# Patient Record
Sex: Female | Born: 1985
Health system: Southern US, Community
[De-identification: ages and names within clinical notes are randomized; demographics above are authoritative.]

## PROBLEM LIST (undated history)

## (undated) ENCOUNTER — Inpatient Hospital Stay (HOSPITAL_COMMUNITY): Payer: Self-pay

## (undated) DIAGNOSIS — Z9889 Other specified postprocedural states: Secondary | ICD-10-CM

## (undated) DIAGNOSIS — R112 Nausea with vomiting, unspecified: Secondary | ICD-10-CM

## (undated) DIAGNOSIS — K219 Gastro-esophageal reflux disease without esophagitis: Secondary | ICD-10-CM

## (undated) DIAGNOSIS — Z9289 Personal history of other medical treatment: Secondary | ICD-10-CM

## (undated) DIAGNOSIS — O1420 HELLP syndrome (HELLP), unspecified trimester: Secondary | ICD-10-CM

## (undated) DIAGNOSIS — R519 Headache, unspecified: Secondary | ICD-10-CM

## (undated) DIAGNOSIS — R51 Headache: Secondary | ICD-10-CM

## (undated) HISTORY — DX: Gastro-esophageal reflux disease without esophagitis: K21.9

---

## 2012-08-23 ENCOUNTER — Emergency Department (HOSPITAL_COMMUNITY)
Admission: EM | Admit: 2012-08-23 | Discharge: 2012-08-23 | Disposition: A | Discharge status: Home / Self Care | Payer: Self-pay | Attending: Emergency Medicine | Admitting: Emergency Medicine

## 2012-08-23 ENCOUNTER — Emergency Department (HOSPITAL_COMMUNITY): Payer: Self-pay

## 2012-08-23 ENCOUNTER — Encounter (HOSPITAL_COMMUNITY): Payer: Self-pay | Admitting: Family Medicine

## 2012-08-23 DIAGNOSIS — R1013 Epigastric pain: Secondary | ICD-10-CM

## 2012-08-23 DIAGNOSIS — K760 Fatty (change of) liver, not elsewhere classified: Secondary | ICD-10-CM

## 2012-08-23 DIAGNOSIS — I10 Essential (primary) hypertension: Secondary | ICD-10-CM | POA: Insufficient documentation

## 2012-08-23 DIAGNOSIS — K7689 Other specified diseases of liver: Secondary | ICD-10-CM | POA: Insufficient documentation

## 2012-08-23 LAB — URINALYSIS, ROUTINE W REFLEX MICROSCOPIC
Bilirubin Urine: NEGATIVE
Glucose, UA: NEGATIVE mg/dL
Ketones, ur: NEGATIVE mg/dL
Nitrite: NEGATIVE
Protein, ur: NEGATIVE mg/dL
Specific Gravity, Urine: 1.02 (ref 1.005–1.030)
Urobilinogen, UA: 1 mg/dL (ref 0.0–1.0)
pH: 6 (ref 5.0–8.0)

## 2012-08-23 LAB — COMPREHENSIVE METABOLIC PANEL
ALT: 120 U/L — ABNORMAL HIGH (ref 0–35)
AST: 72 U/L — ABNORMAL HIGH (ref 0–37)
Albumin: 4.2 g/dL (ref 3.5–5.2)
Alkaline Phosphatase: 77 U/L (ref 39–117)
BUN: 9 mg/dL (ref 6–23)
CO2: 26 mEq/L (ref 19–32)
Calcium: 9.5 mg/dL (ref 8.4–10.5)
Chloride: 105 mEq/L (ref 96–112)
Creatinine, Ser: 0.56 mg/dL (ref 0.50–1.10)
GFR calc Af Amer: >90 mL/min (ref >90)
GFR calc non Af Amer: >90 mL/min (ref >90)
Glucose, Bld: 77 mg/dL (ref 70–99)
Potassium: 3.5 mEq/L (ref 3.5–5.1)
Sodium: 141 mEq/L (ref 135–145)
Total Bilirubin: 0.7 mg/dL (ref 0.3–1.2)
Total Protein: 7.9 g/dL (ref 6.0–8.3)

## 2012-08-23 LAB — CBC WITH DIFFERENTIAL/PLATELET
Basophils Absolute: 0.1 10*3/uL (ref 0.0–0.1)
Basophils Relative: 1 % (ref 0–1)
Eosinophils Absolute: 0.4 10*3/uL (ref 0.0–0.7)
Eosinophils Relative: 5 % (ref 0–5)
HCT: 42.1 % (ref 36.0–46.0)
Hemoglobin: 15.2 g/dL — ABNORMAL HIGH (ref 12.0–15.0)
Lymphocytes Relative: 37 % (ref 12–46)
Lymphs Abs: 2.7 10*3/uL (ref 0.7–4.0)
MCH: 31.9 pg (ref 26.0–34.0)
MCHC: 36.1 g/dL — ABNORMAL HIGH (ref 30.0–36.0)
MCV: 88.4 fL (ref 78.0–100.0)
Monocytes Absolute: 0.4 10*3/uL (ref 0.1–1.0)
Monocytes Relative: 5 % (ref 3–12)
Neutro Abs: 3.8 10*3/uL (ref 1.7–7.7)
Neutrophils Relative %: 52 % (ref 43–77)
Platelets: 304 10*3/uL (ref 150–400)
RBC: 4.76 MIL/uL (ref 3.87–5.11)
RDW: 12.8 % (ref 11.5–15.5)
WBC: 7.2 10*3/uL (ref 4.0–10.5)

## 2012-08-23 LAB — URINE MICROSCOPIC-ADD ON

## 2012-08-23 LAB — PREGNANCY, URINE: Preg Test, Ur: NEGATIVE

## 2012-08-23 LAB — LIPASE, BLOOD: Lipase: 33 U/L (ref 11–59)

## 2012-08-23 MED ORDER — TRIAMCINOLONE ACETONIDE 0.1 % EX CREA: TOPICAL_CREAM | Freq: Two times a day (BID) | CUTANEOUS | Status: AC

## 2012-08-23 MED ORDER — ONDANSETRON HCL 4 MG/2ML IJ SOLN
4.0000 mg | Freq: Once | INTRAMUSCULAR | Status: AC
  Administered 2012-08-23: 4 mg via INTRAVENOUS
  Filled 2012-08-23: qty 2

## 2012-08-23 MED ORDER — SUCRALFATE 1 G PO TABS: 1.0000 g | ORAL_TABLET | Freq: Four times a day (QID) | ORAL | Status: DC

## 2012-08-23 MED ORDER — FAMOTIDINE IN NACL 20-0.9 MG/50ML-% IV SOLN
20.0000 mg | Freq: Once | INTRAVENOUS | Status: AC
  Administered 2012-08-23: 20 mg via INTRAVENOUS
  Filled 2012-08-23: qty 50

## 2012-08-23 MED ORDER — OMEPRAZOLE 20 MG PO CPDR: 20.0000 mg | DELAYED_RELEASE_CAPSULE | Freq: Every day | ORAL | Status: DC

## 2012-08-23 MED ORDER — GI COCKTAIL ~~LOC~~
30.0000 mL | Freq: Once | ORAL | Status: AC
  Administered 2012-08-23: 30 mL via ORAL
  Filled 2012-08-23: qty 30

## 2012-08-23 NOTE — ED Notes (Signed)
Pt husband at bedside

## 2012-08-23 NOTE — ED Notes (Signed)
Pt complaining of upper abdominal and epigastric pain with N,V. sts every time she eats it comes back up.

## 2012-08-23 NOTE — ED Provider Notes (Signed)
Medical screening examination/treatment/procedure(s) were performed by non-physician practitioner and as supervising physician I was immediately available for consultation/collaboration.   Carleene Cooper III, MD 08/23/12 [phone]

## 2012-08-23 NOTE — ED Notes (Signed)
Pt amb to BR without problem for specimen

## 2012-08-23 NOTE — ED Provider Notes (Signed)
History     CSN: [id number]  Arrival date & time 08/23/12  1123   First MD Initiated Contact with Patient 08/23/12 1139      Chief Complaint  Patient presents with  . Abdominal Pain    (Consider location/radiation/quality/duration/timing/severity/associated sxs/prior treatment) Patient is a 26 y.o. female presenting with abdominal pain. The history is provided by the patient.  Abdominal Pain The primary symptoms of the illness include abdominal pain, nausea and vomiting. The primary symptoms of the illness do not include fever, shortness of breath, diarrhea, hematemesis, dysuria or vaginal discharge. The current episode started more than 2 days ago. The problem has been gradually worsening.  Symptoms associated with the illness do not include chills, hematuria or back pain. Significant associated medical issues include GERD.  PT states she has hx of GERD, has been taking H2 blockers, states that pain worsened in the last week. States medications not helping. Pain worsened with eating, pain in epigastric region. Associated nausea, and vomiting. No problems with bowels. No fever. No other complaints   Past Medical History  Diagnosis Date  . Hypertension     History reviewed. No pertinent past surgical history.  History reviewed. No pertinent family history.  History  Substance Use Topics  . Smoking status: Never Smoker   . Smokeless tobacco: Not on file  . Alcohol Use: No    OB History    Grav Para Term Preterm Abortions TAB SAB Ect Mult Living                  Review of Systems  Constitutional: Negative for fever and chills.  HENT: Negative for neck pain and neck stiffness.   Respiratory: Negative for chest tightness and shortness of breath.   Cardiovascular: Negative for chest pain and leg swelling.  Gastrointestinal: Positive for nausea, vomiting and abdominal pain. Negative for diarrhea and hematemesis.  Genitourinary: Negative for dysuria, hematuria, flank pain and  vaginal discharge.  Musculoskeletal: Negative for myalgias and back pain.  Skin: Negative.   Neurological: Negative for dizziness, weakness and numbness.    Allergies  Review of patient's allergies indicates no known allergies.  Home Medications  No current outpatient prescriptions on file.  BP 146/105  Pulse 108  Temp 98.3 F (36.8 C) (Oral)  Resp 18  SpO2 98%  LMP 08/23/2012  Physical Exam  Nursing note and vitals reviewed. Constitutional: She is oriented to person, place, and time. She appears well-developed and well-nourished. No distress.  HENT:  Head: Normocephalic.  Eyes: Conjunctivae normal are normal.  Neck: Neck supple.  Cardiovascular: Normal rate, regular rhythm and normal heart sounds.   Pulmonary/Chest: Effort normal and breath sounds normal. No respiratory distress. She has no wheezes.  Abdominal: Soft. Bowel sounds are normal. She exhibits no distension. There is tenderness. There is no rebound and no guarding.       RUQ and epigastric tenderness  Musculoskeletal: She exhibits no edema.  Neurological: She is alert and oriented to person, place, and time.  Skin: Skin is warm and dry.       About 4cm area erythema, scaling to the right forearm  Psychiatric: She has a normal mood and affect.    ED Course  Procedures (including critical care time)  Pt with epigastric pain for "months" worsened in last week. Will try GI cocktail, pepcid, labs pending.   Results for orders placed during the hospital encounter of 08/23/12  CBC WITH DIFFERENTIAL      Component Value Range  WBC 7.2  4.0 - 10.5 K/uL   RBC 4.76  3.87 - 5.11 MIL/uL   Hemoglobin 15.2 (*) 12.0 - 15.0 g/dL   HCT 16.1  09.6 - 04.5 %   MCV 88.4  78.0 - 100.0 fL   MCH 31.9  26.0 - 34.0 pg   MCHC 36.1 (*) 30.0 - 36.0 g/dL   RDW 40.9  81.1 - 91.4 %   Platelets 304  150 - 400 K/uL   Neutrophils Relative 52  43 - 77 %   Neutro Abs 3.8  1.7 - 7.7 K/uL   Lymphocytes Relative 37  12 - 46 %   Lymphs  Abs 2.7  0.7 - 4.0 K/uL   Monocytes Relative 5  3 - 12 %   Monocytes Absolute 0.4  0.1 - 1.0 K/uL   Eosinophils Relative 5  0 - 5 %   Eosinophils Absolute 0.4  0.0 - 0.7 K/uL   Basophils Relative 1  0 - 1 %   Basophils Absolute 0.1  0.0 - 0.1 K/uL  COMPREHENSIVE METABOLIC PANEL      Component Value Range   Sodium 141  135 - 145 mEq/L   Potassium 3.5  3.5 - 5.1 mEq/L   Chloride 105  96 - 112 mEq/L   CO2 26  19 - 32 mEq/L   Glucose, Bld 77  70 - 99 mg/dL   BUN 9  6 - 23 mg/dL   Creatinine, Ser 7.82  0.50 - 1.10 mg/dL   Calcium 9.5  8.4 - 95.6 mg/dL   Total Protein 7.9  6.0 - 8.3 g/dL   Albumin 4.2  3.5 - 5.2 g/dL   AST 72 (*) 0 - 37 U/L   ALT 120 (*) 0 - 35 U/L   Alkaline Phosphatase 77  39 - 117 U/L   Total Bilirubin 0.7  0.3 - 1.2 mg/dL   GFR calc non Af Amer >90  >90 mL/min   GFR calc Af Amer >90  >90 mL/min  LIPASE, BLOOD      Component Value Range   Lipase 33  11 - 59 U/L  URINALYSIS, ROUTINE W REFLEX MICROSCOPIC      Component Value Range   Color, Urine AMBER (*) YELLOW   APPearance CLOUDY (*) CLEAR   Specific Gravity, Urine 1.020  1.005 - 1.030   pH 6.0  5.0 - 8.0   Glucose, UA NEGATIVE  NEGATIVE mg/dL   Hgb urine dipstick LARGE (*) NEGATIVE   Bilirubin Urine NEGATIVE  NEGATIVE   Ketones, ur NEGATIVE  NEGATIVE mg/dL   Protein, ur NEGATIVE  NEGATIVE mg/dL   Urobilinogen, UA 1.0  0.0 - 1.0 mg/dL   Nitrite NEGATIVE  NEGATIVE   Leukocytes, UA SMALL (*) NEGATIVE  PREGNANCY, URINE      Component Value Range   Preg Test, Ur NEGATIVE  NEGATIVE  URINE MICROSCOPIC-ADD ON      Component Value Range   Squamous Epithelial / LPF RARE  RARE   WBC, UA 3-6  <3 WBC/hpf   RBC / HPF TOO NUMEROUS TO COUNT  <3 RBC/hpf   US Abdomen Complete  08/23/2012  *RADIOLOGY REPORT*  Clinical Data:  Abdominal pain.  The patient ate approximately 3 hours prior to imaging.  COMPLETE ABDOMINAL ULTRASOUND  Comparison:  None.  Findings:  Gallbladder:  Slightly contracted due to the recent meal.  No  shadowing gallstones or echogenic sludge.  No gallbladder wall thickening or pericholecystic fluid.  Negative sonographic Murphy's sign according to  the ultrasound technologist.  Common bile duct:  Normal in caliber with maximum diameter approximating 3 mm.  Liver:  Diffusely increased and coarsened echotexture with focal sparing surrounding the gallbladder.  No focal parenchymal masses. Patent portal vein with hepatopetal flow.  IVC:  Patent.  Pancreas:  Although the pancreas is difficult to visualize in its entirety, no focal pancreatic abnormality is identified.  The head was partially obscured by duodenal bowel gas.  Spleen:  Normal size and echotexture without focal parenchymal abnormality.  Right Kidney:  No hydronephrosis.  Well-preserved cortex.  No shadowing calculi.  Normal size and parenchymal echotexture without focal abnormalities.  Approximately 13.2 cm in length.  Left Kidney:  No hydronephrosis.  Well-preserved cortex.  No shadowing calculi.  Normal size and parenchymal echotexture without focal abnormalities.  Approximately 12.2 cm in length.  Abdominal aorta:  Normal in caliber throughout its visualized course in the abdomen without significant atherosclerosis.  The bifurcation was not visualized due to overlying bowel gas.  Other findings:  Minimal perihepatic ascites.  IMPRESSION:  1.  Diffuse hepatic steatosis with focal sparing surrounding the gallbladder.  No focal hepatic parenchymal masses. 2.  Minimal perihepatic ascites. 3.  Otherwise normal examination with a caveat that the pancreatic head and the aortic bifurcation were obscured by overlying bowel gas and was therefore not evaluated.   Original Report Authenticated By: Arnell Sieving, M.D.     2:40 PM Pt's pain improved with GI cocktail and pepcid. US showed diffusse hepatic steatosis. Liver function slightly up. Pt non toxic, nad, pt is to follow up with PCP for further evaluation of possible liver problems which could be the  cause of her symptoms.   Filed Vitals:   08/23/12 1315  BP: 133/60  Pulse: 79  Temp:   Resp:      1. Hepatic steatosis   2. Epigastric pain       MDM          Lottie Mussel, PA 08/23/12 1557

## 2014-07-08 ENCOUNTER — Encounter: Payer: Self-pay | Admitting: Obstetrics & Gynecology

## 2014-07-08 ENCOUNTER — Ambulatory Visit (INDEPENDENT_AMBULATORY_CARE_PROVIDER_SITE_OTHER): Payer: Self-pay | Admitting: *Deleted

## 2014-07-08 DIAGNOSIS — Z3201 Encounter for pregnancy test, result positive: Secondary | ICD-10-CM

## 2014-07-09 ENCOUNTER — Encounter: Payer: Self-pay | Admitting: *Deleted

## 2014-07-09 NOTE — Progress Notes (Signed)
Pt given letter of confirmation of positive urine pregnancy test and [facility]. Pt voiced understanding.

## 2014-07-15 ENCOUNTER — Other Ambulatory Visit (HOSPITAL_COMMUNITY): Payer: Self-pay | Admitting: Nurse Practitioner

## 2014-07-15 DIAGNOSIS — Z3682 Encounter for antenatal screening for nuchal translucency: Secondary | ICD-10-CM

## 2014-07-15 LAB — OB RESULTS CONSOLE ABO/RH: RH Type: POSITIVE

## 2014-07-15 LAB — OB RESULTS CONSOLE RUBELLA ANTIBODY, IGM: Rubella: IMMUNE

## 2014-07-15 LAB — OB RESULTS CONSOLE HGB/HCT, BLOOD
HCT: 40 %
Hemoglobin: 13.4 g/dL

## 2014-07-15 LAB — OB RESULTS CONSOLE HEPATITIS B SURFACE ANTIGEN: Hepatitis B Surface Ag: NEGATIVE

## 2014-07-15 LAB — HEMOGLOBINOPATHY EVALUATION: Hemoglobinopathy Profile: NEGATIVE

## 2014-07-15 LAB — OB RESULTS CONSOLE HIV ANTIBODY (ROUTINE TESTING): HIV: NONREACTIVE

## 2014-07-15 LAB — GLUCOSE TOLERANCE, 1 HOUR: Glucose, GTT - 1 Hour: 129 mg/dL (ref ?–200)

## 2014-07-15 LAB — OB RESULTS CONSOLE PLATELET COUNT: Platelets: 330 10*3/uL

## 2014-07-15 LAB — OB RESULTS CONSOLE GC/CHLAMYDIA
Chlamydia: NEGATIVE
Gonorrhea: NEGATIVE

## 2014-07-15 LAB — OB RESULTS CONSOLE RPR: RPR: NONREACTIVE

## 2014-07-15 LAB — CYTOLOGY - PAP: Pap: NEGATIVE

## 2014-07-15 LAB — CULTURE, OB URINE: Urine Culture, OB: NEGATIVE

## 2014-07-15 LAB — OB RESULTS CONSOLE VARICELLA ZOSTER ANTIBODY, IGG: Varicella: NON-IMMUNE/NOT IMMUNE

## 2014-07-15 LAB — SICKLE CELL SCREEN: Sickle Cell Screen: NEGATIVE

## 2014-07-15 LAB — OB RESULTS CONSOLE ANTIBODY SCREEN: Antibody Screen: NEGATIVE

## 2014-07-15 LAB — CYSTIC FIBROSIS DIAGNOSTIC STUDY

## 2014-07-29 ENCOUNTER — Encounter: Payer: Self-pay | Admitting: General Practice

## 2014-08-04 ENCOUNTER — Encounter: Payer: Self-pay | Admitting: *Deleted

## 2014-08-05 ENCOUNTER — Encounter: Payer: Self-pay | Admitting: Obstetrics & Gynecology

## 2014-08-05 ENCOUNTER — Ambulatory Visit (INDEPENDENT_AMBULATORY_CARE_PROVIDER_SITE_OTHER): Payer: Self-pay | Admitting: Obstetrics & Gynecology

## 2014-08-05 VITALS — BP 121/81 | HR 76 | Temp 98.0°F | Wt 171.6 lb

## 2014-08-05 DIAGNOSIS — O0990 Supervision of high risk pregnancy, unspecified, unspecified trimester: Secondary | ICD-10-CM | POA: Insufficient documentation

## 2014-08-05 DIAGNOSIS — O34219 Maternal care for unspecified type scar from previous cesarean delivery: Secondary | ICD-10-CM | POA: Insufficient documentation

## 2014-08-05 DIAGNOSIS — O10911 Unspecified pre-existing hypertension complicating pregnancy, first trimester: Secondary | ICD-10-CM

## 2014-08-05 DIAGNOSIS — O10019 Pre-existing essential hypertension complicating pregnancy, unspecified trimester: Secondary | ICD-10-CM

## 2014-08-05 LAB — US OB COMP LESS 14 WKS

## 2014-08-05 LAB — POCT URINALYSIS DIP (DEVICE)
Bilirubin Urine: NEGATIVE
Glucose, UA: NEGATIVE mg/dL
Hgb urine dipstick: NEGATIVE
Ketones, ur: NEGATIVE mg/dL
Leukocytes, UA: NEGATIVE
Nitrite: NEGATIVE
Protein, ur: NEGATIVE mg/dL
Specific Gravity, Urine: 1.01 (ref 1.005–1.030)
Urobilinogen, UA: 0.2 mg/dL (ref 0.0–1.0)
pH: 7 (ref 5.0–8.0)

## 2014-08-05 MED ORDER — ASPIRIN EC 81 MG PO TBEC: 81.0000 mg | DELAYED_RELEASE_TABLET | Freq: Every day | ORAL | Status: DC

## 2014-08-05 NOTE — Patient Instructions (Addendum)
Hipertensin durante el embarazo (Hypertension During Pregnancy) Cuando se sufre hipertensin arterial o presin arterial alta, existe una presin extra en el interior de los vasos sanguneos que llevan la sangre desde el corazn al resto del cuerpo (arterias). Esto puede suceder en cualquier etapa de la vida, incluido el embarazo. La hipertensin durante el embarazo puede causar problemas para usted y el beb. Es posible que el beb no tenga el peso adecuado al nacer o puede que nazca antes de tiempo (prematuro). En los casos muy graves de hipertensin durante el embarazo puede estar en peligro la vida.  Durante el embarazo se pueden presentar diferentes tipos de hipertensin arterial. Estos incluyen:  Hipertensin crnica. Esto sucede cuando una mujer sufre de hipertensin arterial antes del embarazo y contina durante el este.  Hipertensin gestacional. Es cuando se desarrolla la hipertensin durante el embarazo.  Preeclampsia o toxemia del embarazo. Es un tipo muy grave de hipertensin que se desarrolla solo durante el embarazo. Afecta a todo el cuerpo y puede ser muy peligrosa para la madre y el beb. La hipertensin gestacional y preeclampsia por lo general, desaparecen despus del nacimiento del beb. La presin arterial probablemente se estabilizar en un perodo de 6 semanas. Las mujeres que sufren de hipertensin durante el embarazo tienen una mayor probabilidad de desarrollar hipertensin en etapas posteriores de la vida o en embarazos futuros. FACTORES DE RIESGO Existen ciertos factores que aumentan las probabilidades de que desarrolle hipertensin durante el embarazo. Estos incluyen:  Tener hipertensin arterial antes del embarazo.  Haber sufrido hipertensin arterial durante un embarazo anterior.  Tener sobrepeso.  Ser mayor de 40 aos.  Estar embarazada de ms de un beb.  Tener diabetes o problemas renales. SIGNOS Y SNTOMAS La hipertensin arterial gestacional y crnica en  raras ocasiones provoca sntomas. La preeclampsia causa sntomas, que pueden ser:  Aumento de las protenas en la orina. El mdico la controlar en cada visita prenatal.  Hinchazn de las manos y la cara.  Aumento rpido de peso.  Dolores de cabeza.  Cambios en la visin.  Molestias al ver la luz.  Dolor abdominal, especialmente en el rea superior derecha.  Dolor en el pecho.  Falta de aire.  Aumento de los reflejos.  Convulsiones. Las convulsiones ocurren en una forma ms grave de preeclampsia, llamada eclampsia. DIAGNSTICO  Es posible que se le diagnostique hipertensin arterial durante un control prenatal regular. En cada visita prenatal, es posible que le realicen los siguientes exmenes:  Control de la presin arterial.  Anlisis de orina para detectar protenas en la orina. El tipo de hipertensin que se diagnostica depende del momento en que se desarroll. Tambin depende de la lectura de su presin arterial especfica.  El desarrollo de hipertensin arterial antes de lasemana 20 de embarazo es consecuente con la hipertensin arterial crnica.  El desarrollo de hipertensin arterial despus de la semana 20 de embarazo es consecuente con la hipertensin gestacional.  Hipertensin con aumento de la protena urinaria se diagnostica como preeclampsia.  Las mediciones de la presin arterial de ms de 160 sistlica o 110 diastlica son un signo de preeclampsia grave. TRATAMIENTO El tratamiento para la hipertensin durante el embarazo vara. Depende del tipo de hipertensin y de su gravedad.  Si toma medicamentos para la hipertensin crnica, puede que tenga que cambiarlos.  Los medicamentos llamados inhibidores de la ECA no deben tomarse durante el embarazo.  Para las mujeres que tienen factores de riesgo de preeclampsia pueden recomendarse bajas dosis de aspirina.  Si usted tiene   hipertensin gestacional, tendr que tomar un medicamento para la presin arterial que  sea seguro durante el embarazo. El Market researcher el medicamento apropiado.  Si tiene preeclampsia grave, es posible que tenga que Engineer, maintenance hospital. Los mdicos la controlarn a usted y al beb muy de cerca. Probablemente deba tomar un medicamento denominado sulfato de magnesio para prevenir las convulsiones y reducir la presin arterial.  A veces es necesario inducir un parto prematuro. Por ejemplo, si la afeccin empeora. Se hace para protegerlos a usted y a su beb. La nica cura para la preeclampsia es el parto.  Su mdico puede recomendarle que tome una aspirina de dosis baja ( ) cada da, a fin de ayudar a prevenir la hipertensin durante el embarazo, si est en riesgo de padecer preeclampsia. Puede estar en riesgo de padecer preeclampsia si:  Padeci preeclampsia o eclampsia durante un embarazo anterior.  Su beb no creci segn lo previsto durante un embarazo anterior.  Tuvo un parto prematuro en un embarazo anterior.  Experiment una separacin de la placenta desde el tero (desprendimiento abrupto de la placenta) durante un embarazo anterior.  Perdi un beb en un embarazo anterior.  Est embarazada de ms de un beb.  Padece otras afecciones mdicas, como diabetes o una enfermedad autoinmunitaria. INSTRUCCIONES PARA EL CUIDADO EN EL HOGAR  Programe y concurra a todas las citas de control prenatal regulares. Esto es importante.  Tome los medicamentos solamente como se lo haya indicado el mdico. Dgale a su mdico todos los medicamentos que toma.  Consuma la menor cantidad posible de sal.  Realice actividad fsica con regularidad.  No beba alcohol.  No fume ni use productos que contengan tabaco.  No beba productos con cafena.  Acustese sobre el lado izquierdo cuando haga reposo. SOLICITE ATENCIN MDICA DE INMEDIATO SI:  Siente un dolor abdominal intenso.  Presenta hinchazn repentina en las manos, los tobillos o el rostro.  Aumenta ms de 4  libras (1,8 kg) en una semana.  Vomita repetidas veces.  Tiene una hemorragia vaginal abundante.  No siente que el beb se mueva mucho.  Tiene dolores de [place].  Tiene visin doble o borrosa.  Tiene calambres o espasmos musculares.  Le falta el aire.  Tiene los labios y las uas de los dedos de las manos de [employer].  Observa sangre en la orina. ASEGRESE DE QUE:  Comprende estas instrucciones.  Controlar su afeccin.  Recibir ayuda de inmediato si no mejora o si empeora. Document Released: 11/15/2011 Document Revised: 04/12/2014 [facility] Patient Information 2015 [place], [place]. This information is not intended to replace advice given to you by your health care provider. Make sure you discuss any questions you have with your health care provider.   Parto vaginal despus de Eustace Quail (Vaginal Birth After Cesarean Delivery) Un parto vaginal despus de un parto por cesrea es dar a luz por la vagina despus de haber dado a luz por medio de una intervencin [place]. En el pasado, si una mujer tena un beb por cesrea, todos los partos posteriores deban hacerse por cesrea. Esto ya no es as. Puede ser seguro para la mam intentar un parto vaginal luego de una cesrea.  Es importante que converse con su mdico desde comienzos del Psychiatrist de modo que pueda [employer], beneficios y opciones. Le dar tiempo para decidir qu es lo mejor en su caso particular. La decisin final de tener un parto vaginal o por cesrea debe tomarse en conjunto, entre usted y [place]  mdico. Cualquier cambio en su salud o la de su beb durante el embarazo puede ser motivo de un cambio de decisin respecto del parto vaginal.  LAS MUJERES QUE OPTAN POR EL PARTO VAGINAL, DEBEN CONSULTAR AL MDICO PARA ASEGURARSE DE QUE:  La cesrea anterior se haya realizado con un corte (incisin) uterino transversal (no con una incisin vertical clsica).  El canal de parto es lo suficientemente grande  como para que pase el [place].  No ha sido sometida a otras operaciones del tero.  Durante el trabajo de parto, le realizarn un monitoreo fetal Forensic scientist, en todo momento.  Habr un quirfano disponible y listo en caso de necesitar una cesrea de emergencia.  Un mdico y personal de quirfano estarn disponibles en todo momento durante el [place] de parto, para realizar una cesrea en caso de ser necesario.  Habr un anestesista disponible en caso de necesitar una cesrea de emergencia.  La nursery est lista y cuenta con personal especializado y el equipo disponible para cuidar al beb en caso de emergencia. BENEFICIOS DEL PARTO VAGINAL:  Permanencia ms breve en el hospital.  Prevencin de los riesgos asociados con el parto por cesrea, por ejemplo:  Complicaciones quirrgicas, como apertura o hernia de la incisin.  Lesiones en otros rganos.  Grant Ruts. Esto puede ocurrir si aparece una infeccin despus de la ciruga. Tambin puede ocurrir como reaccin a los medicamentos administrados para adormecerla durante la [place].  Menos prdida de sangre y menos probabilidad de necesitar una transfusin sangunea.  Menor riesgo de cogulos sanguneos e infeccin.  Tiempo ms corto de recuperacin.  Menor riesgo de remocin del tero (histerectoma).  Menor riesgo de que la placenta cubra parcial o completamente la abertura del tero (placenta previa) en embarazos futuros.  Menos riesgos en el [place] de parto y [place] futuros. RIESGOS  Ruptura del tero. Esto ocurre en menos del 1% de los partos vaginales. El riesgo de que eso suceda es mayor si:  Se toman medidas para iniciar el proceso del [place] de parto (inducir Engineer, manufacturing systems) o Risk manager o intensificar las contracciones (aumentar el trabajo de [place]).  Se usan medicamentos para ablandar (madurar) el cuello del tero.  Es necesario extraer el tero (histerectoma) si se rompe. No debe llevarse a cabo si:  La cesrea  previa se realiz con una incisin vertical (clsica) o con forma de T, o usted no sabe cul de [employer] han practicado.  Ha sufrido ruptura del tero.  Ha tenido ciertos tipos de Leisure centre manager en el tero, como la extirpacin de fibromas uterinos. Pregntele a su mdico sobre otros tipos de cirugas que le impiden tener un parto vaginal.  Tiene ciertos problemas mdicos o relacionados con el parto (obsttricos).  El beb est en problemas.  Tuvo dos cesreas previas y ningn parto vaginal. OTRAS COSAS QUE DEBE SABER:  La anestesia peridural es segura.  Es seguro dar vuelta al beb si se encuentra de nalgas (intentar una versin ceflica externa).  Es seguro intentarlo en caso de mellizos.  El parto vaginal puede no ser apropiado si el beb pesa 8,8lb (4kg) o ms. Sin embargo, las predicciones de [place] no son siempre exactas y no deben ser lo nico a tenerse en cuenta para decidir si el parto vaginal es lo indicado para usted.  Hay aumento en el porcentaje de fracasos si el intervalo entre la cesrea y el parto vaginal es de menos de 19 meses.  Su mdico puede aconsejarle no tener un parto vaginal si tiene preeclampsia (  hipertensin, protena en la orina e hinchazn en la cara y las extremidades).  El parto vaginal suele ser exitoso si ya tuvo un parto vaginal previamente.  Tambin suele ser exitoso cuando el trabajo de parto comienza espontneamente antes de la fecha.  El parto vaginal despus de Eustace Quail es similar a un parto espontneo vaginal normal. Document Released: 05/14/2008 Document Revised: 09/16/2013 [employer] Patient Information 2015 [place], [place]. This information is not intended to replace advice given to you by your health care provider. Make sure you discuss any questions you have with your health care provider.   Informacin sobre la prueba de [place] de parto despus de Neomia Dear cesrea (Trial of Labor After Cesarean Delivery Information) La prueba de [place] de parto  despus de una cesrea (TOLAC por sus siglas en ingls) es cuando una mujer trata de dar a luz por va vaginal despus de una cesrea anterior. La TOLAC puede ser Neomia Dear opcin segura y [place] segn la historia clnica y otros factores de [place]. Cuando TOLAC tiene xito y es capaz de tener un parto vaginal, esto se llama un parto vaginal despus de Neomia Dear cesrea (PVDC).  CANDIDATAS PARA TOLAC Este procedimiento es posible para algunas mujeres que:  Hayan sido sometidas a uno o dos partos por cesrea en los que la incisin del tero fue horizontal (transversal baja).  Estn esperando gemelos y tuvieron una incisin transversal baja durante una cesrea.  No tienen una cicatriz uterina vertical (clsica).  No han tenido una ruptura en la pared del tero (ruptura uterina). El TOLAC tambin puede intentarse en mujeres que cumplen con los criterios apropiados:  Son menores de [address].  Son altas y tienen un ndice de masa corporal [facility]) inferior a 30.  . Fredderick Phenix cicatriz uterina desconocida.  Dan a luz en un centro equipado para realizar un parto por cesrea de emergencia. Este equipo debe ser capaz de Company secretary las posibles complicaciones, como una ruptura uterina.  Tienen asesoramiento completo [employer] beneficios y riesgos del TOLAC.  Han comentado acerca de futuros planes de embarazo con su mdico.  Han planificado tener varios embarazos ms. CANDIDATAS A TENER MS XITO EN TOLAC:  Han tenido un parto vaginal exitoso antes o despus de su parto por cesrea.  Experimentan un trabajo de parto que comienza, naturalmente, en o antes de la fecha estimada (40 semanas de gestacin).  El beb no es muy grande ( macrosmico).   Han tenido un parto por cesrea anterior, pero actualmente no hay factores que promoveran un parto por cesrea (como una posicin de [place]).  Han tenido un solo parto por cesrea anteriormente.  Tuvo un parto por cesrea antes de realizar el [place] de parto y  no despus de Teacher, English as a foreign language. TOLAC puede ser ms apropiado para mujeres que cumplen con las normas anteriores y que planean tener ms embarazos. No se recomienda en partos domiciliarios. CANDIDATAS A TENER MENOS XITO EN TOLAC:  Tienen un parto inducido con un cuello uterino desfavorable. Un cuello uterino desfavorable es cuando no se dilata lo suficiente (entre otros factores).  Nunca han tenido un parto vaginal.  Han tenido ms de dos partos por cesrea.  Tienen un embarazo de ms de 40 semanas de gestacin.  Est embarazada de un un beb con sospecha de un peso mayor de 4.000 gramos (8  libras) y no tiene antecedentes de un parto vaginal.  Han tenido embarazos muy cercanos. BENEFICIOS SUGERIDOS DE LA TOLAC  Tiempo de recuperacin ms rpido.  Permanencia ms  breve en el hospital.  Menos dolor y problemas que en un parto por cesrea. Las [employer] tienen un parto por cesrea tienen una mayor probabilidad de necesitar sangre o tener fiebre, una infeccin o un cogulo sanguneo en las piernas. RIESGOS SUGERIDOS DE LA TOLAC El riesgo ms alto de complicaciones ocurre en mujeres que intentan un TOLAC y fracasan. Una TOLAC que fracasa resulta en una cesrea no planificada. Los riesgos relacionados con TOLAC o cesreas repetidas son:   Lulu Riding.  Infeccin.  Cogulos sanguneos.  Lesiones en los rganos o tejidos circundantes.  Menor riesgo de remocin del tero (histerectoma).  Posibles problemas con la placenta (como placenta previa o placenta acreta) en embarazos futuros. Aunque es muy raro, las preocupaciones principales con el TOLAC son:  Ruptura de la cicatriz uterina de una cesrea anterior.  Necesidad de una cesrea de Associate Professor.  Tener un mal resultado para el beb (morbilidad perinatal). Irven Shelling MS INFORMACIN [employer] of Obstetricians and Gynecologists ([employer] de Obstetras y Gineclogos): www.[email or web address] [employer] Nurse-Midwives (Cole[employer] Estadoun[facility] de Enfermeras - parteras): [email or web address] Document Released: 11/15/2011 Document Revised: 09/16/2013 [facility] Patient Information 2015 [place], [place]. This information is not intended to replace advice given to you by your health care provider. Make sure you discuss any questions you have with your health care provider.

## 2014-08-05 NOTE — Progress Notes (Signed)
Bedside US for viability = FHR - 170 per PW doppler.  Dr. Macon Large notified

## 2014-08-05 NOTE — Progress Notes (Signed)
Transfer from [facility] for [facility], initial visit done there on 07/15/14, BP was 136/96.   Patient is Spanish-speaking only, Spanish interpreter present for this encounter. On Methyldopa 250 mg bid; was on Metoprolol before pregnancy  Baseline labs: will be drawn next visit. 24 hour urine collection supplies given, patient will bring back next visit and labs will be checked then.  Baby ASA prescribed today  Serial growth scans [id number]  Antenatal testing starting at 32 weeks  Delivery by 39 weeks or earlier if needed Discussed implications of CHTN in pregnancy, need for antenatal testing and frequent ultrasounds/prenatal visits, need for optimizing BP control to decrease CHTN/preeclampsia associated maternal-fetal morbidity and mortality.  Patient had previous cesarean section; reports having bleeding vaginally during, ?abruption, leading to emergency cesarean section. She is worried about this happening again. She was told this was not an indication for RCS; however, she can elect to have a RCS.  TOLAC vs RCS information sheet given to patient. NT scan already scheduled 08/10/14. Reassured about occasional hip pain. No other complaints or concerns.  Routine obstetric precautions reviewed.

## 2014-08-05 NOTE — Progress Notes (Signed)
C/o of occasional hip/side pain.  Up to date on lab work. New OB packet given to patient.

## 2014-08-10 ENCOUNTER — Other Ambulatory Visit (HOSPITAL_COMMUNITY): Payer: Medicaid Other

## 2014-08-10 ENCOUNTER — Ambulatory Visit (HOSPITAL_COMMUNITY): Payer: Medicaid Other

## 2014-08-11 ENCOUNTER — Ambulatory Visit (HOSPITAL_COMMUNITY): Payer: Medicaid Other

## 2014-08-11 ENCOUNTER — Other Ambulatory Visit (HOSPITAL_COMMUNITY): Payer: Medicaid Other

## 2014-08-17 ENCOUNTER — Encounter (HOSPITAL_COMMUNITY): Payer: Self-pay

## 2014-08-17 ENCOUNTER — Ambulatory Visit (HOSPITAL_COMMUNITY)
Admission: RE | Admit: 2014-08-17 | Discharge: 2014-08-17 | Disposition: A | Discharge status: Home / Self Care | Payer: Medicaid Other | Source: Ambulatory Visit | Attending: Nurse Practitioner | Admitting: Nurse Practitioner

## 2014-08-17 VITALS — HR 81 | Wt 173.0 lb

## 2014-08-17 DIAGNOSIS — O34219 Maternal care for unspecified type scar from previous cesarean delivery: Secondary | ICD-10-CM | POA: Diagnosis not present

## 2014-08-17 DIAGNOSIS — Z3682 Encounter for antenatal screening for nuchal translucency: Secondary | ICD-10-CM

## 2014-08-17 DIAGNOSIS — O10019 Pre-existing essential hypertension complicating pregnancy, unspecified trimester: Secondary | ICD-10-CM | POA: Diagnosis not present

## 2014-08-17 DIAGNOSIS — Z36 Encounter for antenatal screening of mother: Secondary | ICD-10-CM | POA: Diagnosis not present

## 2014-08-17 DIAGNOSIS — O10912 Unspecified pre-existing hypertension complicating pregnancy, second trimester: Secondary | ICD-10-CM

## 2014-08-18 ENCOUNTER — Encounter: Payer: Self-pay | Admitting: *Deleted

## 2014-08-18 ENCOUNTER — Encounter (HOSPITAL_COMMUNITY): Payer: Self-pay | Admitting: *Deleted

## 2014-08-18 ENCOUNTER — Inpatient Hospital Stay (HOSPITAL_COMMUNITY): Payer: Medicaid Other

## 2014-08-18 ENCOUNTER — Inpatient Hospital Stay (HOSPITAL_COMMUNITY)
Admission: AD | Admit: 2014-08-18 | Discharge: 2014-08-18 | Disposition: A | Discharge status: Home / Self Care | Payer: Medicaid Other | Source: Ambulatory Visit | Attending: Obstetrics & Gynecology | Admitting: Obstetrics & Gynecology

## 2014-08-18 DIAGNOSIS — O219 Vomiting of pregnancy, unspecified: Secondary | ICD-10-CM

## 2014-08-18 DIAGNOSIS — O99891 Other specified diseases and conditions complicating pregnancy: Secondary | ICD-10-CM | POA: Diagnosis not present

## 2014-08-18 DIAGNOSIS — O210 Mild hyperemesis gravidarum: Secondary | ICD-10-CM | POA: Diagnosis not present

## 2014-08-18 DIAGNOSIS — O10019 Pre-existing essential hypertension complicating pregnancy, unspecified trimester: Secondary | ICD-10-CM | POA: Insufficient documentation

## 2014-08-18 DIAGNOSIS — O26891 Other specified pregnancy related conditions, first trimester: Secondary | ICD-10-CM

## 2014-08-18 DIAGNOSIS — O10911 Unspecified pre-existing hypertension complicating pregnancy, first trimester: Secondary | ICD-10-CM

## 2014-08-18 DIAGNOSIS — R51 Headache: Secondary | ICD-10-CM | POA: Diagnosis present

## 2014-08-18 DIAGNOSIS — O9989 Other specified diseases and conditions complicating pregnancy, childbirth and the puerperium: Secondary | ICD-10-CM

## 2014-08-18 LAB — CBC
HCT: 37 % (ref 36.0–46.0)
Hemoglobin: 13.3 g/dL (ref 12.0–15.0)
MCH: 31.6 pg (ref 26.0–34.0)
MCHC: 35.9 g/dL (ref 30.0–36.0)
MCV: 87.9 fL (ref 78.0–100.0)
Platelets: 350 10*3/uL (ref 150–400)
RBC: 4.21 MIL/uL (ref 3.87–5.11)
RDW: 13.5 % (ref 11.5–15.5)
WBC: 7.5 10*3/uL (ref 4.0–10.5)

## 2014-08-18 LAB — URINALYSIS, ROUTINE W REFLEX MICROSCOPIC
Bilirubin Urine: NEGATIVE
Glucose, UA: NEGATIVE mg/dL
Ketones, ur: NEGATIVE mg/dL
Leukocytes, UA: NEGATIVE
Nitrite: NEGATIVE
Protein, ur: NEGATIVE mg/dL
Specific Gravity, Urine: <1.005 — ABNORMAL LOW (ref 1.005–1.030)
Urobilinogen, UA: 0.2 mg/dL (ref 0.0–1.0)
pH: 6 (ref 5.0–8.0)

## 2014-08-18 LAB — COMPREHENSIVE METABOLIC PANEL
ALT: 49 U/L — ABNORMAL HIGH (ref 0–35)
AST: 45 U/L — ABNORMAL HIGH (ref 0–37)
Albumin: 3.5 g/dL (ref 3.5–5.2)
Alkaline Phosphatase: 67 U/L (ref 39–117)
Anion gap: 13 (ref 5–15)
BUN: 6 mg/dL (ref 6–23)
CO2: 23 mEq/L (ref 19–32)
Calcium: 9.1 mg/dL (ref 8.4–10.5)
Chloride: 102 mEq/L (ref 96–112)
Creatinine, Ser: 0.37 mg/dL — ABNORMAL LOW (ref 0.50–1.10)
GFR calc Af Amer: >90 mL/min (ref >90)
GFR calc non Af Amer: >90 mL/min (ref >90)
Glucose, Bld: 80 mg/dL (ref 70–99)
Potassium: 4 mEq/L (ref 3.7–5.3)
Sodium: 138 mEq/L (ref 137–147)
Total Bilirubin: 0.3 mg/dL (ref 0.3–1.2)
Total Protein: 7.3 g/dL (ref 6.0–8.3)

## 2014-08-18 LAB — URINE MICROSCOPIC-ADD ON

## 2014-08-18 LAB — URIC ACID: Uric Acid, Serum: 2.9 mg/dL (ref 2.4–7.0)

## 2014-08-18 LAB — LACTATE DEHYDROGENASE: LDH: 185 U/L (ref 94–250)

## 2014-08-18 MED ORDER — HYDROMORPHONE HCL PF 1 MG/ML IJ SOLN
1.0000 mg | Freq: Once | INTRAMUSCULAR | Status: AC
  Administered 2014-08-18: 1 mg via INTRAVENOUS
  Filled 2014-08-18: qty 1

## 2014-08-18 MED ORDER — PROMETHAZINE HCL 25 MG/ML IJ SOLN
12.5000 mg | Freq: Once | INTRAMUSCULAR | Status: AC
  Administered 2014-08-18: 12.5 mg via INTRAVENOUS
  Filled 2014-08-18: qty 1

## 2014-08-18 MED ORDER — METHYLDOPA 500 MG PO TABS: 500.0000 mg | ORAL_TABLET | Freq: Two times a day (BID) | ORAL | Status: DC

## 2014-08-18 MED ORDER — LABETALOL HCL 5 MG/ML IV SOLN
10.0000 mg | Freq: Once | INTRAVENOUS | Status: AC
  Administered 2014-08-18: 10 mg via INTRAVENOUS

## 2014-08-18 MED ORDER — BUTALBITAL-APAP-CAFFEINE 50-325-40 MG PO TABS: 1.0000 | ORAL_TABLET | Freq: Four times a day (QID) | ORAL | Status: DC | PRN

## 2014-08-18 MED ORDER — PROMETHAZINE HCL 25 MG PO TABS: 25.0000 mg | ORAL_TABLET | Freq: Four times a day (QID) | ORAL | Status: DC | PRN

## 2014-08-18 MED ORDER — LACTATED RINGERS IV BOLUS (SEPSIS)
1000.0000 mL | Freq: Once | INTRAVENOUS | Status: AC
  Administered 2014-08-18: 1000 mL via INTRAVENOUS

## 2014-08-18 MED ORDER — FAMOTIDINE IN NACL 20-0.9 MG/50ML-% IV SOLN
20.0000 mg | Freq: Once | INTRAVENOUS | Status: AC
  Administered 2014-08-18: 20 mg via INTRAVENOUS
  Filled 2014-08-18: qty 50

## 2014-08-18 MED ORDER — LABETALOL HCL 5 MG/ML IV SOLN
20.0000 mg | Freq: Once | INTRAVENOUS | Status: DC
  Filled 2014-08-18: qty 4

## 2014-08-18 NOTE — MAU Provider Note (Signed)
History     CSN: [id number]  Arrival date and time: 08/18/14 1328   First Provider Initiated Contact with Patient 08/18/14 1430      Chief Complaint  Patient presents with  . Headache  . Hypertension   HPI  Jennifer Holmes is a 28 y.o. female G2P1001 at 14w5dwho presents who Headache. The headache came on yesterday, pt took 2 tylenol tabs and did not get any relief. The vomiting started around 1200 today; this is not a new symptom, the pt has had nausea throughout the pregnancy, but no vomiting. The headache is in the front of her head "on my forehead". She denies photophobia. Denies history of HA or migraines.  Pt ate breakfast and has not eaten since.  + ear pain with HA.   Spanish interpretor used for assessment.  OB History   Grav Para Term Preterm Abortions TAB SAB Ect Mult Living   2 1 1  0 0 0 0 0 0 1      Past Medical History  Diagnosis Date  . Hypertension   . GERD (gastroesophageal reflux disease)     Past Surgical History  Procedure Laterality Date  . Cesarean section      Family History  Problem Relation Age of Onset  . Hypertension Mother   . Heart disease Mother   . Hypertension Brother     History  Substance Use Topics  . Smoking status: Never Smoker   . Smokeless tobacco: Never Used  . Alcohol Use: No    Allergies:  Allergies  Allergen Reactions  . Lactose Intolerance (Gi)     Prescriptions prior to admission  Medication Sig Dispense Refill  . aspirin EC 81 MG tablet Take 1 tablet (81 mg total) by mouth daily. For prevention of preeclampsia  200 tablet  0  . methyldopa (ALDOMET) 250 MG tablet Take 250 mg by mouth 2 (two) times daily.      . Prenatal Multivit-Min-Fe-FA (PRENATAL VITAMINS PO) Take by mouth.      . ranitidine (ZANTAC) 150 MG tablet Take 150 mg by mouth 2 (two) times daily.      .Marland Kitchenomeprazole (PRILOSEC) 20 MG capsule Take 1 capsule (20 mg total) by mouth daily.  30 capsule  0  . sucralfate (CARAFATE) 1 G tablet  Take 1 tablet (1 g total) by mouth 4 (four) times daily.  60 tablet  0   Results for orders placed during the hospital encounter of 08/18/14 (from the past 48 hour(s))  URINALYSIS, ROUTINE W REFLEX MICROSCOPIC     Status: Abnormal   Collection Time    08/18/14  2:00 PM      Result Value Ref Range   Color, Urine YELLOW  YELLOW   APPearance CLEAR  CLEAR   Specific Gravity, Urine <1.005 (*) 1.005 - 1.030   pH 6.0  5.0 - 8.0   Glucose, UA NEGATIVE  NEGATIVE mg/dL   Hgb urine dipstick TRACE (*) NEGATIVE   Bilirubin Urine NEGATIVE  NEGATIVE   Ketones, ur NEGATIVE  NEGATIVE mg/dL   Protein, ur NEGATIVE  NEGATIVE mg/dL   Urobilinogen, UA 0.2  0.0 - 1.0 mg/dL   Nitrite NEGATIVE  NEGATIVE   Leukocytes, UA NEGATIVE  NEGATIVE  URINE MICROSCOPIC-ADD ON     Status: Abnormal   Collection Time    08/18/14  2:00 PM      Result Value Ref Range   Squamous Epithelial / LPF FEW (*) RARE   WBC, UA 0-2  <3  WBC/hpf   Bacteria, UA RARE  RARE  CBC     Status: None   Collection Time    08/18/14  2:49 PM      Result Value Ref Range   WBC 7.5  4.0 - 10.5 K/uL   RBC 4.21  3.87 - 5.11 MIL/uL   Hemoglobin 13.3  12.0 - 15.0 g/dL   HCT 37.0  36.0 - 46.0 %   MCV 87.9  78.0 - 100.0 fL   MCH 31.6  26.0 - 34.0 pg   MCHC 35.9  30.0 - 36.0 g/dL   RDW 13.5  11.5 - 15.5 %   Platelets 350  150 - 400 K/uL  COMPREHENSIVE METABOLIC PANEL     Status: Abnormal   Collection Time    08/18/14  2:49 PM      Result Value Ref Range   Sodium 138  137 - 147 mEq/L   Potassium 4.0  3.7 - 5.3 mEq/L   Chloride 102  96 - 112 mEq/L   CO2 23  19 - 32 mEq/L   Glucose, Bld 80  70 - 99 mg/dL   BUN 6  6 - 23 mg/dL   Creatinine, Ser 0.37 (*) 0.50 - 1.10 mg/dL   Calcium 9.1  8.4 - 10.5 mg/dL   Total Protein 7.3  6.0 - 8.3 g/dL   Albumin 3.5  3.5 - 5.2 g/dL   AST 45 (*) 0 - 37 U/L   ALT 49 (*) 0 - 35 U/L   Alkaline Phosphatase 67  39 - 117 U/L   Total Bilirubin 0.3  0.3 - 1.2 mg/dL   GFR calc non Af Amer >90  >90 mL/min   GFR  calc Af Amer >90  >90 mL/min   Comment: (NOTE)     The eGFR has been calculated using the CKD EPI equation.     This calculation has not been validated in all clinical situations.     eGFR's persistently <90 mL/min signify possible Chronic Kidney     Disease.   Anion gap 13  5 - 15   Comment: Performed at [facility] ACID     Status: None   Collection Time    08/18/14  2:49 PM      Result Value Ref Range   Uric Acid, Serum 2.9  2.4 - 7.0 mg/dL   Comment: Performed at [place] DEHYDROGENASE     Status: None   Collection Time    08/18/14  2:49 PM      Result Value Ref Range   LDH 185  94 - 250 U/L   Comment: Performed at [place] Contrast  08/18/2014   CLINICAL DATA:  Severe headache.  Nausea and vomiting.  EXAM: CT HEAD WITHOUT CONTRAST  TECHNIQUE: Contiguous axial images were obtained from the base of the skull through the vertex without intravenous contrast.  COMPARISON:  None.  FINDINGS: No evidence of intracranial hemorrhage, brain edema, or other signs of acute infarction. No evidence of intracranial mass lesion or mass effect. No abnormal extraaxial fluid collections identified. Ventricles are normal in size. No skull abnormality identified. Partial opacification of right ethmoid sinuses is noted.  IMPRESSION: No evidence of intracranial abnormality.  Right ethmoid sinusitis incidentally noted.   Electronically Signed   By: Earle Gell M.D.   On: 08/18/2014 15:52   [place] Mfm Fetal Nuchal Translucency  08/17/2014   OBSTETRICAL ULTRASOUND: This exam was  performed within a [facility]. The OB US report was generated in the AS system, and faxed to the ordering physician.   This report is available in the [employer]. See the AS Obstetric US report via the Image Link.   Review of Systems  Constitutional: Positive for chills. Negative for fever.  Eyes: Negative for blurred vision and photophobia.  Cardiovascular:  Negative for chest pain.  Neurological: Positive for dizziness, loss of consciousness (Pt states she passed out yesterday, unwitnessed. Pt felt dizzy and passed out for a few seconds) and headaches. Negative for speech change.   Physical Exam   Blood pressure 122/77, pulse 68, temperature 98.3 F (36.8 C), temperature source Oral, resp. rate 20, height 5' (1.524 m), weight 78.472 kg (173 lb), last menstrual period 05/14/2014.  Physical Exam  Constitutional: She is oriented to person, place, and time. She appears well-developed and well-nourished.  Non-toxic appearance. She has a sickly appearance. She appears ill. No distress.  HENT:  Right Ear: Hearing, tympanic membrane, external ear and ear canal normal. Tympanic membrane is not erythematous.  Left Ear: Hearing, external ear and ear canal normal. Tympanic membrane is not erythematous.  Eyes: Pupils are equal, round, and reactive to light.  Neck: Neck supple.  Cardiovascular: Normal rate.   Respiratory: Effort normal.  GI: Soft.  Musculoskeletal: Normal range of motion.  Neurological: She is alert and oriented to person, place, and time. She has normal strength and normal reflexes. GCS eye subscore is 4. GCS verbal subscore is 5. GCS motor subscore is 6.  Skin: Skin is warm. She is not diaphoretic.  Psychiatric: Her behavior is normal.    MAU Course  Procedures None  MDM Dilaudid 1 mg IV Head CT  Labetalol IV Phenergan Pepcid  Patient rates her pain 0/10 following pain medication  +fht Pt unable to take labetalol due to medicaid pending; pt unable to afford the cost.  Assessment and Plan   A:  1. Headache in pregnancy, first trimester   2. Nausea and vomiting during pregnancy   3. Preexisting hypertension complicating pregnancy, antepartum, first trimester    P:  Discharge home in stable condition RX: Increase methyldopa to 500 mg BID Phenergan  Fioricet  Return to MAU as needed, if symptoms worsen   Darrelyn Hillock Yakelin Grenier, NP  08/18/2014, 2:30 PM

## 2014-08-18 NOTE — MAU Note (Signed)
Pt C/O HA since yesterday, went away with tylenol.  Ha came back last night, felt very clammy, states she passed out.  Took more tylenol but didn't sleep.  HA started @ 1200 today, states BP was 168/131 at work.  C/O chronic epigastric pain, was on priloscec, MD changed her med to zantac & her epigastric pain is worse.  N&V since noon, denies visual changes.

## 2014-08-19 NOTE — MAU Provider Note (Signed)
Attestation of Attending Supervision of Advanced Practitioner (PA/CNM/NP): Evaluation and management procedures were performed by the Advanced Practitioner under my supervision and collaboration.  I have reviewed the Advanced Practitioner's note and chart, and I agree with the management and plan.  Negative head CT.  08/18/2014   CLINICAL DATA:  Severe headache.  Nausea and vomiting.  EXAM: CT HEAD WITHOUT CONTRAST  TECHNIQUE: Contiguous axial images were obtained from the base of the skull through the vertex without intravenous contrast.  COMPARISON:  None.  FINDINGS: No evidence of intracranial hemorrhage, brain edema, or other signs of acute infarction. No evidence of intracranial mass lesion or mass effect. No abnormal extraaxial fluid collections identified. Ventricles are normal in size. No skull abnormality identified. Partial opacification of right ethmoid sinuses is noted.  IMPRESSION: No evidence of intracranial abnormality.  Right ethmoid sinusitis incidentally noted.   Electronically Signed   By: Myles Rosenthal M.D.   On: 08/18/2014 15:52   Patient to follow up for routine prenatal care  Jaynie Collins, MD, FACOG Attending Obstetrician & Gynecologist Faculty Practice, [facility]

## 2014-08-20 ENCOUNTER — Inpatient Hospital Stay (HOSPITAL_COMMUNITY)
Admission: AD | Admit: 2014-08-20 | Discharge: 2014-08-20 | Disposition: A | Discharge status: Home / Self Care | Payer: Medicaid Other | Source: Ambulatory Visit | Attending: Obstetrics and Gynecology | Admitting: Obstetrics and Gynecology

## 2014-08-20 ENCOUNTER — Encounter (HOSPITAL_COMMUNITY): Payer: Self-pay | Admitting: *Deleted

## 2014-08-20 DIAGNOSIS — O219 Vomiting of pregnancy, unspecified: Secondary | ICD-10-CM

## 2014-08-20 DIAGNOSIS — R079 Chest pain, unspecified: Secondary | ICD-10-CM | POA: Diagnosis present

## 2014-08-20 DIAGNOSIS — O10911 Unspecified pre-existing hypertension complicating pregnancy, first trimester: Secondary | ICD-10-CM

## 2014-08-20 DIAGNOSIS — R945 Abnormal results of liver function studies: Secondary | ICD-10-CM

## 2014-08-20 DIAGNOSIS — R7989 Other specified abnormal findings of blood chemistry: Secondary | ICD-10-CM | POA: Insufficient documentation

## 2014-08-20 DIAGNOSIS — O10019 Pre-existing essential hypertension complicating pregnancy, unspecified trimester: Secondary | ICD-10-CM | POA: Insufficient documentation

## 2014-08-20 DIAGNOSIS — K219 Gastro-esophageal reflux disease without esophagitis: Secondary | ICD-10-CM | POA: Diagnosis not present

## 2014-08-20 DIAGNOSIS — O210 Mild hyperemesis gravidarum: Secondary | ICD-10-CM | POA: Insufficient documentation

## 2014-08-20 LAB — URINALYSIS, ROUTINE W REFLEX MICROSCOPIC
Bilirubin Urine: NEGATIVE
Glucose, UA: NEGATIVE mg/dL
Hgb urine dipstick: NEGATIVE
Ketones, ur: NEGATIVE mg/dL
Leukocytes, UA: NEGATIVE
Nitrite: NEGATIVE
Protein, ur: NEGATIVE mg/dL
Specific Gravity, Urine: 1.01 (ref 1.005–1.030)
Urobilinogen, UA: 0.2 mg/dL (ref 0.0–1.0)
pH: 7 (ref 5.0–8.0)

## 2014-08-20 MED ORDER — GI COCKTAIL ~~LOC~~
30.0000 mL | Freq: Once | ORAL | Status: AC
  Administered 2014-08-20: 30 mL via ORAL
  Filled 2014-08-20: qty 30

## 2014-08-20 MED ORDER — PANTOPRAZOLE SODIUM 40 MG PO TBEC: 40.0000 mg | DELAYED_RELEASE_TABLET | Freq: Every day | ORAL | Status: DC

## 2014-08-20 NOTE — MAU Note (Signed)
Pt sent to MAU from clinic, began having upper chest pain @ 1900 last night, feels like she can't take a deep breath.  Pain hurts from throat down into the middle of her chest.  Pain does not radiate.  Pt states she feels very cold in her hands & her head, then feels like she is going to pass out. Vomiting in triage.

## 2014-08-20 NOTE — MAU Provider Note (Signed)
Chief Complaint: No chief complaint on file.   First Provider Initiated Contact with Patient 08/20/14 1014     SUBJECTIVE HPI: Jennifer Holmes is a 28 y.o. G2P1001 at [redacted]w[redacted]d by LMP who presents with upper left and right chest pain since last night that is constant, dull and nonradiating. She describes it as SOB with inability to take a full breath. Vomited and felt faint after arrival to triage but denies nausea or orthostatic sx at present. Took Zantac this morning and has had issues with GERD and nausea/vomiting of pregnancy. Seen here 2 days ago for H/A and had normal head CT. CMP was significant were mildly elevated AST 45 ALT 49. CBC normal.  Past Medical History  Diagnosis Date  . Hypertension   . GERD (gastroesophageal reflux disease)    OB History  Gravida Para Term Preterm AB SAB TAB Ectopic Multiple Living  0 0 0 0 0 0 1    # Outcome Date GA Lbr Len/2nd Weight Sex Delivery Anes PTL Lv  2 CUR           1 TRM 03/01/08 [redacted]w[redacted]d  3.629 kg (8 lb) M CS Spinal  Y     Comments: c/s due to mother's heart problems; pp hemorrhage      Past Surgical History  Procedure Laterality Date  . Cesarean section     History   Social History  . Marital Status: Married    Spouse Name: N/A    Number of Children: N/A  . Years of Education: N/A   Occupational History  . Not on file.   Social History Main Topics  . Smoking status: Never Smoker   . Smokeless tobacco: Never Used  . Alcohol Use: No  . Drug Use: No  . Sexual Activity: Yes    Birth Control/ Protection: None   Other Topics Concern  . Not on file   Social History Narrative  . No narrative on file   No current facility-administered medications on file prior to encounter.   Current Outpatient Prescriptions on File Prior to Encounter  Medication Sig Dispense Refill  . aspirin EC 81 MG tablet Take 1 tablet (81 mg total) by mouth daily. For prevention of preeclampsia  200 tablet  0  . methyldopa (ALDOMET) 500 MG  tablet Take 1 tablet (500 mg total) by mouth 2 (two) times daily.  60 tablet  1  . Prenatal Multivit-Min-Fe-FA (PRENATAL VITAMINS PO) Take 1 tablet by mouth daily.       . promethazine (PHENERGAN) 25 MG tablet Take 1 tablet (25 mg total) by mouth every 6 (six) hours as needed for nausea or vomiting.  30 tablet  0  . ranitidine (ZANTAC) 150 MG tablet Take 150 mg by mouth 2 (two) times daily.      . butalbital-acetaminophen-caffeine (FIORICET) 50-325-40 MG per tablet Take 1-2 tablets by mouth every 6 (six) hours as needed for headache.  20 tablet  0  . omeprazole (PRILOSEC) 20 MG capsule Take 1 capsule (20 mg total) by mouth daily.  30 capsule  0   Allergies  Allergen Reactions  . Lactose Intolerance (Gi)     Gi upset    ROS: Pertinent items in HPI  OBJECTIVE Blood pressure 129/85, pulse 88, temperature 98 F (36.7 C), temperature source Oral, resp. rate 20, last menstrual period 05/14/2014, SpO2 100.00%. GENERAL: Mildly obese female in mild distress.  HEENT: Normocephalic HEART: RRR w/o m RESP: normal effort, CTA bilat. No chest wall  TTP elicited ABDOMEN: Soft, non-tender, DT 167 EXTREMITIES: Nontender, no edema NEURO: Alert and oriented  LAB RESULTS Results for orders placed during the hospital encounter of 08/20/14 (from the past 24 hour(s))  URINALYSIS, ROUTINE W REFLEX MICROSCOPIC     Status: Abnormal   Collection Time    08/20/14 10:35 AM      Result Value Ref Range   Color, Urine YELLOW  YELLOW   APPearance HAZY (*) CLEAR   Specific Gravity, Urine 1.010  1.005 - 1.030   pH 7.0  5.0 - 8.0   Glucose, UA NEGATIVE  NEGATIVE mg/dL   Hgb urine dipstick NEGATIVE  NEGATIVE   Bilirubin Urine NEGATIVE  NEGATIVE   Ketones, ur NEGATIVE  NEGATIVE mg/dL   Protein, ur NEGATIVE  NEGATIVE mg/dL   Urobilinogen, UA 0.2  0.0 - 1.0 mg/dL   Nitrite NEGATIVE  NEGATIVE   Leukocytes, UA NEGATIVE  NEGATIVE    IMAGING Ct Head Wo Contrast  08/18/2014   CLINICAL DATA:  Severe headache.  Nausea  and vomiting.  EXAM: CT HEAD WITHOUT CONTRAST  TECHNIQUE: Contiguous axial images were obtained from the base of the skull through the vertex without intravenous contrast.  COMPARISON:  None.  FINDINGS: No evidence of intracranial hemorrhage, brain edema, or other signs of acute infarction. No evidence of intracranial mass lesion or mass effect. No abnormal extraaxial fluid collections identified. Ventricles are normal in size. No skull abnormality identified. Partial opacification of right ethmoid sinuses is noted.  IMPRESSION: No evidence of intracranial abnormality.  Right ethmoid sinusitis incidentally noted.   Electronically Signed   By: Myles Rosenthal M.D.   On: 08/18/2014 15:52   [place] Mfm Fetal Nuchal Translucency  08/17/2014   OBSTETRICAL ULTRASOUND: This exam was performed within a [facility]. The OB US report was generated in the AS system, and faxed to the ordering physician.   This report is available in the [employer]. See the AS Obstetric US report via the Image Link.   MAU COURSE EKG normal, NSR 75bpm Vomited in triage, declined antiemetic initially GI cocktail given> little improvement, main sx after observation in MAU and trial GI cocktail is feeling weak and fatigued  ASSESSMENT 1. Preexisting hypertension complicating pregnancy, antepartum, first trimester   2. Gastroesophageal reflux disease, esophagitis presence not specified   3. Nausea/vomiting in pregnancy   4. Abnormal LFTs     PLAN Discharge home with work excuse through the weekend    Medication List    STOP taking these medications       omeprazole 20 MG capsule  Commonly known as:  PRILOSEC      TAKE these medications       aspirin EC 81 MG tablet  Take 1 tablet (81 mg total) by mouth daily. For prevention of preeclampsia     butalbital-acetaminophen-caffeine 50-325-40 MG per tablet  Commonly known as:  FIORICET  Take 1-2 tablets by mouth every 6 (six) hours as needed for headache.      methyldopa 500 MG tablet  Commonly known as:  ALDOMET  Take 1 tablet (500 mg total) by mouth 2 (two) times daily.     pantoprazole 40 MG tablet  Commonly known as:  PROTONIX  Take 1 tablet (40 mg total) by mouth daily.     PRENATAL VITAMINS PO  Take 1 tablet by mouth daily.     promethazine 25 MG tablet  Commonly known as:  PHENERGAN  Take 1 tablet (25 mg total) by mouth  every 6 (six) hours as needed for nausea or vomiting.     ranitidine 150 MG tablet  Commonly known as:  ZANTAC  Take 150 mg by mouth 2 (two) times daily.      F/U mildly elevated LFTs in office     Danae Orleans, CNM 08/20/2014  10:27 AM

## 2014-08-20 NOTE — MAU Note (Signed)
Pt became very pale & diaphoretic while vomiting in triage.

## 2014-08-20 NOTE — Discharge Instructions (Signed)
Food Choices for Gastroesophageal Reflux Disease  When you have gastroesophageal reflux disease (GERD), the foods you eat and your eating habits are very important. Choosing the right foods can help ease the discomfort of GERD.  WHAT GENERAL GUIDELINES DO I NEED TO FOLLOW?  · Choose fruits, vegetables, whole grains, low-fat dairy products, and low-fat meat, fish, and poultry.  · Limit fats such as oils, salad dressings, butter, nuts, and avocado.  · Keep a food diary to identify foods that cause symptoms.  · Avoid foods that cause reflux. These may be different for different people.  · Eat frequent small meals instead of three large meals each day.  · Eat your meals slowly, in a relaxed setting.  · Limit fried foods.  · Cook foods using methods other than frying.  · Avoid drinking alcohol.  · Avoid drinking large amounts of liquids with your meals.  · Avoid bending over or lying down until 2-3 hours after eating.  WHAT FOODS ARE NOT RECOMMENDED?  The following are some foods and drinks that may worsen your symptoms:  Vegetables  Tomatoes. Tomato juice. Tomato and spaghetti sauce. Chili peppers. Onion and garlic. Horseradish.  Fruits  Oranges, grapefruit, and lemon (fruit and juice).  Meats  High-fat meats, fish, and poultry. This includes hot dogs, ribs, ham, sausage, salami, and bacon.  Dairy  Whole milk and chocolate milk. Sour cream. Cream. Butter. Ice cream. Cream cheese.   Beverages  Coffee and tea, with or without caffeine. Carbonated beverages or energy drinks.  Condiments  Hot sauce. Barbecue sauce.   Sweets/Desserts  Chocolate and cocoa. Donuts. Peppermint and spearmint.  Fats and Oils  High-fat foods, including French fries and potato chips.  Other  Vinegar. Strong spices, such as black pepper, white pepper, red pepper, cayenne, curry powder, cloves, ginger, and chili powder.  The items listed above may not be a complete list of foods and beverages to avoid. Contact your dietitian for more  information.  Document Released: 11/26/2005 Document Revised: 12/01/2013 Document Reviewed: 09/30/2013  ExitCare® Patient Information ©2015 [employer]. This information is not intended to replace advice given to you by your health care provider. Make sure you discuss any questions you have with your health care provider.    Heartburn During Pregnancy   Heartburn is a burning sensation in the chest caused by stomach acid backing up into the esophagus. Heartburn is common in pregnancy because a certain hormone (progesterone) is released when a woman is pregnant. The progesterone hormone may relax the valve that separates the esophagus from the stomach. This allows acid to go up into the esophagus, causing heartburn. Heartburn may also happen in pregnancy because the enlarging uterus pushes up on the stomach, which pushes more acid into the esophagus. This is especially true in the later stages of pregnancy. Heartburn problems usually go away after giving birth.  CAUSES   Heartburn is caused by stomach acid backing up into the esophagus. During pregnancy, this may result from various things, including:   · The progesterone hormone.  · Changing hormone levels.  · The growing uterus pushing stomach acid upward.  · Large meals.  · Certain foods and drinks.  · Exercise.  · Increased acid production.  SIGNS AND SYMPTOMS   · Burning pain in the chest or lower throat.  · Bitter taste in the mouth.  · Coughing.  DIAGNOSIS   Your health care provider will typically diagnose heartburn by taking a careful history of your concern. Blood tests   may be done to check for a certain type of bacteria that is associated with heartburn. Sometimes, heartburn is diagnosed by prescribing a heartburn medicine to see if the symptoms improve. In some cases, a procedure called an endoscopy may be done. In this procedure, a tube with a light and a camera on the end (endoscope) is used to examine the esophagus and the stomach.  TREATMENT    Treatment will vary depending on the severity of your symptoms. Your health care provider may recommend:  · Over-the-counter medicines (antacids, acid reducers) for mild heartburn.  · Prescription medicines to decrease stomach acid or to protect your stomach lining.  · Certain changes in your diet.  · Elevating the head of your bed by putting blocks under the legs. This helps prevent stomach acid from backing up into the esophagus when you are lying down.  HOME CARE INSTRUCTIONS   · Only take over-the-counter or prescription medicines as directed by your health care provider.  · Raise the head of your bed by putting blocks under the legs if instructed to do so by your health care provider. Sleeping with more pillows is not effective because it only changes the position of your head.  · Do not exercise right after eating.  · Avoid eating 2-3 hours before bed. Do not lie down right after eating.  · Eat small meals throughout the day instead of three large meals.  · Identify foods and beverages that make your symptoms worse and avoid them. Foods you may want to avoid include:  ¨ Peppers.  ¨ Chocolate.  ¨ High-fat foods, including fried foods.  ¨ Spicy foods.  ¨ Garlic and onions.  ¨ Citrus fruits, including oranges, grapefruit, lemons, and limes.  ¨ Food containing tomatoes or tomato products.  ¨ Mint.  ¨ Carbonated and caffeinated drinks.  ¨ Vinegar.  SEEK MEDICAL CARE IF:  · You have abdominal pain of any kind.  · You feel burning in your upper abdomen or chest, especially after eating or lying down.  · You have nausea and vomiting.  · Your stomach feels upset after you eat.  SEEK IMMEDIATE MEDICAL CARE IF:   · You have severe chest pain that goes down your arm or into your jaw or neck.  · You feel sweaty, dizzy, or light-headed.  · You become short of breath.  · You vomit blood.  · You have difficulty or pain with swallowing.  · You have bloody or black, tarry stools.  · You have episodes of heartburn more than 3  times a week, for more than 2 weeks.  MAKE SURE YOU:  · Understand these instructions.  · Will watch your condition.  · Will get help right away if you are not doing well or get worse.  Document Released: 11/23/2000 Document Revised: 12/01/2013 Document Reviewed: 07/15/2013  ExitCare® Patient Information ©2015 [employer]. This information is not intended to replace advice given to you by your health care provider. Make sure you discuss any questions you have with your health care provider.

## 2014-08-24 NOTE — MAU Provider Note (Signed)
Attestation of Attending Supervision of Advanced Practitioner (CNM/NP): Evaluation and management procedures were performed by the Advanced Practitioner under my supervision and collaboration.  I have reviewed the Advanced Practitioner's note and chart, and I agree with the management and plan.  Maryse Brierley 08/24/2014 9:27 AM   

## 2014-08-26 ENCOUNTER — Other Ambulatory Visit: Payer: Self-pay

## 2014-08-26 LAB — SCREEN, FIRST TRIMESTER, SERUM: FIRST TRIMESTER SAMPLE: NEGATIVE

## 2014-09-02 ENCOUNTER — Ambulatory Visit (INDEPENDENT_AMBULATORY_CARE_PROVIDER_SITE_OTHER): Payer: Medicaid Other | Admitting: Obstetrics and Gynecology

## 2014-09-02 ENCOUNTER — Telehealth: Payer: Self-pay | Admitting: *Deleted

## 2014-09-02 ENCOUNTER — Encounter: Payer: Self-pay | Admitting: Obstetrics and Gynecology

## 2014-09-02 VITALS — BP 124/83 | HR 91 | Temp 98.4°F | Wt 172.8 lb

## 2014-09-02 DIAGNOSIS — O10019 Pre-existing essential hypertension complicating pregnancy, unspecified trimester: Secondary | ICD-10-CM

## 2014-09-02 DIAGNOSIS — O34219 Maternal care for unspecified type scar from previous cesarean delivery: Secondary | ICD-10-CM

## 2014-09-02 DIAGNOSIS — O10912 Unspecified pre-existing hypertension complicating pregnancy, second trimester: Secondary | ICD-10-CM

## 2014-09-02 DIAGNOSIS — O0990 Supervision of high risk pregnancy, unspecified, unspecified trimester: Secondary | ICD-10-CM

## 2014-09-02 DIAGNOSIS — O0992 Supervision of high risk pregnancy, unspecified, second trimester: Secondary | ICD-10-CM

## 2014-09-02 LAB — POCT URINALYSIS DIP (DEVICE)
Bilirubin Urine: NEGATIVE
Glucose, UA: NEGATIVE mg/dL
Hgb urine dipstick: NEGATIVE
Ketones, ur: NEGATIVE mg/dL
Leukocytes, UA: NEGATIVE
Nitrite: NEGATIVE
Protein, ur: NEGATIVE mg/dL
Specific Gravity, Urine: 1.015 (ref 1.005–1.030)
Urobilinogen, UA: 0.2 mg/dL (ref 0.0–1.0)
pH: 6.5 (ref 5.0–8.0)

## 2014-09-02 LAB — COMPREHENSIVE METABOLIC PANEL
ALT: 70 U/L — ABNORMAL HIGH (ref 0–35)
AST: 57 U/L — ABNORMAL HIGH (ref 0–37)
Albumin: 4 g/dL (ref 3.5–5.2)
Alkaline Phosphatase: 68 U/L (ref 39–117)
BUN: 5 mg/dL — ABNORMAL LOW (ref 6–23)
CO2: 22 mEq/L (ref 19–32)
Calcium: 9.1 mg/dL (ref 8.4–10.5)
Chloride: 104 mEq/L (ref 96–112)
Creat: 0.35 mg/dL — ABNORMAL LOW (ref 0.50–1.10)
Glucose, Bld: 81 mg/dL (ref 70–99)
Potassium: 3.9 mEq/L (ref 3.5–5.3)
Sodium: 136 mEq/L (ref 135–145)
Total Bilirubin: 0.4 mg/dL (ref 0.2–1.2)
Total Protein: 6.8 g/dL (ref 6.0–8.3)

## 2014-09-02 NOTE — Addendum Note (Signed)
Addended by: Jill Side on: 09/02/2014 11:34 AM   Modules accepted: Orders

## 2014-09-02 NOTE — Progress Notes (Signed)
Discussed flu vaccine-- information sheet given-- patient to decide at next visit.

## 2014-09-02 NOTE — Addendum Note (Signed)
Addended by: Jill Side on: 09/02/2014 11:45 AM   Modules accepted: Orders

## 2014-09-02 NOTE — Telephone Encounter (Signed)
Called pt with interpreter - Jennifer Holmes. She was informed that we need to draw blood on her today.  She agreed and will return to the clinic this morning.

## 2014-09-02 NOTE — Progress Notes (Signed)
Anatomy U/S with Radiology 09/27/14 @ 8a, spanish interpreter Alvi present.

## 2014-09-02 NOTE — Progress Notes (Signed)
Patient is doing well without complaints. Anatomy ultrasound ordered

## 2014-09-03 LAB — CREATININE CLEARANCE, URINE, 24 HOUR
Creatinine Clearance: 214 mL/min — ABNORMAL HIGH (ref 75–115)
Creatinine, 24H Ur: 1079 mg/d (ref 700–1800)
Creatinine, Urine: 63.5 mg/dL
Creatinine: 0.35 mg/dL — ABNORMAL LOW (ref 0.50–1.10)

## 2014-09-03 LAB — ALPHA FETOPROTEIN, MATERNAL
AFP: 21.5 ng/mL
Curr Gest Age: 17.1 wks.days
MoM for AFP: 0.65
Open Spina bifida: NEGATIVE
Osb Risk: <1:27300 {titer}

## 2014-09-03 LAB — PROTEIN, URINE, 24 HOUR
Protein, 24H Urine: 136 mg/d (ref <150)
Protein, Urine: 8 mg/dL (ref 5–24)

## 2014-09-12 ENCOUNTER — Encounter: Payer: Self-pay | Admitting: Obstetrics and Gynecology

## 2014-09-12 DIAGNOSIS — R945 Abnormal results of liver function studies: Secondary | ICD-10-CM

## 2014-09-12 DIAGNOSIS — R7989 Other specified abnormal findings of blood chemistry: Secondary | ICD-10-CM | POA: Insufficient documentation

## 2014-09-27 ENCOUNTER — Ambulatory Visit (HOSPITAL_COMMUNITY)
Admission: RE | Admit: 2014-09-27 | Discharge: 2014-09-27 | Disposition: A | Discharge status: Home / Self Care | Payer: Medicaid Other | Source: Ambulatory Visit | Attending: Obstetrics and Gynecology | Admitting: Obstetrics and Gynecology

## 2014-09-27 DIAGNOSIS — O3421 Maternal care for scar from previous cesarean delivery: Secondary | ICD-10-CM | POA: Insufficient documentation

## 2014-09-27 DIAGNOSIS — Z36 Encounter for antenatal screening of mother: Secondary | ICD-10-CM | POA: Insufficient documentation

## 2014-09-27 DIAGNOSIS — O10019 Pre-existing essential hypertension complicating pregnancy, unspecified trimester: Secondary | ICD-10-CM | POA: Insufficient documentation

## 2014-09-27 DIAGNOSIS — O0992 Supervision of high risk pregnancy, unspecified, second trimester: Secondary | ICD-10-CM

## 2014-09-27 DIAGNOSIS — O10912 Unspecified pre-existing hypertension complicating pregnancy, second trimester: Secondary | ICD-10-CM

## 2014-09-27 DIAGNOSIS — Z3689 Encounter for other specified antenatal screening: Secondary | ICD-10-CM

## 2014-09-27 DIAGNOSIS — Z3A00 Weeks of gestation of pregnancy not specified: Secondary | ICD-10-CM | POA: Insufficient documentation

## 2014-09-27 DIAGNOSIS — O34219 Maternal care for unspecified type scar from previous cesarean delivery: Secondary | ICD-10-CM

## 2014-09-29 ENCOUNTER — Encounter: Payer: Self-pay | Admitting: Obstetrics and Gynecology

## 2014-09-30 ENCOUNTER — Ambulatory Visit (INDEPENDENT_AMBULATORY_CARE_PROVIDER_SITE_OTHER): Payer: Medicaid Other | Admitting: Family Medicine

## 2014-09-30 VITALS — BP 121/72 | HR 86 | Temp 98.4°F | Wt 174.3 lb

## 2014-09-30 DIAGNOSIS — O26892 Other specified pregnancy related conditions, second trimester: Secondary | ICD-10-CM

## 2014-09-30 DIAGNOSIS — O0992 Supervision of high risk pregnancy, unspecified, second trimester: Secondary | ICD-10-CM

## 2014-09-30 DIAGNOSIS — N898 Other specified noninflammatory disorders of vagina: Secondary | ICD-10-CM

## 2014-09-30 LAB — POCT URINALYSIS DIP (DEVICE)
Bilirubin Urine: NEGATIVE
Glucose, UA: NEGATIVE mg/dL
Hgb urine dipstick: NEGATIVE
Ketones, ur: NEGATIVE mg/dL
Leukocytes, UA: NEGATIVE
Nitrite: NEGATIVE
Protein, ur: NEGATIVE mg/dL
Specific Gravity, Urine: 1.015 (ref 1.005–1.030)
Urobilinogen, UA: 0.2 mg/dL (ref 0.0–1.0)
pH: 7 (ref 5.0–8.0)

## 2014-09-30 MED ORDER — PROMETHAZINE HCL 25 MG PO TABS: 25.0000 mg | ORAL_TABLET | Freq: Four times a day (QID) | ORAL | Status: DC | PRN

## 2014-09-30 NOTE — Patient Instructions (Signed)
Second Trimester of Pregnancy The second trimester is from week 13 through week 28, months 4 through 6. The second trimester is often a time when you feel your best. Your body has also adjusted to being pregnant, and you begin to feel better physically. Usually, morning sickness has lessened or quit completely, you may have more energy, and you may have an increase in appetite. The second trimester is also a time when the fetus is growing rapidly. At the end of the sixth month, the fetus is about 9 inches long and weighs about 1 pounds. You will likely begin to feel the baby move (quickening) between 18 and 20 weeks of the pregnancy. BODY CHANGES Your body goes through many changes during pregnancy. The changes vary from woman to woman.   Your weight will continue to increase. You will notice your lower abdomen bulging out.  You may begin to get stretch marks on your hips, abdomen, and breasts.  You may develop headaches that can be relieved by medicines approved by your health care provider.  You may urinate more often because the fetus is pressing on your bladder.  You may develop or continue to have heartburn as a result of your pregnancy.  You may develop constipation because certain hormones are causing the muscles that push waste through your intestines to slow down.  You may develop hemorrhoids or swollen, bulging veins (varicose veins).  You may have back pain because of the weight gain and pregnancy hormones relaxing your joints between the bones in your pelvis and as a result of a shift in weight and the muscles that support your balance.  Your breasts will continue to grow and be tender.  Your gums may bleed and may be sensitive to brushing and flossing.  Dark spots or blotches (chloasma, mask of pregnancy) may develop on your face. This will likely fade after the baby is born.  A dark line from your belly button to the pubic area (linea nigra) may appear. This will likely fade  after the baby is born.  You may have changes in your hair. These can include thickening of your hair, rapid growth, and changes in texture. Some women also have hair loss during or after pregnancy, or hair that feels dry or thin. Your hair will most likely return to normal after your baby is born. WHAT TO EXPECT AT YOUR PRENATAL VISITS During a routine prenatal visit:  You will be weighed to make sure you and the fetus are growing normally.  Your blood pressure will be taken.  Your abdomen will be measured to track your baby's growth.  The fetal heartbeat will be listened to.  Any test results from the previous visit will be discussed. Your health care provider may ask you:  How you are feeling.  If you are feeling the baby move.  If you have had any abnormal symptoms, such as leaking fluid, bleeding, severe headaches, or abdominal cramping.  If you have any questions. Other tests that may be performed during your second trimester include:  Blood tests that check for:  Low iron levels (anemia).  Gestational diabetes (between 24 and 28 weeks).  Rh antibodies.  Urine tests to check for infections, diabetes, or protein in the urine.  An ultrasound to confirm the proper growth and development of the baby.  An amniocentesis to check for possible genetic problems.  Fetal screens for spina bifida and Down syndrome. HOME CARE INSTRUCTIONS   Avoid all smoking, herbs, alcohol, and unprescribed   drugs. These chemicals affect the formation and growth of the baby.  Follow your health care provider's instructions regarding medicine use. There are medicines that are either safe or unsafe to take during pregnancy.  Exercise only as directed by your health care provider. Experiencing uterine cramps is a good sign to stop exercising.  Continue to eat regular, healthy meals.  Wear a good support bra for breast tenderness.  Do not use hot tubs, steam rooms, or saunas.  Wear your  seat belt at all times when driving.  Avoid raw meat, uncooked cheese, cat litter boxes, and soil used by cats. These carry germs that can cause birth defects in the baby.  Take your prenatal vitamins.  Try taking a stool softener (if your health care provider approves) if you develop constipation. Eat more high-fiber foods, such as fresh vegetables or fruit and whole grains. Drink plenty of fluids to keep your urine clear or pale yellow.  Take warm sitz baths to soothe any pain or discomfort caused by hemorrhoids. Use hemorrhoid cream if your health care provider approves.  If you develop varicose veins, wear support hose. Elevate your feet for 15 minutes, 3-4 times a day. Limit salt in your diet.  Avoid heavy lifting, wear low heel shoes, and practice good posture.  Rest with your legs elevated if you have leg cramps or low back pain.  Visit your dentist if you have not gone yet during your pregnancy. Use a soft toothbrush to brush your teeth and be gentle when you floss.  A sexual relationship may be continued unless your health care provider directs you otherwise.  Continue to go to all your prenatal visits as directed by your health care provider. SEEK MEDICAL CARE IF:   You have dizziness.  You have mild pelvic cramps, pelvic pressure, or nagging pain in the abdominal area.  You have persistent nausea, vomiting, or diarrhea.  You have a bad smelling vaginal discharge.  You have pain with urination. SEEK IMMEDIATE MEDICAL CARE IF:   You have a fever.  You are leaking fluid from your vagina.  You have spotting or bleeding from your vagina.  You have severe abdominal cramping or pain.  You have rapid weight gain or loss.  You have shortness of breath with chest pain.  You notice sudden or extreme swelling of your face, hands, ankles, feet, or legs.  You have not felt your baby move in over an hour.  You have severe headaches that do not go away with  medicine.  You have vision changes. Document Released: 11/20/2001 Document Revised: 12/01/2013 Document Reviewed: 01/27/2013 ExitCare Patient Information 2015 [employer]. This information is not intended to replace advice given to you by your health care provider. Make sure you discuss any questions you have with your health care provider.  

## 2014-09-30 NOTE — Progress Notes (Signed)
[name] used as interpreter for this encounter.  Requests refill of phenergan.  Reports white vaginal discharge with itching-- wet prep today.

## 2014-09-30 NOTE — Progress Notes (Signed)
Follow-up U/S 10/25/14 @ 815a.  Spanish interpreter Albertina SenegalMarly Adams present.

## 2014-09-30 NOTE — Addendum Note (Signed)
Addended by: Levie HeritageSTINSON, Chanele Douglas J on: 09/30/2014 08:47 AM   Modules accepted: Orders

## 2014-09-30 NOTE — Progress Notes (Signed)
4 days of white vaginal discharge with itching.  Wet prep obtained.  Incomplete fetal survey, f/u in 4 weeks.  No other concern.

## 2014-10-01 LAB — WET PREP, GENITAL
Clue Cells Wet Prep HPF POC: NONE SEEN
Trich, Wet Prep: NONE SEEN
WBC, Wet Prep HPF POC: NONE SEEN
Yeast Wet Prep HPF POC: NONE SEEN

## 2014-10-08 ENCOUNTER — Telehealth: Payer: Self-pay | Admitting: *Deleted

## 2014-10-08 DIAGNOSIS — N898 Other specified noninflammatory disorders of vagina: Secondary | ICD-10-CM

## 2014-10-08 MED ORDER — FLUCONAZOLE 150 MG PO TABS: 150.0000 mg | ORAL_TABLET | Freq: Once | ORAL | Status: DC

## 2014-10-08 NOTE — Telephone Encounter (Signed)
Pt called requesting results from wet prep.  Pt describes white, itchy discharge.  Wet prep negative. Consulted with Raynelle FanningJulie NP and she gave order for Diflucan.  Pharmacy verified and ordered. Pt verbalizes understanding. Interpreter Alviris Almonte Present for phone encounter.

## 2014-10-11 ENCOUNTER — Encounter: Payer: Self-pay | Admitting: Obstetrics and Gynecology

## 2014-10-25 ENCOUNTER — Ambulatory Visit (HOSPITAL_COMMUNITY)
Admission: RE | Admit: 2014-10-25 | Discharge: 2014-10-25 | Disposition: A | Discharge status: Home / Self Care | Payer: Self-pay | Source: Ambulatory Visit | Attending: Family Medicine | Admitting: Family Medicine

## 2014-10-25 DIAGNOSIS — O0992 Supervision of high risk pregnancy, unspecified, second trimester: Secondary | ICD-10-CM

## 2014-10-25 DIAGNOSIS — Z3A22 22 weeks gestation of pregnancy: Secondary | ICD-10-CM | POA: Insufficient documentation

## 2014-10-25 DIAGNOSIS — IMO0002 Reserved for concepts with insufficient information to code with codable children: Secondary | ICD-10-CM | POA: Insufficient documentation

## 2014-10-25 DIAGNOSIS — Z0489 Encounter for examination and observation for other specified reasons: Secondary | ICD-10-CM | POA: Insufficient documentation

## 2014-10-25 DIAGNOSIS — O10919 Unspecified pre-existing hypertension complicating pregnancy, unspecified trimester: Secondary | ICD-10-CM | POA: Insufficient documentation

## 2014-10-25 DIAGNOSIS — O10012 Pre-existing essential hypertension complicating pregnancy, second trimester: Secondary | ICD-10-CM | POA: Insufficient documentation

## 2014-10-25 DIAGNOSIS — Z36 Encounter for antenatal screening of mother: Secondary | ICD-10-CM | POA: Insufficient documentation

## 2014-10-28 ENCOUNTER — Ambulatory Visit (INDEPENDENT_AMBULATORY_CARE_PROVIDER_SITE_OTHER): Payer: Medicaid Other | Admitting: Family

## 2014-10-28 ENCOUNTER — Other Ambulatory Visit: Payer: Self-pay | Admitting: Family

## 2014-10-28 VITALS — BP 120/85 | HR 101 | Temp 97.5°F | Wt 180.7 lb

## 2014-10-28 DIAGNOSIS — R7989 Other specified abnormal findings of blood chemistry: Secondary | ICD-10-CM

## 2014-10-28 DIAGNOSIS — R945 Abnormal results of liver function studies: Secondary | ICD-10-CM

## 2014-10-28 DIAGNOSIS — O10912 Unspecified pre-existing hypertension complicating pregnancy, second trimester: Secondary | ICD-10-CM

## 2014-10-28 DIAGNOSIS — O0992 Supervision of high risk pregnancy, unspecified, second trimester: Secondary | ICD-10-CM

## 2014-10-28 NOTE — Progress Notes (Signed)
Pt reports coughing for past two weeks, denies fever or chills.  Exam - lungs clear.  Advised taking OTC cough medicine.  Reviewed elevated LFTs, hx of fatty liver.  Obtained hepatis panel and patient will schedule appointment with GI doctor she has seen in past (per recommendation in problem list).  Urine results not available at discharge.

## 2014-10-28 NOTE — Progress Notes (Signed)
Patient reports being sick for the past 2 weeks; Jennifer Holmes present for interpreter

## 2014-10-29 LAB — HEPATITIS PANEL, ACUTE
HCV Ab: NEGATIVE
Hep A IgM: NONREACTIVE
Hep B C IgM: NONREACTIVE
Hepatitis B Surface Ag: NEGATIVE

## 2014-11-03 ENCOUNTER — Encounter: Payer: Self-pay | Admitting: Obstetrics & Gynecology

## 2014-11-12 ENCOUNTER — Ambulatory Visit: Payer: Self-pay | Attending: Family Medicine

## 2014-11-18 ENCOUNTER — Ambulatory Visit (INDEPENDENT_AMBULATORY_CARE_PROVIDER_SITE_OTHER): Payer: Self-pay | Admitting: Family

## 2014-11-18 VITALS — BP 120/68 | HR 79 | Temp 98.0°F | Wt 182.1 lb

## 2014-11-18 DIAGNOSIS — O0992 Supervision of high risk pregnancy, unspecified, second trimester: Secondary | ICD-10-CM

## 2014-11-18 LAB — POCT URINALYSIS DIP (DEVICE)
Bilirubin Urine: NEGATIVE
Glucose, UA: NEGATIVE mg/dL
Hgb urine dipstick: NEGATIVE
Ketones, ur: NEGATIVE mg/dL
Nitrite: NEGATIVE
Protein, ur: NEGATIVE mg/dL
Specific Gravity, Urine: 1.02 (ref 1.005–1.030)
Urobilinogen, UA: 1 mg/dL (ref 0.0–1.0)
pH: 7 (ref 5.0–8.0)

## 2014-11-18 NOTE — Progress Notes (Signed)
Doing well, no complaints offered.  Reports that her acid reflux has improved since starting Zantac.  States she has not seen the GI doctor because it is too expensive for her.  Denies vaginal bleeding, cramping or LOF.  Reports fetal movement, but states this baby does not move as much as her last baby.  Hepatitis results give to pat. F/U ultrasound ordered for today.  Pt reports she does want to attemp T[facility].

## 2014-11-18 NOTE — Progress Notes (Signed)
Dorita Arias used as interpreter for this encounter.  

## 2014-11-18 NOTE — Patient Instructions (Signed)
Hipertensin durante el embarazo (Hypertension During Pregnancy) Cuando se sufre hipertensin arterial o presin arterial alta, existe una presin extra en el interior de los vasos sanguneos que llevan la sangre desde el corazn al resto del cuerpo (arterias). Esto puede suceder en cualquier etapa de la vida, incluido el embarazo. La hipertensin durante el embarazo puede causar problemas para usted y el beb. Es posible que el beb no tenga el peso adecuado al nacer o puede que nazca antes de tiempo (prematuro). En los casos muy graves de hipertensin durante el embarazo puede estar en peligro la vida.  Durante el embarazo se pueden presentar diferentes tipos de hipertensin arterial. Estos incluyen:  Hipertensin crnica. Esto sucede cuando una mujer sufre de hipertensin arterial antes del embarazo y contina durante el este.  Hipertensin gestacional. Es cuando se desarrolla la hipertensin durante el embarazo.  Preeclampsia o toxemia del embarazo. Es un tipo muy grave de hipertensin que se desarrolla solo durante el embarazo. Afecta a todo el cuerpo y puede ser muy peligrosa para la madre y el beb. La hipertensin gestacional y preeclampsia por lo general, desaparecen despus del nacimiento del beb. La presin arterial probablemente se estabilizar en un perodo de 6 semanas. Las mujeres que sufren de hipertensin durante el embarazo tienen una mayor probabilidad de desarrollar hipertensin en etapas posteriores de la vida o en embarazos futuros. FACTORES DE RIESGO Existen ciertos factores que aumentan las probabilidades de que desarrolle hipertensin durante el embarazo. Estos incluyen:  Tener hipertensin arterial antes del embarazo.  Haber sufrido hipertensin arterial durante un embarazo anterior.  Tener sobrepeso.  Ser mayor de 40 aos.  Estar embarazada de ms de un beb.  Tener diabetes o problemas renales. SIGNOS Y SNTOMAS La hipertensin arterial gestacional y crnica en  raras ocasiones provoca sntomas. La preeclampsia causa sntomas, que pueden ser:  Aumento de las protenas en la orina. El mdico la controlar en cada visita prenatal.  Hinchazn de las manos y la cara.  Aumento rpido de peso.  Dolores de cabeza.  Cambios en la visin.  Molestias al ver la luz.  Dolor abdominal, especialmente en el rea superior derecha.  Dolor en el pecho.  Falta de aire.  Aumento de los reflejos.  Convulsiones. Las convulsiones ocurren en una forma ms grave de preeclampsia, llamada eclampsia. DIAGNSTICO  Es posible que se le diagnostique hipertensin arterial durante un control prenatal regular. En cada visita prenatal, es posible que le realicen los siguientes exmenes:  Control de la presin arterial.  Anlisis de orina para detectar protenas en la orina. El tipo de hipertensin que se diagnostica depende del momento en que se desarroll. Tambin depende de la lectura de su presin arterial especfica.  El desarrollo de hipertensin arterial antes de lasemana 20 de embarazo es consecuente con la hipertensin arterial crnica.  El desarrollo de hipertensin arterial despus de la semana 20 de embarazo es consecuente con la hipertensin gestacional.  Hipertensin con aumento de la protena urinaria se diagnostica como preeclampsia.  Las mediciones de la presin arterial de ms de 160 sistlica o 110 diastlica son un signo de preeclampsia grave. TRATAMIENTO El tratamiento para la hipertensin durante el embarazo vara. Depende del tipo de hipertensin y de su gravedad.  Si toma medicamentos para la hipertensin crnica, puede que tenga que cambiarlos.  Los medicamentos llamados inhibidores de la ECA no deben tomarse durante el embarazo.  Para las mujeres que tienen factores de riesgo de preeclampsia pueden recomendarse bajas dosis de aspirina.  Si usted tiene   hipertensin gestacional, tendr que tomar un medicamento para la presin arterial que  sea seguro durante el embarazo. El mdico le recomendar el medicamento apropiado.  Si tiene preeclampsia grave, es posible que tenga que permanecer en el hospital. Los mdicos la controlarn a usted y al beb muy de cerca. Probablemente deba tomar un medicamento denominado sulfato de magnesio para prevenir las convulsiones y reducir la presin arterial.  A veces es necesario inducir un parto prematuro. Por ejemplo, si la afeccin empeora. Se hace para protegerlos a usted y a su beb. La nica cura para la preeclampsia es el parto.  Su mdico puede recomendarle que tome una aspirina de dosis baja (81mg) cada da, a fin de ayudar a prevenir la hipertensin durante el embarazo, si est en riesgo de padecer preeclampsia. Puede estar en riesgo de padecer preeclampsia si:  Padeci preeclampsia o eclampsia durante un embarazo anterior.  Su beb no creci segn lo previsto durante un embarazo anterior.  Tuvo un parto prematuro en un embarazo anterior.  Experiment una separacin de la placenta desde el tero (desprendimiento abrupto de la placenta) durante un embarazo anterior.  Perdi un beb en un embarazo anterior.  Est embarazada de ms de un beb.  Padece otras afecciones mdicas, como diabetes o una enfermedad autoinmunitaria. INSTRUCCIONES PARA EL CUIDADO EN EL HOGAR  Programe y concurra a todas las citas de control prenatal regulares. Esto es importante.  Tome los medicamentos solamente como se lo haya indicado el mdico. Dgale a su mdico todos los medicamentos que toma.  Consuma la menor cantidad posible de sal.  Realice actividad fsica con regularidad.  No beba alcohol.  No fume ni use productos que contengan tabaco.  No beba productos con cafena.  Acustese sobre el lado izquierdo cuando haga reposo. SOLICITE ATENCIN MDICA DE INMEDIATO SI:  Siente un dolor abdominal intenso.  Presenta hinchazn repentina en las manos, los tobillos o el rostro.  Aumenta ms de 4  libras (1,8 kg) en una semana.  Vomita repetidas veces.  Tiene una hemorragia vaginal abundante.  No siente que el beb se mueva mucho.  Tiene dolores de cabeza.  Tiene visin doble o borrosa.  Tiene calambres o espasmos musculares.  Le falta el aire.  Tiene los labios y las uas de los dedos de las manos de color azul.  Observa sangre en la orina. ASEGRESE DE QUE:  Comprende estas instrucciones.  Controlar su afeccin.  Recibir ayuda de inmediato si no mejora o si empeora. Document Released: 11/15/2011 Document Revised: 04/12/2014 ExitCare Patient Information 2015 [employer]. This information is not intended to replace advice given to you by your health care provider. Make sure you discuss any questions you have with your health care provider.  

## 2014-11-18 NOTE — Progress Notes (Signed)
I examined pt and agree with documentation above and nurse midwife student plan of care.  Pt given copy of [facility] consent form.

## 2014-11-18 NOTE — Progress Notes (Signed)
Follow-up U/S with Radiology 11/23/14 @ 730a.

## 2014-11-23 ENCOUNTER — Ambulatory Visit (HOSPITAL_COMMUNITY)
Admission: RE | Admit: 2014-11-23 | Discharge: 2014-11-23 | Disposition: A | Discharge status: Home / Self Care | Payer: Self-pay | Source: Ambulatory Visit | Attending: Family | Admitting: Family

## 2014-11-23 DIAGNOSIS — Z3A26 26 weeks gestation of pregnancy: Secondary | ICD-10-CM | POA: Insufficient documentation

## 2014-11-23 DIAGNOSIS — O3421 Maternal care for scar from previous cesarean delivery: Secondary | ICD-10-CM | POA: Insufficient documentation

## 2014-11-23 DIAGNOSIS — O0992 Supervision of high risk pregnancy, unspecified, second trimester: Secondary | ICD-10-CM

## 2014-11-23 DIAGNOSIS — O10012 Pre-existing essential hypertension complicating pregnancy, second trimester: Secondary | ICD-10-CM | POA: Insufficient documentation

## 2014-12-06 ENCOUNTER — Ambulatory Visit (INDEPENDENT_AMBULATORY_CARE_PROVIDER_SITE_OTHER): Payer: Self-pay | Admitting: Obstetrics & Gynecology

## 2014-12-06 VITALS — BP 122/70 | HR 83 | Temp 98.1°F | Wt 184.2 lb

## 2014-12-06 DIAGNOSIS — Z3492 Encounter for supervision of normal pregnancy, unspecified, second trimester: Secondary | ICD-10-CM

## 2014-12-06 DIAGNOSIS — O0990 Supervision of high risk pregnancy, unspecified, unspecified trimester: Secondary | ICD-10-CM

## 2014-12-06 DIAGNOSIS — Z23 Encounter for immunization: Secondary | ICD-10-CM

## 2014-12-06 LAB — POCT URINALYSIS DIP (DEVICE)
Bilirubin Urine: NEGATIVE
Glucose, UA: NEGATIVE mg/dL
Hgb urine dipstick: NEGATIVE
Ketones, ur: NEGATIVE mg/dL
Nitrite: NEGATIVE
Protein, ur: NEGATIVE mg/dL
Specific Gravity, Urine: 1.01 (ref 1.005–1.030)
Urobilinogen, UA: 0.2 mg/dL (ref 0.0–1.0)
pH: 7.5 (ref 5.0–8.0)

## 2014-12-06 MED ORDER — TETANUS-DIPHTH-ACELL PERTUSSIS 5-2.5-18.5 LF-MCG/0.5 IM SUSP
0.5000 mL | Freq: Once | INTRAMUSCULAR | Status: AC
  Administered 2014-12-06: 0.5 mL via INTRAMUSCULAR

## 2014-12-06 NOTE — Patient Instructions (Signed)
Tercer trimestre de embarazo (Third Trimester of Pregnancy) El tercer trimestre va desde la semana29 hasta la 42, desde el sptimo hasta el noveno mes, y es la poca en la que el feto crece ms rpidamente. Hacia el final del noveno mes, el feto mide alrededor de 20pulgadas (45cm) de largo y pesa entre 6 y 10 libras (2,700 y 4,500kg).  CAMBIOS EN EL ORGANISMO Su organismo atraviesa por muchos cambios durante el embarazo, y estos varan de una mujer a otra.   Seguir aumentando de peso. Es de esperar que aumente entre 25 y 35libras (11 y 16kg) hacia el final del embarazo.  Podrn aparecer las primeras estras en las caderas, el abdomen y las mamas.  Puede tener necesidad de orinar con ms frecuencia porque el feto baja hacia la pelvis y ejerce presin sobre la vejiga.  Debido al embarazo podr sentir acidez estomacal con frecuencia.  Puede estar estreida, ya que ciertas hormonas enlentecen los movimientos de los msculos que empujan los desechos a travs de los intestinos.  Pueden aparecer hemorroides o abultarse e hincharse las venas (venas varicosas).  Puede sentir dolor plvico debido al aumento de peso y a que las hormonas del embarazo relajan las articulaciones entre los huesos de la pelvis. El dolor de espalda puede ser consecuencia de la sobrecarga de los msculos que soportan la postura.  Tal vez haya cambios en el cabello que pueden incluir su engrosamiento, crecimiento rpido y cambios en la textura. Adems, a algunas mujeres se les cae el cabello durante o despus del embarazo, o tienen el cabello seco o fino. Lo ms probable es que el cabello se le normalice despus del nacimiento del beb.  Las mamas seguirn creciendo y le dolern. A veces, puede haber una secrecin amarilla de las mamas llamada calostro.  El ombligo puede salir hacia afuera.  Puede sentir que le falta el aire debido a que se expande el tero.  Puede notar que el feto "baja" o lo siente ms bajo, en el  abdomen.  Puede tener una prdida de secrecin mucosa con sangre. Esto suele ocurrir en el trmino de unos pocos das a una semana antes de que comience el trabajo de parto.  El cuello del tero se vuelve delgado y blando (se borra) cerca de la fecha de parto. QU DEBE ESPERAR EN LOS EXMENES PRENATALES  Le harn exmenes prenatales cada 2semanas hasta la semana36. A partir de ese momento le harn exmenes semanales. Durante una visita prenatal de rutina:  La pesarn para asegurarse de que usted y el feto estn creciendo normalmente.  Le tomarn la presin arterial.  Le medirn el abdomen para controlar el desarrollo del beb.  Se escucharn los latidos cardacos fetales.  Se evaluarn los resultados de los estudios solicitados en visitas anteriores.  Le revisarn el cuello del tero cuando est prxima la fecha de parto para controlar si este se ha borrado. Alrededor de la semana36, el mdico le revisar el cuello del tero. Al mismo tiempo, realizar un anlisis de las secreciones del tejido vaginal. Este examen es para determinar si hay un tipo de bacteria, estreptococo Grupo B. El mdico le explicar esto con ms detalle. El mdico puede preguntarle lo siguiente:  Cmo le gustara que fuera el parto.  Cmo se siente.  Si siente los movimientos del beb.  Si ha tenido sntomas anormales, como prdida de lquido, sangrado, dolores de cabeza intensos o clicos abdominales.  Si tiene alguna pregunta. Otros exmenes o estudios de deteccin que pueden realizarse   durante el tercer trimestre incluyen lo siguiente:  Anlisis de sangre para controlar las concentraciones de hierro (anemia).  Controles fetales para determinar su salud, nivel de actividad y crecimiento. Si tiene alguna enfermedad o hay problemas durante el embarazo, le harn estudios. FALSO TRABAJO DE PARTO Es posible que sienta contracciones leves e irregulares que finalmente desaparecen. Se llaman contracciones de  Braxton Hicks o falso trabajo de parto. Las contracciones pueden durar horas, das o incluso semanas, antes de que el verdadero trabajo de parto se inicie. Si las contracciones ocurren a intervalos regulares, se intensifican o se hacen dolorosas, lo mejor es que la revise el mdico.  SIGNOS DE TRABAJO DE PARTO   Clicos de tipo menstrual.  Contracciones cada 5minutos o menos.  Contracciones que comienzan en la parte superior del tero y se extienden hacia abajo, a la zona inferior del abdomen y la espalda.  Sensacin de mayor presin en la pelvis o dolor de espalda.  Una secrecin de mucosidad acuosa o con sangre que sale de la vagina. Si tiene alguno de estos signos antes de la semana37 del embarazo, llame a su mdico de inmediato. Debe concurrir al hospital para que la controlen inmediatamente. INSTRUCCIONES PARA EL CUIDADO EN EL HOGAR   Evite fumar, consumir hierbas, beber alcohol y tomar frmacos que no le hayan recetado. Estas sustancias qumicas afectan la formacin y el desarrollo del beb.  Siga las indicaciones del mdico en relacin con el uso de medicamentos. Durante el embarazo, hay medicamentos que son seguros de tomar y otros que no.  Haga actividad fsica solo en la forma indicada por el mdico. Sentir clicos uterinos es un buen signo para detener la actividad fsica.  Contine comiendo alimentos que sanos con regularidad.  Use un sostn que le brinde buen soporte si le duelen las mamas.  No se d baos de inmersin en agua caliente, baos turcos ni saunas.  Colquese el cinturn de seguridad cuando conduzca.  No coma carne cruda ni queso sin cocinar; evite el contacto con las bandejas sanitarias de los gatos y la tierra que estos animales usan. Estos elementos contienen grmenes que pueden causar defectos congnitos en el beb.  Tome las vitaminas prenatales.  Si est estreida, pruebe un laxante suave (si el mdico lo autoriza). Consuma ms alimentos ricos en  fibra, como vegetales y frutas frescos y cereales integrales. Beba gran cantidad de lquido para mantener la orina de tono claro o color amarillo plido.  Dese baos de asiento con agua tibia para aliviar el dolor o las molestias causadas por las hemorroides. Use una crema para las hemorroides si el mdico la autoriza.  Si tiene venas varicosas, use medias de descanso. Eleve los pies durante 15minutos, 3 o 4veces por da. Limite la cantidad de sal en su dieta.  Evite levantar objetos pesados, use zapatos de tacones bajos y mantenga una buena postura.  Descanse con las piernas elevadas si tiene calambres o dolor de cintura.  Visite a su dentista si no lo ha hecho durante el embarazo. Use un cepillo de dientes blando para higienizarse los dientes y psese el hilo dental con suavidad.  Puede seguir manteniendo relaciones sexuales, a menos que el mdico le indique lo contrario.  No haga viajes largos excepto que sea absolutamente necesario y solo con la autorizacin del mdico.  Tome clases prenatales para entender, practicar y hacer preguntas sobre el trabajo de parto y el parto.  Haga un ensayo de la partida al hospital.  Prepare el bolso que   llevar al hospital.  Prepare la habitacin del beb.  Concurra a todas las visitas prenatales segn las indicaciones de su mdico. SOLICITE ATENCIN MDICA SI:  No est segura de que est en trabajo de parto o de que ha roto la bolsa de las aguas.  Tiene mareos.  Siente clicos leves, presin en la pelvis o dolor persistente en el abdomen.  Tiene nuseas, vmitos o diarrea persistentes.  Tiene secrecin vaginal con mal olor.  Siente dolor al orinar. SOLICITE ATENCIN MDICA DE INMEDIATO SI:   Tiene fiebre.  Tiene una prdida de lquido por la vagina.  Tiene sangrado o pequeas prdidas vaginales.  Siente dolor intenso o clicos en el abdomen.  Sube o baja de peso rpidamente.  Tiene dificultad para respirar y siente dolor de  pecho.  Sbitamente se le hinchan mucho el rostro, las manos, los tobillos, los pies o las piernas.  No ha sentido los movimientos del beb durante una hora.  Siente un dolor de cabeza intenso que no se alivia con medicamentos.  Hay cambios en la visin. Document Released: 09/05/2005 Document Revised: 12/01/2013 Exit[employer] Patient Information 2015 [employer]. This information is not intended to replace advice given to you by your health care provider. Make sure you discuss any questions you have with your health care provider.  

## 2014-12-06 NOTE — Progress Notes (Signed)
Recently used monistat for yeast and sx resolved. Repeat CMP today, 1 hr and RPR Tdap

## 2014-12-06 NOTE — Progress Notes (Signed)
Pt has been having itchy discharge and has been using Monistat.  28 wk labs with Tdap  A. Wayne SeverHerrera Research officer, trade unionpanish Interpreter for encounter.

## 2014-12-07 LAB — COMPREHENSIVE METABOLIC PANEL
ALT: 40 U/L — ABNORMAL HIGH (ref 0–35)
AST: 54 U/L — ABNORMAL HIGH (ref 0–37)
Albumin: 3.5 g/dL (ref 3.5–5.2)
Alkaline Phosphatase: 103 U/L (ref 39–117)
BUN: 5 mg/dL — ABNORMAL LOW (ref 6–23)
CO2: 21 mEq/L (ref 19–32)
Calcium: 8.7 mg/dL (ref 8.4–10.5)
Chloride: 105 mEq/L (ref 96–112)
Creat: 0.32 mg/dL — ABNORMAL LOW (ref 0.50–1.10)
Glucose, Bld: 147 mg/dL — ABNORMAL HIGH (ref 70–99)
Potassium: 3.5 mEq/L (ref 3.5–5.3)
Sodium: 137 mEq/L (ref 135–145)
Total Bilirubin: 0.4 mg/dL (ref 0.2–1.2)
Total Protein: 6.5 g/dL (ref 6.0–8.3)

## 2014-12-07 LAB — CBC
HCT: 33.9 % — ABNORMAL LOW (ref 36.0–46.0)
Hemoglobin: 11.3 g/dL — ABNORMAL LOW (ref 12.0–15.0)
MCH: 29.7 pg (ref 26.0–34.0)
MCHC: 33.3 g/dL (ref 30.0–36.0)
MCV: 89.2 fL (ref 78.0–100.0)
MPV: 9 fL — ABNORMAL LOW (ref 9.4–12.4)
Platelets: 420 10*3/uL — ABNORMAL HIGH (ref 150–400)
RBC: 3.8 MIL/uL — ABNORMAL LOW (ref 3.87–5.11)
RDW: 14 % (ref 11.5–15.5)
WBC: 6.6 10*3/uL (ref 4.0–10.5)

## 2014-12-07 LAB — GLUCOSE TOLERANCE, 1 HOUR (50G) W/O FASTING: Glucose, 1 Hour GTT: 140 mg/dL (ref 70–140)

## 2014-12-07 LAB — RPR

## 2014-12-07 LAB — HIV ANTIBODY (ROUTINE TESTING W REFLEX): HIV 1&2 Ab, 4th Generation: NONREACTIVE

## 2014-12-08 ENCOUNTER — Telehealth: Payer: Self-pay | Admitting: *Deleted

## 2014-12-08 NOTE — Telephone Encounter (Signed)
-----   Message from Adam PhenixJames G Arnold, MD sent at 12/08/2014  2:32 PM EST ----- Needs 3 hr GTT

## 2014-12-08 NOTE — Telephone Encounter (Signed)
Called Jennifer Holmes with interpreter Jennifer MarlinSilvia Holmes  And explained 1 hr. GTT was elevated and needs 3 hr GTT. Expained instructions to her and she agreed to appointment for 3hr GTT on 12/13/14 at 0800.

## 2014-12-10 NOTE — L&D Delivery Note (Addendum)
Delivery Note Some severe-range BP's while pt pushing and gripping hand rail. Hydralazine ordered, but baby delivered and BP 150/85 immediately after. Hydralazine held. At 1:54 AM a viable female was delivered via VBAC, Spontaneous (Presentation: Left Occiput Anterior).  APGAR: 8, 9; weight pending.   Placenta status: Intact, Spontaneous.  Cord: 3 vessels with the following complications: None.  Cord pH: NA  Anesthesia: Local  Episiotomy: None Lacerations: 2nd degree and tiny bilat upper labial lacs requiring figure-of-eight sutures for hemostasis.  Suture Repair: 2.0 vicryl rapide Est. Blood Loss (mL): 500  Mom to AICU for Mag Sulfate.  Baby to Couplet care / Skin to Skin. Placenta to: pathology Feeding: Breast/bottle Circ: NA Contraception: Condoms  Jennifer Holmes 01/24/2015, 3:02 AM

## 2014-12-13 ENCOUNTER — Other Ambulatory Visit: Payer: Self-pay

## 2014-12-13 DIAGNOSIS — R7309 Other abnormal glucose: Secondary | ICD-10-CM

## 2014-12-14 LAB — GLUCOSE TOLERANCE, 3 HOURS
Glucose Tolerance, 1 hour: 174 mg/dL (ref 70–189)
Glucose Tolerance, 2 hour: 111 mg/dL (ref 70–164)
Glucose Tolerance, Fasting: 86 mg/dL (ref 70–104)
Glucose, GTT - 3 Hour: 90 mg/dL (ref 70–144)

## 2014-12-16 ENCOUNTER — Telehealth: Payer: Self-pay | Admitting: *Deleted

## 2014-12-16 NOTE — Telephone Encounter (Signed)
Called patient with Interpreter Rubye BeachAdriana Holmes. Notified her of normal 3 hr GTT results and reminded her of next appointment. No questions voiced.

## 2014-12-16 NOTE — Telephone Encounter (Signed)
-----   Message from Adam PhenixJames G Arnold, MD sent at 12/16/2014  8:48 AM EST ----- Normal result

## 2014-12-17 ENCOUNTER — Encounter (HOSPITAL_COMMUNITY): Payer: Self-pay

## 2014-12-17 ENCOUNTER — Inpatient Hospital Stay (HOSPITAL_COMMUNITY)
Admission: AD | Admit: 2014-12-17 | Discharge: 2014-12-17 | Disposition: A | Discharge status: Home / Self Care | Payer: Self-pay | Source: Ambulatory Visit | Attending: Obstetrics and Gynecology | Admitting: Obstetrics and Gynecology

## 2014-12-17 DIAGNOSIS — O4703 False labor before 37 completed weeks of gestation, third trimester: Secondary | ICD-10-CM

## 2014-12-17 DIAGNOSIS — Z3A29 29 weeks gestation of pregnancy: Secondary | ICD-10-CM | POA: Insufficient documentation

## 2014-12-17 DIAGNOSIS — O479 False labor, unspecified: Secondary | ICD-10-CM

## 2014-12-17 DIAGNOSIS — O10013 Pre-existing essential hypertension complicating pregnancy, third trimester: Secondary | ICD-10-CM | POA: Insufficient documentation

## 2014-12-17 HISTORY — DX: Other specified postprocedural states: R11.2

## 2014-12-17 HISTORY — DX: Nausea with vomiting, unspecified: R11.2

## 2014-12-17 HISTORY — DX: Headache, unspecified: R51.9

## 2014-12-17 HISTORY — DX: Other specified postprocedural states: Z98.890

## 2014-12-17 HISTORY — DX: Headache: R51

## 2014-12-17 LAB — FETAL FIBRONECTIN: Fetal Fibronectin: NEGATIVE

## 2014-12-17 LAB — URINALYSIS, ROUTINE W REFLEX MICROSCOPIC
Bilirubin Urine: NEGATIVE
Glucose, UA: NEGATIVE mg/dL
Hgb urine dipstick: NEGATIVE
Ketones, ur: NEGATIVE mg/dL
Leukocytes, UA: NEGATIVE
Nitrite: NEGATIVE
Protein, ur: NEGATIVE mg/dL
Specific Gravity, Urine: 1.01 (ref 1.005–1.030)
Urobilinogen, UA: 0.2 mg/dL (ref 0.0–1.0)
pH: 6.5 (ref 5.0–8.0)

## 2014-12-17 LAB — WET PREP, GENITAL
Clue Cells Wet Prep HPF POC: NONE SEEN
Trich, Wet Prep: NONE SEEN
Yeast Wet Prep HPF POC: NONE SEEN

## 2014-12-17 MED ORDER — LACTATED RINGERS IV BOLUS (SEPSIS)
1000.0000 mL | Freq: Once | INTRAVENOUS | Status: AC
  Administered 2014-12-17: 1000 mL via INTRAVENOUS

## 2014-12-17 NOTE — MAU Provider Note (Signed)
History     CSN: [id number]  Arrival date and time: 12/17/14 1857   None     Chief Complaint  Patient presents with  . Abdominal Pain   HPI Jennifer Holmes is a 28yo G2P1001 @ 29.5wks by 12wk U/S presenting for eval of irreg ctx x approx 24 hrs starting after she had some episodes of vomiting. Denies diarrhea. No leaking or bldg. Her preg has been followed by the [facility] and has been remarkable for 1) cHTN- managed on antihypertensives 2) elevated LFTs with hx of fatty liver 3) prev C/S with desire for TOLAC  OB History    Gravida Para Term Preterm AB TAB SAB Ectopic Multiple Living   0 0 0 0 0 0 1      Obstetric Comments   Had a blood transfusion with first pregnancy after her C/S in [place].      Past Medical History  Diagnosis Date  . Hypertension   . GERD (gastroesophageal reflux disease)   . PONV (postoperative nausea and vomiting)   . Blood transfusion without reported diagnosis   . Headache     Past Surgical History  Procedure Laterality Date  . Cesarean section      Family History  Problem Relation Age of Onset  . Hypertension Mother   . Heart disease Mother   . Hypertension Brother     History  Substance Use Topics  . Smoking status: Never Smoker   . Smokeless tobacco: Never Used  . Alcohol Use: No    Allergies:  Allergies  Allergen Reactions  . Lactose Intolerance (Gi)     Gi upset    Prescriptions prior to admission  Medication Sig Dispense Refill Last Dose  . aspirin EC 81 MG tablet Take 1 tablet (81 mg total) by mouth daily. For prevention of preeclampsia 200 tablet 0 12/17/2014 at Unknown time  . flintstones complete (FLINTSTONES) 60 MG chewable tablet Chew 2 tablets by mouth daily.   12/17/2014 at Unknown time  . methyldopa (ALDOMET) 500 MG tablet Take 1 tablet (500 mg total) by mouth 2 (two) times daily. 60 tablet 1 12/17/2014 at Unknown time  . pantoprazole (PROTONIX) 40 MG tablet Take 1 tablet (40 mg total) by mouth daily. 30 tablet 1 12/17/2014  at Unknown time  . butalbital-acetaminophen-caffeine (FIORICET) 50-325-40 MG per tablet Take 1-2 tablets by mouth every 6 (six) hours as needed for headache. 20 tablet 0 more than one month  . promethazine (PHENERGAN) 25 MG tablet Take 1 tablet (25 mg total) by mouth every 6 (six) hours as needed for nausea or vomiting. 30 tablet 0 more than one month    ROS Physical Exam   Weight 84.426 kg (186 lb 2 oz), last menstrual period 05/14/2014.  Physical Exam  Constitutional: She is oriented to person, place, and time. She appears well-developed.  HENT:  Head: Normocephalic.  Neck: Normal range of motion.  Cardiovascular: Normal rate.   Respiratory: Effort normal.  GI:  EFM 130s, +accels, no decels Rare ctx  Genitourinary: Vagina normal.  Exams x 2: FT/thick/post  Musculoskeletal: Normal range of motion.  Neurological: She is alert and oriented to person, place, and time.  Skin: Skin is warm and dry.  Psychiatric: She has a normal mood and affect. Her behavior is normal. Thought content normal.   Urinalysis    Component Value Date/Time   COLORURINE YELLOW 12/17/2014 1910   APPEARANCEUR CLEAR 12/17/2014 1910   LABSPEC 1.010 12/17/2014 1910   PHURINE  6.5 12/17/2014 1910   GLUCOSEU NEGATIVE 12/17/2014 1910   HGBUR NEGATIVE 12/17/2014 1910   BILIRUBINUR NEGATIVE 12/17/2014 1910   KETONESUR NEGATIVE 12/17/2014 1910   PROTEINUR NEGATIVE 12/17/2014 1910   UROBILINOGEN 0.2 12/17/2014 1910   NITRITE NEGATIVE 12/17/2014 1910   LEUKOCYTESUR NEGATIVE 12/17/2014 1910   Wet prep: few WBC, few bact FFN: neg  MAU Course  Procedures    Assessment and Plan  IUP@29 .5wks Rare braxton hicks ctx  D/C home with preterm labor precautions GC/chlam pending [facility]  Cam HaiSHAW, KIMBERLY CNM 12/17/2014, 9:49 PM

## 2014-12-17 NOTE — Discharge Instructions (Signed)
Informacin sobre el parto prematuro  (Preterm Labor Information) El parto prematuro comienza antes de la semana 37 de embarazo. La duracin de un embarazo normal es de 39 a 41 semanas.  CAUSAS  Generalmente no hay una causa que pueda identificarse del motivo por el que una mujer comienza un trabajo de parto prematuro. Sin embargo, una de las causas conocidas ms frecuentes son las infecciones. Las infecciones del tero, el cuello, la vagina, el lquido amnitico, la vejiga, los riones y hasta de los pulmones (neumona) pueden hacer que el trabajo de parto se inicie. Otras causas que pueden sospecharse son:   Infecciones urogenitales, como infecciones por hongos y vaginosis bacteriana.   Anormalidades uterinas (forma del tero, sptum uterino, fibromas, hemorragias en la placenta).   Un cuello que ha sido operado (puede ser que no permanezca cerrado).   Malformaciones del feto.   Gestaciones mltiples (mellizos, trillizos y ms).   Ruptura del saco amnitico.  FACTORES DE RIESGO   Historia previa de parto prematuro.   Tener ruptura prematura de las membranas (RPM).   La placenta cubre la abertura del cuello (placenta previa).   La placenta se separa del tero (abrupcin placentaria).   El cuello es demasiado dbil para contener al beb en el tero (cuello incompetente).   Hay mucho lquido en el saco amnitico (polihidramnios).   Consumo de drogas o hbito de fumar durante el embarazo.   No aumentar de peso lo suficiente durante el embarazo.   Mujeres menores de 18 aos o mayores de 35 aos.   Nivel socioeconmico bajo.   Pertenecer a la raza afroamericana. SNTOMAS  Los signos y sntomas del trabajo de parto prematuro son:   Clicos similares a los menstruales, dolor abdominal o dolor de espalda.  Contracciones uterinas regulares, tan frecuentes como seis por hora, sin importar su intensidad (pueden ser suaves o dolorosas).  Contracciones que comienzan  en la parte superior del tero y se expanden hacia abajo, a la zona inferior del abdomen y la espalda.   Sensacin de aumento de presin en la pelvis.   Aparece una secrecin acuosa o sanguinolenta por la vagina.  TRATAMIENTO  Segn el tiempo del embarazo y otras circunstancias, el mdico puede indicar reposo en cama. Si es necesario, le indicarn medicamentos para detener las contracciones y para madurar los pulmones del feto. Si el trabajo de parto se inicia antes de las 34 semanas de embarazo, se recomienda la hospitalizacin. El tratamiento depende de las condiciones en que se encuentren usted y el feto.  QU DEBE HACER SI PIENSA QUE EST EN TRABAJO DE PARTO PREMATURO?  Comunquese con su mdico inmediatamente. Debe concurrir al hospital para ser controlada inmediatamente.  CMO PUEDE EVITAR EL TRABAJO DE PARTO PREMATURO EN FUTUROS EMBARAZOS?  Usted debe:   Si fuma, abandonar el hbito.  Mantener un peso saludable y evitar sustancias qumicas y drogas innecesarias.  Controlar todo tipo de infeccin.  Informe a su mdico si tiene una historia conocida de trabajo de parto prematuro. Document Released: 03/04/2008 Document Revised: 07/29/2013 [employer] Patient Information 2015 [employer]. This information is not intended to replace advice given to you by your health care provider. Make sure you discuss any questions you have with your health care provider.  

## 2014-12-17 NOTE — MAU Note (Signed)
On Thursday had nausea and vomiting but it stopped.  Today, been having abd pain and Feels like baby coming down like pressure and it is hard to make pee. It also burns to pee. Denies leaking and no bleeding.

## 2014-12-20 ENCOUNTER — Ambulatory Visit (HOSPITAL_COMMUNITY)
Admission: RE | Admit: 2014-12-20 | Discharge: 2014-12-20 | Disposition: A | Discharge status: Home / Self Care | Payer: Self-pay | Source: Ambulatory Visit | Attending: Obstetrics & Gynecology | Admitting: Obstetrics & Gynecology

## 2014-12-20 ENCOUNTER — Ambulatory Visit (INDEPENDENT_AMBULATORY_CARE_PROVIDER_SITE_OTHER): Payer: Self-pay | Admitting: Family Medicine

## 2014-12-20 VITALS — BP 119/68 | HR 99 | Temp 97.8°F | Wt 187.5 lb

## 2014-12-20 DIAGNOSIS — Z3A30 30 weeks gestation of pregnancy: Secondary | ICD-10-CM | POA: Insufficient documentation

## 2014-12-20 DIAGNOSIS — O0990 Supervision of high risk pregnancy, unspecified, unspecified trimester: Secondary | ICD-10-CM | POA: Insufficient documentation

## 2014-12-20 DIAGNOSIS — Z3493 Encounter for supervision of normal pregnancy, unspecified, third trimester: Secondary | ICD-10-CM

## 2014-12-20 DIAGNOSIS — R319 Hematuria, unspecified: Secondary | ICD-10-CM

## 2014-12-20 LAB — GC/CHLAMYDIA PROBE AMP
CT Probe RNA: NEGATIVE
GC Probe RNA: NEGATIVE

## 2014-12-20 NOTE — Progress Notes (Signed)
Spanish interpreter: Okey Regalarol used Blood in urine--will check urine Culture Has u/s for growth this am. C/o pelvic pressure and contractions--cervix is thin but closed--PTL precautions Sign VBAC consent next visit--given to pt. today

## 2014-12-20 NOTE — Patient Instructions (Addendum)
Preeclampsia y eclampsia (Preeclampsia and Eclampsia) La preeclampsia es una afeccin grave que solo se manifiesta durante el [place]. Tambin se la conoce como toxemia del Media planner. Esta afeccin provoca el aumento de la presin arterial junto con otros sntomas, como hinchazn y dolores de [place], que pueden aparecer a medida que la preeclampsia empeora. La preeclampsia puede presentarse a las 20semanas de gestacin o posteriormente.  El diagnstico y el tratamiento tempranos de esta afeccin son muy importantes. Si no se la trata de inmediato, puede causarles problemas graves a usted y al beb. Una complicacin que la preeclampsia puede provocar es la eclampsia, una afeccin que causa temblores o contracciones musculares (convulsiones) en la [place]. Provocar el parto del beb es el mejor tratamiento para la preeclampsia o la eclampsia.  FACTORES DE RIESGO La causa de la preeclampsia no se conoce. Puede tener ms probabilidades de desarrollar la afeccin si tiene ciertos factores de Sales executive. Estos incluyen:   Estar embarazada por primera vez.  Haber tenido preeclampsia en un embarazo anterior.  Tener antecedentes familiares de preeclampsia.  Tener presin arterial alta.  Estar embarazada de gemelos o trillizos.  Tener 35aos o ms.  Pertenecer a Advertising copywriter.  Tener enfermedad renal o diabetes.  Tener enfermedades, como lupus o enfermedades de la [place].  Tener mucho sobrepeso (obesidad). [place] primeros signos de preeclampsia son:  Hipertensin arterial.  Aumento de las protenas en la orina. El Management consultant en cada visita prenatal. Otros sntomas que pueden presentarse incluyen:   Dolor de cabeza intenso  Aumento repentino de peso.  Hinchazn de las manos, el [place] y Ray[employer]on.  Malestar estomacal (nuseas) y (vmitos).  Problemas visuales (visin doble o borrosa).  Adormecimiento del rostro, los [place], las piernas y  los pies.  Mareos.  Hablar arrastrando las palabras.  Sensibilidad a las luces brillantes.  Dolor abdominal. DIAGNSTICO  No hay pruebas diagnsticas para la preeclampsia. El mdico le har preguntas sobre los sntomas y verificar la presencia de signos de preeclampsia durante las visitas prenatales. Tambin pueden hacerle exmenes que incluyen:  Anlisis de [place].  Anlisis de [place].  Control de la frecuencia cardaca del beb.  Control de la salud del beb y de la placenta mediante el uso de imgenes que se generan con ondas sonoras (ecografa). TRATAMIENTO  Puede planificar el mejor abordaje de tratamiento junto con el mdico. Es muy importante que concurra a todas las visitas prenatales. Si tiene un riesgo ms alto de tener preeclampsia, tal vez necesite exmenes prenatales con ms frecuencia.  El mdico tambin puede indicarle que haga reposo en cama.  Tal vez deba comer la menor cantidad de sal posible.  Es posible que tenga que tomar medicamentos para bajar la presin arterial si la afeccin no responde a medidas ms conservadoras.  Quizs deba [employer] hospital si la afeccin es grave. All, el tratamiento estar centrado en el control de la presin arterial y la retencin de lquidos. Puede que tambin tenga que tomar medicamentos para evitar las convulsiones.  Si la afeccin empeora, tal vez sea necesario adelantar el parto para protegerlos a usted y al beb. Pueden provocarle el trabajo de parto con medicamentos (inducirse) o hacerle una cesrea.  Generalmente, la preeclampsia desaparece despus del parto. INSTRUCCIONES PARA EL CUIDADO EN EL HOGAR   Tome los medicamentos de venta libre o recetados solamente segn las indicaciones del mdico.  Acustese sobre el lado izquierdo mientras hace reposo; de [place], se quita la  presin del beb.  Levante los pies mientras hace reposo.  Realice actividad fsica con regularidad. Consulte al mdico qu tipo de  ejercicio es seguro para usted.  Evite la cafena y el alcohol.  No fume.  Beba de 6 a 8vasos de Public affairs consultant.  Coma una dieta equilibrada con bajo contenido de sal. No agregue sal a las comidas.  Evite las situaciones estresantes tanto como sea posible.  Descanse y duerma lo suficiente.  Concurra a todas las visitas prenatales y hgase todos los anlisis segn lo programado. SOLICITE ATENCIN MDICA SI:  Aumenta de peso ms de lo esperado.  Tiene dolores de cabeza, dolor abdominal o nuseas.  Aparecen ms hematomas que lo normal.  Se siente mareada o tiene vahdos. SOLICITE ATENCIN MDICA DE INMEDIATO SI:   Aparece una hinchazn repentina o tiene una zona muy hinchada en cualquier parte del cuerpo. Esto suele ocurrir en las piernas.  Tiene un aumento de peso de 5libras (2,3kg) o ms en una semana.  Tiene dolor de cabeza intenso, mareos, problemas visuales o confusin.  Siente un dolor abdominal intenso.  Tiene nuseas o vmitos permanentes.  Tiene convulsiones.  Tiene dificultad para mover cualquier parte del cuerpo.  Tiene adormecimiento en el cuerpo.  Tiene dificultad para hablar.  Tiene cualquier sangrado anormal.  Siente rigidez en el cuello.  Se desmaya. ASEGRESE DE QUE:   Comprende estas instrucciones.  Controlar su afeccin.  Recibir ayuda de inmediato si no mejora o si empeora. Document Released: 09/05/2005 Document Revised: 12/01/2013 [facility] Patient Information 2015 [place]. This information is not intended to replace advice given to you by your health care provider. Make sure you discuss any questions you have with your health care provider.  Lactancia materna (Breastfeeding) Decidir Economist es una de las mejores elecciones que puede hacer por usted y su beb. El cambio hormonal durante el Media planner produce el desarrollo del tejido mamario y [place] la cantidad y el tamao de los conductos galactforos. Estas hormonas tambin  permiten que las protenas, los azcares y las grasas de la sangre produzcan la Northeast Utilities materna en las glndulas productoras de [place]. Las hormonas impiden que la leche materna sea liberada antes del nacimiento del beb, adems de impulsar el flujo de leche luego del nacimiento. Una vez que ha comenzado a Economist, Freight forwarder beb, as Therapist, occupational succin o Social research officer, government, pueden estimular la liberacin de [place] de las glndulas productoras de [place].  LOS BENEFICIOS DE AMAMANTAR Para el beb  La primera leche (calostro) ayuda a Garment/textile technologist funcionamiento del sistema digestivo del beb.  La leche tiene anticuerpos que ayudan a Chemical engineer las infecciones en el beb.  El beb tiene una menor incidencia de asma, alergias y del sndrome de muerte sbita del lactante.  Los nutrientes en la [place] materna son mejores para el beb que la [place] maternizada y estn preparados exclusivamente para cubrir las necesidades del beb.  La leche materna mejora el desarrollo cerebral del beb.  Es menos probable que el beb desarrolle otras enfermedades, como obesidad infantil, asma o diabetes mellitus de tipo 2. Para usted   La lactancia materna favorece el desarrollo de un vnculo muy especial entre la madre y el beb.  Es conveniente. La leche materna siempre est disponible a la Tree surgeon y es [place].  La lactancia materna ayuda a quemar caloras y a perder el peso ganado durante el [place].  Favorece la contraccin del tero al tamao que tena antes del embarazo de manera ms  rpida y Enbridge Energy sangrado (loquios) despus del parto.  La lactancia materna contribuye a reducir Catering manager de desarrollar diabetes mellitus de tipo 2, osteoporosis o cncer de mama o de ovario en el futuro. SIGNOS DE QUE EL BEB EST HAMBRIENTO Primeros signos de hambre  Aumenta su estado de [place].  Se estira.  Mueve la cabeza de un lado a otro.  Mueve la cabeza y abre la boca cuando se le toca la  mejilla o la comisura de la boca (reflejo de bsqueda).  [place] vocalizaciones, tales como sonidos de succin, se relame los labios, emite arrullos, suspiros, o chirridos.  Mueve la [employer] boca.  Se chupa con ganas los dedos o las manos. Signos tardos de [employer] Financial.  Llora de manera intermitente. Signos de BJ[employer] Wholesale signos de hambre extrema requerirn que lo calme y lo consuele antes de que el beb pueda alimentarse adecuadamente. No espere a que se manifiesten los siguientes signos de hambre extrema para comenzar a Economist:   Air cabin crew.  Llanto intenso y fuerte.   Gritos. INFORMACIN BSICA SOBRE LA LACTANCIA MATERNA Iniciacin de la lactancia materna  Encuentre un lugar cmodo para sentarse o acostarse, con un buen respaldo para el cuello y la espalda.  Coloque una almohada o una manta enrollada debajo del beb para acomodarlo a la altura de la mama (si est sentada). Las almohadas para Economist se han diseado especialmente a fin de servir de apoyo para los brazos y el beb Kellogg.  Asegrese de que el abdomen del beb est frente al suyo.  Masajee suavemente la mama. Con las yemas de los dedos, masajee la pared del pecho hacia el pezn en un movimiento circular. Esto estimula el flujo de [place]. Es posible que Oceanographer este movimiento mientras amamanta si la leche fluye lentamente.  Sostenga la mama con el pulgar por arriba del pezn y los otros 4 dedos por debajo de la mama. Asegrese de que los dedos se encuentren lejos del pezn y de la boca del beb.  Empuje suavemente los labios del beb con el pezn o con el dedo.  Cuando la boca del beb se abra lo suficiente, acrquelo rpidamente a la mama e introduzca todo el pezn y la zona oscura que lo rodea (areola), tanto como sea posible, dentro de la boca del beb.  Debe haber ms areola visible por arriba del labio superior del beb que por debajo del labio inferior.  La  lengua del beb debe estar entre la enca inferior y la [place].  Asegrese de que la boca del beb est en la posicin correcta alrededor del pezn (prendida). Los labios del beb deben crear un sello sobre la mama y estar doblados hacia afuera (invertidos).  Es comn que el beb succione durante 2 a 3 minutos para que comience el flujo de [place]. Cmo debe prenderse Es muy importante que le ensee al beb cmo prenderse adecuadamente a la mama. Si el beb no se prende adecuadamente, puede causarle dolor en el pezn y reducir la produccin de [place], y hacer que el beb tenga un escaso aumento de [place]. Adems, si el beb no se prende adecuadamente al pezn, puede tragar aire durante la alimentacin. Esto puede causarle molestias al beb. Hacer eructar al beb al Eliezer Lofts de mama puede ayudarlo a liberar el aire. Sin embargo, ensearle al beb cmo prenderse a la mama adecuadamente es la mejor manera de evitar que se sienta molesto  por tragar Pilar Jarvis se alimenta. Signos de que el beb se ha prendido adecuadamente al pezn:   Hall Busing o succiona de modo silencioso, sin causarle dolor.  Se escucha que traga cada 3 o 4 succiones.   Hay movimientos musculares por arriba y por delante de sus odos al Mining engineer. Signos de que el beb no se ha prendido Product manager al pezn:   Hace ruidos de succin o de chasquido mientras se alimenta.  Siente dolor en el pezn. Si cree que el beb no se prendi correctamente, deslice el dedo en la comisura de la boca y Micron Technology las encas del beb para interrumpir la succin. Intente comenzar a amamantar nuevamente. Signos de Economist Signos del beb:   Disminuye gradualmente el nmero de succiones o cesa la succin por completo.  Se duerme.  Relaja el cuerpo.  Retiene una pequea cantidad de [employer] Corporation boca.  Se desprende solo del pecho. Signos que presenta usted:  Las mamas han aumentado la firmeza, el peso y el  tamao 1 a 3 horas despus de Economist.  Estn ms blandas inmediatamente despus de amamantar.  Un aumento del volumen de [place], y tambin un cambio en su consistencia y color se producen hacia el [place] de Therapist, nutritional.  Los pezones no duelen, ni estn agrietados ni sangran. Signos de que su beb recibe la cantidad de leche suficiente  Moja al menos 3 paales en 24 horas. La orina debe ser clara y de color amarillo plido a los [place].  Defeca al menos 3 veces en 24 horas a los 5 das de vida. La materia fecal debe ser blanda y [place].  Defeca al menos 3 veces en 24 horas a los 7 das de vida. La materia fecal debe ser grumosa y [place].  No registra una prdida de peso mayor del 10% del peso al nacer durante los primeros [place].  Aumenta de peso un promedio de 4 a 7onzas (113 a 198g) por semana despus de los [place].  Aumenta de [place], [place], de [place] uniforme a Proofreader de los 5 das de vida, sin Museum/gallery curator prdida de peso despus de las 2semanas de vida. Despus de alimentarse, es posible que el beb regurgite una pequea cantidad. Esto es frecuente. FRECUENCIA Y DURACIN DE LA LACTANCIA MATERNA El amamantamiento frecuente la ayudar a producir ms [place] y a Warden/ranger de Social research officer, government en los pezones e hinchazn en las [place]. Alimente al beb cuando muestre signos de hambre o si siente la necesidad de reducir la congestin de las [place]. Esto se denomina "lactancia a demanda". Evite el uso del chupete mientras trabaja para establecer la lactancia (las primeras 4 a 6 semanas despus del nacimiento del beb). Despus de este perodo, podr ofrecerle un chupete. Las investigaciones demostraron que el uso del chupete durante el primer ao de vida del beb disminuye el riesgo de desarrollar el sndrome de muerte sbita del lactante (SMSL). Permita que el nio se alimente en cada mama todo lo que desee. Contine amamantando al beb hasta que haya  terminado de alimentarse. Cuando el beb se desprende o se queda dormido mientras se est alimentando de la primera mama, ofrzcale la segunda. Debido a que, con frecuencia, los recin [place] [employer]akes las primeras semanas de vida, es posible que deba despertar al beb para alimentarlo. Los horarios de Writer de un beb a otro. Sin embargo, las siguientes reglas pueden servir como gua para Engineer, maintenance (IT)  a garantizar que el beb se alimenta adecuadamente:  Se puede amamantar a los recin nacidos (bebs de 4 semanas o menos de vida) cada 1 a 3 horas.  No deben transcurrir ms de 3 horas durante el da o 5 horas durante la noche sin que se amamante a los recin nacidos.  Debe amamantar al beb 8 veces como mnimo en un perodo de 24 horas, hasta que comience a introducir slidos en su dieta, a los 6 meses de vida aproximadamente. Corning extraccin y Recruitment consultant de la leche materna le permiten asegurarse de que el beb se alimente exclusivamente de [place], aun en momentos en los que no puede amamantar. Esto tiene especial importancia si debe regresar al [place] en el perodo en que an est amamantando o si no puede estar presente en los momentos en que el beb debe alimentarse. Su asesor en lactancia puede orientarla sobre cunto tiempo es [place].  El sacaleche es un aparato que le permite extraer leche de la mama a un recipiente estril. Luego, la leche materna extrada puede almacenarse en un refrigerador o Pension scheme manager. Algunos sacaleches son Theodore Demark, James Ivanoff otros son elctricos. Consulte a su asesor en lactancia qu tipo ser ms conveniente para usted. Los sacaleches se pueden comprar; sin embargo, algunos hospitales y grupos de apoyo a la lactancia materna alquilan Production assistant, radio. Un asesor en lactancia puede ensearle cmo extraer OfficeMax Incorporated, en caso de que prefiera no usar un sacaleche.    CMO CUIDAR LAS MAMAS DURANTE LA LACTANCIA MATERNA Los pezones se secan, agrietan y duelen durante la Therapist, nutritional. Las siguientes recomendaciones pueden ayudarla a Theatre manager las [employer] y sanas:  Art therapist usar jabn en los pezones.  Use un sostn de soporte. Aunque no son esenciales, las camisetas sin mangas o los sostenes especiales para Economist estn diseados para acceder fcilmente a las mamas, para Economist sin tener que quitarse todo el sostn o la camiseta. Evite usar sostenes con aro o sostenes muy ajustados.  Seque al aire sus pezones durante 3 a 87minutos despus de amamantar al beb.  Utilice solo apsitos de Chiropodist sostn para Tax adviser las prdidas de [place]. La prdida de un poco de Owens Corning tomas es normal.  Utilice lanolina sobre los pezones luego de Economist. La lanolina ayuda a mantener la humedad normal de la piel. Si [place] lanolina pura, no tiene que lavarse los pezones antes de volver a Research scientist (life sciences) al beb. La lanolina pura no es txica para el beb. Adems, puede extraer Cisco algunas gotas de [place] materna y Community education officer suavemente esa [employer], para que la [place] se seque al aire. Durante las primeras semanas despus de dar a luz, algunas mujeres pueden experimentar hinchazn en las mamas (congestin [place]). La congestin puede hacer que sienta las mamas pesadas, calientes y sensibles al tacto. El pico de la congestin ocurre dentro de los 3 a 5 das despus del [place]. Las siguientes recomendaciones pueden ayudarla a Public house manager la congestin:  Vace por completo las mamas al [place]. Puede aplicar calor hmedo en las mamas (en la ducha o con toallas hmedas para manos) antes de Economist o extraer Northeast Utilities. Esto aumenta la circulacin y [place] a que la [place]. Si el beb no vaca por completo las mamas cuando lo amamanta, extraiga la [place] restante despus de que haya finalizado.  Use un sostn ajustado (para  amamantar o comn) o una camiseta sin Engineer, agricultural  durante 1 o 2 das para indicar al cuerpo que disminuya ligeramente la produccin de [place]le[place].  Aplique compresas de hielo Yahoo! Incsobre las mamas, a menos que le resulte demasiado incmodo.  Asegrese de que el beb est prendido y se encuentre en la posicin correcta mientras lo alimenta. Si la congestin persiste luego de 48 horas o despus de seguir estas recomendaciones, comunquese con su mdico o un Holiday representativeasesor en lactancia. RECOMENDACIONES GENERALES PARA EL CUIDADO DE LA SALUD DURANTE LA LACTANCIA MATERNA  Consuma alimentos saludables. Alterne comidas y colaciones, y coma 3 de cada una por da. Dado que lo que come Danaher Corporationafecta la leche materna, es posible que algunas comidas hagan que su beb se vuelva ms irritable de lo habitual. Evite comer este tipo de alimentos si percibe que afectan de manera negativa al beb.  Beba leche, jugos de fruta y agua para Patent examinersatisfacer su sed (aproximadamente 10 vasos al Futures traderda).  Descanse con frecuencia, reljese y tome sus vitaminas prenatales para evitar la fatiga, el estrs y la anemia.  Contine con los autocontroles de la mama.  Evite masticar y fumar tabaco.  Evite el consumo de alcohol y drogas. Algunos medicamentos, que pueden ser perjudiciales para el beb, pueden pasar a travs de la Colgate Palmoliveleche materna. Es importante que consulte a su mdico antes de Medical sales representativetomar cualquier medicamento, incluidos todos los medicamentos recetados y de Muns[place] Cornersventa libre, as como los suplementos vitamnicos y herbales. Puede quedar embarazada durante la lactancia. Si desea controlar la natalidad, consulte a su mdico cules son las opciones ms seguras para el beb. SOLICITE ATENCIN MDICA SI:   Usted siente que quiere dejar de Museum/gallery exhibitions officeramamantar o se siente frustrada con la lactancia.  Siente dolor en las mamas o en los pezones.  Sus pezones estn agrietados o Water quality scientistsangran.  Sus pechos estn irritados, sensibles o calientes.  Tiene un rea hinchada en cualquiera de  las mamas.  Siente escalofros o fiebre.  Tiene nuseas o vmitos.  Presenta una secrecin de otro lquido distinto de la leche materna de los pezones.  Sus mamas no se llenan antes de Museum/gallery exhibitions officeramamantar al beb para el quinto da despus del Centerportparto.  Se siente triste y deprimida.  El beb est demasiado somnoliento como para comer bien.  El beb tiene problemas para dormir.  Moja menos de 3 paales en 24 horas.  Defeca menos de 3 veces en 24 horas.  La piel del beb o la parte blanca de los ojos se vuelven amarillentas.  El beb no ha aumentado de Centerpeso a los [address] das de [place]vida. SOLICITE ATENCIN MDICA DE INMEDIATO SI:   El beb est muy cansado Retail buyer(letargo) y no se quiere despertar para comer.  Le sube la fiebre sin causa. Document Released: 11/26/2005 Document Revised: 12/01/2013 [facility] Patient Information 2015 [place] BeachExitCare, MarylandLLC. This information is not intended to replace advice given to you by your health care provider. Make sure you discuss any questions you have with your health care provider. Vaginal Birth After Cesarean Delivery Vaginal birth after cesarean delivery (VBAC) is giving birth vaginally after previously delivering a baby by a cesarean. In the past, if a woman had a cesarean delivery, all births afterward would be done by cesarean delivery. This is no longer true. It can be safe for the mother to try a vaginal delivery after having a cesarean delivery.  It is important to discuss VBAC with your health care provider early in the pregnancy so you can understand the risks, benefits, and options. It will give you time to  decide what is best in your particular case. The final decision about whether to have a VBAC or repeat cesarean delivery should be between you and your health care provider. Any changes in your health or your baby's health during your pregnancy may make it necessary to change your initial decision about VBAC.  WOMEN WHO PLAN TO HAVE A VBAC SHOULD CHECK WITH THEIR  HEALTH CARE PROVIDER TO BE SURE THAT: The previous cesarean delivery was done with a low transverse uterine cut (incision) (not a vertical classical incision).  The birth canal is big enough for the baby.  There were no other operations on the uterus.  An electronic fetal monitor (EFM) will be on at all times during labor.  An operating room will be available and ready in case an emergency cesarean delivery is needed.  A health care provider and surgical nursing staff will be available at all times during labor to be ready to do an emergency delivery cesarean if necessary.  An anesthesiologist will be present in case an emergency cesarean delivery is needed.  The nursery is prepared and has adequate personnel and necessary equipment available to care for the baby in case of an emergency cesarean delivery. BENEFITS OF VBAC Shorter stay in the hospital.  Avoidance of risks associated with cesarean delivery, such as: Surgical complications, such as opening of the incision or hernia in the incision. Injury to other organs. Fever. This can occur if an infection develops after surgery. It can also occur as a reaction to the medicine given to make you numb during the surgery. Less blood loss and need for blood transfusions. Lower risk of blood clots and infection. Shorter recovery.  Decreased risk for having to remove the uterus (hysterectomy).  Decreased risk for the placenta to completely or partially cover the opening of the uterus (placenta previa) with a future pregnancy.  Decrease risk in future labor and delivery. RISKS OF A VBAC Tearing (rupture) of the uterus. This is occurs in less than 1% of VBACs. The risk of this happening is higher if: Steps are taken to begin the labor process (induce labor) or stimulate or strengthen contractions (augment labor).  Medicine is used to soften (ripen) the cervix. Having to remove the uterus (hysterectomy) if it ruptures. VBAC SHOULD NOT  BE DONE IF: The previous cesarean delivery was done with a vertical (classical) or T-shaped incision or you do not know what kind of incision was made.  You had a ruptured uterus.  You have had certain types of surgery on your uterus, such as removal of uterine fibroids. Ask your health care provider about other types of surgeries that prevent you from having a VBAC. You have certain medical or childbirth (obstetrical) problems.  There are problems with the baby.  You have had two previous cesarean deliveries and no vaginal deliveries. OTHER FACTS TO KNOW ABOUT VBAC: It is safe to have an epidural anesthetic with VBAC.  It is safe to turn the baby from a breech position (attempt an external cephalic version).  It is safe to try a VBAC with twins.  VBAC may not be successful if your baby weights 8.8 lb (4 kg) or more. However, weight predictions are not always accurate and should not be used alone to decide if VBAC is right for you. There is an increased failure rate if the time between the cesarean delivery and VBAC is less than 19 months.  Your health care provider may advise against a VBAC if you have  preeclampsia (high blood pressure, protein in the urine, and swelling of face and extremities).  VBAC is often successful if you previously gave birth vaginally.  VBAC is often successful when the labor starts spontaneously before the due date.  Delivering a baby through a VBAC is similar to having a normal spontaneous vaginal delivery. Document Released: 05/19/2007 Document Revised: 04/12/2014 Document Reviewed: 06/25/2013 [facility] Patient Information 2015 [place], [place]. This information is not intended to replace advice given to you by your health care provider. Make sure you discuss any questions you have with your health care provider.

## 2014-12-20 NOTE — Progress Notes (Signed)
Used interpreter Jennifer HongMariaElena Holmes.. C/o feeling a lot of pelvic pressure since 12/16/14, went to [facility]U 12/17/14.c/o still feels pressure.

## 2014-12-22 LAB — POCT URINALYSIS DIP (DEVICE)
Bilirubin Urine: NEGATIVE
Glucose, UA: NEGATIVE mg/dL
Ketones, ur: NEGATIVE mg/dL
Nitrite: NEGATIVE
Protein, ur: NEGATIVE mg/dL
Specific Gravity, Urine: 1.02 (ref 1.005–1.030)
Urobilinogen, UA: 0.2 mg/dL (ref 0.0–1.0)
pH: 7 (ref 5.0–8.0)

## 2014-12-23 ENCOUNTER — Telehealth: Payer: Self-pay

## 2014-12-23 DIAGNOSIS — N390 Urinary tract infection, site not specified: Secondary | ICD-10-CM

## 2014-12-23 MED ORDER — CEPHALEXIN 500 MG PO CAPS: 500.0000 mg | ORAL_CAPSULE | Freq: Three times a day (TID) | ORAL | Status: DC

## 2014-12-23 NOTE — Telephone Encounter (Signed)
-----   Message from Reva Boresanya S Pratt, MD sent at 12/23/2014 10:07 AM EST ----- Has UTI--needs ABX Keflex 500 mg tid x 7 day # 21, no RF

## 2014-12-23 NOTE — Telephone Encounter (Signed)
Keflex 500 TIDX7 days e-prescribed. Called patient with spanish interpreter Rubye BeachAdriana Martinez. Informed patient of UTI and RX at pharmacy. Patient verbalized understanding. No questions or concerns.

## 2014-12-24 LAB — CULTURE, OB URINE: Colony Count: >=100000

## 2014-12-27 ENCOUNTER — Ambulatory Visit (INDEPENDENT_AMBULATORY_CARE_PROVIDER_SITE_OTHER): Payer: Self-pay | Admitting: Obstetrics & Gynecology

## 2014-12-27 VITALS — BP 124/79 | HR 89 | Temp 97.7°F | Wt 187.0 lb

## 2014-12-27 DIAGNOSIS — B373 Candidiasis of vulva and vagina: Secondary | ICD-10-CM

## 2014-12-27 DIAGNOSIS — O163 Unspecified maternal hypertension, third trimester: Secondary | ICD-10-CM

## 2014-12-27 DIAGNOSIS — O26893 Other specified pregnancy related conditions, third trimester: Secondary | ICD-10-CM

## 2014-12-27 DIAGNOSIS — B3731 Acute candidiasis of vulva and vagina: Secondary | ICD-10-CM

## 2014-12-27 DIAGNOSIS — O34219 Maternal care for unspecified type scar from previous cesarean delivery: Secondary | ICD-10-CM

## 2014-12-27 DIAGNOSIS — N898 Other specified noninflammatory disorders of vagina: Secondary | ICD-10-CM

## 2014-12-27 DIAGNOSIS — O3421 Maternal care for scar from previous cesarean delivery: Secondary | ICD-10-CM

## 2014-12-27 LAB — POCT URINALYSIS DIP (DEVICE)
Bilirubin Urine: NEGATIVE
Glucose, UA: NEGATIVE mg/dL
Hgb urine dipstick: NEGATIVE
Ketones, ur: NEGATIVE mg/dL
Nitrite: NEGATIVE
Protein, ur: NEGATIVE mg/dL
Specific Gravity, Urine: 1.015 (ref 1.005–1.030)
Urobilinogen, UA: 0.2 mg/dL (ref 0.0–1.0)
pH: 7 (ref 5.0–8.0)

## 2014-12-27 NOTE — Patient Instructions (Signed)
Informacin sobre la prueba de Centraltrabajo de parto despus de Neomia Dearuna cesrea (Trial of Labor After Cesarean Delivery Information) La prueba de Nappaneetrabajo de parto despus de una cesrea (TOLAC por sus siglas en ingls) es cuando una mujer trata de dar a luz por va vaginal despus de una cesrea anterior. La TOLAC puede ser Neomia Dearuna opcin segura y [place] Islandsadecuada segn la historia clnica y otros factores de Seldovia Villageriesgo. Cuando TOLAC tiene xito y es capaz de tener un parto vaginal, esto se llama un parto vaginal despus de Neomia Dearuna cesrea (PVDC).  CANDIDATAS PARA TOLAC Este procedimiento es posible para algunas mujeres que:  Hayan sido sometidas a uno o dos partos por cesrea en los que la incisin del tero fue horizontal (transversal baja).  Estn esperando gemelos y tuvieron una incisin transversal baja durante una cesrea.  No tienen una cicatriz uterina vertical (clsica).  No han tenido una ruptura en la pared del tero (ruptura uterina). El TOLAC tambin puede intentarse en mujeres que cumplen con los criterios apropiados:  Son menores de [address] aos.  Son altas y tienen un ndice de masa corporal [facility] inferior a 30.  . Fredderick Phenixienen una cicatriz uterina desconocida.  Dan a luz en un centro equipado para realizar un parto por cesrea de emergencia. Este equipo debe ser capaz de Company secretarymanejar las posibles complicaciones, como una ruptura uterina.  Tienen asesoramiento completo United Stationerssobre los beneficios y riesgos del TOLAC.  Han comentado acerca de futuros planes de embarazo con su mdico.  Han planificado tener varios embarazos ms. CANDIDATAS A TENER MS XITO EN TOLAC:  Han tenido un parto vaginal exitoso antes o despus de su parto por cesrea.  Experimentan un trabajo de parto que comienza, naturalmente, en o antes de la fecha estimada (40 semanas de gestacin).  El beb no es muy grande ( macrosmico).   Han tenido un parto por cesrea anterior, pero actualmente no hay factores que promoveran un parto por cesrea  (como una posicin de Dilkonnalgas).  Han tenido un solo parto por cesrea anteriormente.  Tuvo un parto por cesrea antes de realizar el Saute[place]-Naco[place]etrabajo de parto y no despus de Teacher, English as a foreign languagela dilatacin completa. TOLAC puede ser ms apropiado para mujeres que cumplen con las normas anteriores y que planean tener ms embarazos. No se recomienda en partos domiciliarios. CANDIDATAS A TENER MENOS XITO EN TOLAC:  Tienen un parto inducido con un cuello uterino desfavorable. Un cuello uterino desfavorable es cuando no se dilata lo suficiente (entre otros factores).  Nunca han tenido un parto vaginal.  Han tenido ms de dos partos por cesrea.  Tienen un embarazo de ms de 40 semanas de gestacin.  Est embarazada de un un beb con sospecha de un peso mayor de 4.000 gramos (8  libras) y no tiene antecedentes de un parto vaginal.  Han tenido embarazos muy cercanos. BENEFICIOS SUGERIDOS DE LA TOLAC  Tiempo de recuperacin ms rpido.  Permanencia ms breve en el hospital.  Menos dolor y problemas que en un parto por cesrea. Las AK Steel Holding Corporationmujeres que tienen un parto por cesrea tienen una mayor probabilidad de necesitar sangre o tener fiebre, una infeccin o un cogulo sanguneo en las piernas. RIESGOS SUGERIDOS DE LA TOLAC El riesgo ms alto de complicaciones ocurre en mujeres que intentan un TOLAC y fracasan. Una TOLAC que fracasa resulta en una cesrea no planificada. Los riesgos relacionados con TOLAC o cesreas repetidas son:   Lulu Ridingrdida de sangre.  Infeccin.  Cogulos sanguneos.  Lesiones en los rganos o tejidos circundantes.  Menor riesgo de  remocin del tero (histerectoma).  Posibles problemas con la placenta (como placenta previa o placenta acreta) en embarazos futuros. Aunque es muy raro, las preocupaciones principales con el TOLAC son:  Ruptura de la cicatriz uterina de una cesrea anterior.  Necesidad de una cesrea de Associate Professoremergencia.  Tener un mal resultado para el beb (morbilidad  perinatal). Irven ShellingPARA OBTENER MS INFORMACIN [employer] Corporationmerican College of Obstetricians and Gynecologists (Colegio Estadounidense de Obstetras y Gineclogos): www.acog.org [employer] Corporationmerican College of Nurse-Midwives (Colegio Estadounidense de Enfermeras - parteras): www.[email or web address].org Document Released: 11/15/2011 Document Revised: 09/16/2013 [facility] Patient Information 2015 [place], [place]. This information is not intended to replace advice given to you by your health care provider. Make sure you discuss any questions you have with your health care provider.

## 2014-12-27 NOTE — Progress Notes (Signed)
Patient is Spanish-speaking only, Spanish interpreter present for this encounter. Patient desires TOLAC; counseling done today. Leaning towards TOLAC, gave consent to review at home again, not willing to sign today. Will sign next visit. On exam, no erythema, scant white discharge seen. Wet prep obtained, will follow up results and manage accordingly.  Patient will try Monistat for now. Twice a week testing starting next visit given CHTN on medications No other complaints or concerns.  Labor and fetal movement precautions reviewed.

## 2014-12-27 NOTE — Progress Notes (Signed)
C/o of vaginal itching. Wet prep today.

## 2014-12-28 LAB — WET PREP, GENITAL
Clue Cells Wet Prep HPF POC: NONE SEEN
Trich, Wet Prep: NONE SEEN
WBC, Wet Prep HPF POC: NONE SEEN

## 2014-12-28 MED ORDER — FLUCONAZOLE 150 MG PO TABS: 150.0000 mg | ORAL_TABLET | Freq: Once | ORAL | Status: DC

## 2014-12-28 NOTE — Addendum Note (Signed)
Addended by: Jaynie CollinsANYANWU, Brier Reid A on: 12/28/2014 12:45 PM   Modules accepted: Orders

## 2014-12-29 ENCOUNTER — Telehealth: Payer: Self-pay | Admitting: General Practice

## 2014-12-29 NOTE — Telephone Encounter (Signed)
Called patient with Alis for interpreter and informed patient of results and asked patient if she had been using the over the counter medication. Patient states she just started it today. Told patient she can continue taking that and if she doesn't see improvement in a few days, she can pick up a prescription strength medication that we have sent in. Patient verbalized understanding and had no questions

## 2014-12-29 NOTE — Telephone Encounter (Signed)
-----   Message from Tereso NewcomerUgonna A Anyanwu, MD sent at 12/28/2014 12:46 PM EST ----- Wet prep showed yeast. Patient already using Monistat. If symptoms persist after Monistat, she can pick up prescribed Diflucan.  Speaks Spanish.  Please call to inform patient of results and recommendations.

## 2015-01-03 ENCOUNTER — Ambulatory Visit (INDEPENDENT_AMBULATORY_CARE_PROVIDER_SITE_OTHER): Payer: Self-pay | Admitting: Family Medicine

## 2015-01-03 VITALS — BP 129/70 | HR 91 | Wt 188.6 lb

## 2015-01-03 DIAGNOSIS — O0993 Supervision of high risk pregnancy, unspecified, third trimester: Secondary | ICD-10-CM

## 2015-01-03 DIAGNOSIS — O3421 Maternal care for scar from previous cesarean delivery: Secondary | ICD-10-CM

## 2015-01-03 DIAGNOSIS — O10912 Unspecified pre-existing hypertension complicating pregnancy, second trimester: Secondary | ICD-10-CM

## 2015-01-03 DIAGNOSIS — O34219 Maternal care for unspecified type scar from previous cesarean delivery: Secondary | ICD-10-CM

## 2015-01-03 LAB — POCT URINALYSIS DIP (DEVICE)
Bilirubin Urine: NEGATIVE
Glucose, UA: NEGATIVE mg/dL
Ketones, ur: NEGATIVE mg/dL
Nitrite: NEGATIVE
Protein, ur: NEGATIVE mg/dL
Specific Gravity, Urine: 1.015 (ref 1.005–1.030)
Urobilinogen, UA: 0.2 mg/dL (ref 0.0–1.0)
pH: 7.5 (ref 5.0–8.0)

## 2015-01-03 NOTE — Progress Notes (Signed)
Jennifer NormanBlanca Linder used as interpreter for this encounter.  C/o of mild pelvic pressure.  C/o of continuous white discharge with vaginal itching-- wet prep completed at last visit-- mod yeast seen-- Diflican prescribed. Patient stated she had tried monistat for 7 days without relief-- advised she go to pharmacy and pick up Diflucan-- discussed medication and how to take. Verbalized understanding.

## 2015-01-03 NOTE — Patient Instructions (Signed)
Third Trimester of Pregnancy The third trimester is from week 29 through week 42, months 7 through 9. This trimester is when your unborn baby (fetus) is growing very fast. At the end of the ninth month, the unborn baby is about 20 inches in length. It weighs about 6-10 pounds.  HOME CARE   Avoid all smoking, herbs, and alcohol. Avoid drugs not approved by your doctor.  Only take medicine as told by your doctor. Some medicines are safe and some are not during pregnancy.  Exercise only as told by your doctor. Stop exercising if you start having cramps.  Eat regular, healthy meals.  Wear a good support bra if your breasts are tender.  Do not use hot tubs, steam rooms, or saunas.  Wear your seat belt when driving.  Avoid raw meat, uncooked cheese, and liter boxes and soil used by cats.  Take your prenatal vitamins.  Try taking medicine that helps you poop (stool softener) as needed, and if your doctor approves. Eat more fiber by eating fresh fruit, vegetables, and whole grains. Drink enough fluids to keep your pee (urine) clear or pale yellow.  Take warm water baths (sitz baths) to soothe pain or discomfort caused by hemorrhoids. Use hemorrhoid cream if your doctor approves.  If you have puffy, bulging veins (varicose veins), wear support hose. Raise (elevate) your feet for 15 minutes, 3-4 times a day. Limit salt in your diet.  Avoid heavy lifting, wear low heels, and sit up straight.  Rest with your legs raised if you have leg cramps or low back pain.  Visit your dentist if you have not gone during your pregnancy. Use a soft toothbrush to brush your teeth. Be gentle when you floss.  You can have sex (intercourse) unless your doctor tells you not to.  Do not travel far distances unless you must. Only do so with your doctor's approval.  Take prenatal classes.  Practice driving to the hospital.  Pack your hospital bag.  Prepare the baby's room.  Go to your doctor visits. GET  HELP IF:  You are not sure if you are in labor or if your water has broken.  You are dizzy.  You have mild cramps or pressure in your lower belly (abdominal).  You have a nagging pain in your belly area.  You continue to feel sick to your stomach (nauseous), throw up (vomit), or have watery poop (diarrhea).  You have bad smelling fluid coming from your vagina.  You have pain with peeing (urination). GET HELP RIGHT AWAY IF:   You have a fever.  You are leaking fluid from your vagina.  You are spotting or bleeding from your vagina.  You have severe belly cramping or pain.  You lose or gain weight rapidly.  You have trouble catching your breath and have chest pain.  You notice sudden or extreme puffiness (swelling) of your face, hands, ankles, feet, or legs.  You have not felt the baby move in over an hour.  You have severe headaches that do not go away with medicine.  You have vision changes. Document Released: 02/20/2010 Document Revised: 03/23/2013 Document Reviewed: 01/27/2013 ExitCare Patient Information 2015 ExitCare, LLC. This information is not intended to replace advice given to you by your health care provider. Make sure you discuss any questions you have with your health care provider.  

## 2015-01-03 NOTE — Progress Notes (Signed)
A few contractions.  Good fetal movement. NST reactive.  BP controlled. Still thinking about TOLAC.  Will probably sign consent form later when it's closer to delivery.

## 2015-01-06 ENCOUNTER — Ambulatory Visit (INDEPENDENT_AMBULATORY_CARE_PROVIDER_SITE_OTHER): Payer: Self-pay | Admitting: *Deleted

## 2015-01-06 VITALS — BP 137/78 | HR 89

## 2015-01-06 DIAGNOSIS — O10913 Unspecified pre-existing hypertension complicating pregnancy, third trimester: Secondary | ICD-10-CM

## 2015-01-06 LAB — US OB FOLLOW UP

## 2015-01-06 NOTE — Progress Notes (Signed)
NST performed today was reviewed and was found to be reactive.  AFI normal at 16.6 cm.  Continue recommended antenatal testing and prenatal care. 

## 2015-01-06 NOTE — Progress Notes (Signed)
Interpreter - Alis Herrera present for encounter 

## 2015-01-10 ENCOUNTER — Other Ambulatory Visit: Payer: Self-pay

## 2015-01-11 ENCOUNTER — Ambulatory Visit (INDEPENDENT_AMBULATORY_CARE_PROVIDER_SITE_OTHER): Payer: Self-pay | Admitting: Advanced Practice Midwife

## 2015-01-11 ENCOUNTER — Encounter: Payer: Self-pay | Admitting: Advanced Practice Midwife

## 2015-01-11 VITALS — BP 129/70 | HR 89 | Temp 97.8°F | Wt 192.5 lb

## 2015-01-11 DIAGNOSIS — O26893 Other specified pregnancy related conditions, third trimester: Secondary | ICD-10-CM

## 2015-01-11 DIAGNOSIS — R12 Heartburn: Secondary | ICD-10-CM

## 2015-01-11 DIAGNOSIS — O10913 Unspecified pre-existing hypertension complicating pregnancy, third trimester: Secondary | ICD-10-CM

## 2015-01-11 LAB — POCT URINALYSIS DIP (DEVICE)
Bilirubin Urine: NEGATIVE
Glucose, UA: NEGATIVE mg/dL
Hgb urine dipstick: NEGATIVE
Ketones, ur: NEGATIVE mg/dL
Leukocytes, UA: NEGATIVE
Nitrite: NEGATIVE
Protein, ur: NEGATIVE mg/dL
Specific Gravity, Urine: 1.015 (ref 1.005–1.030)
Urobilinogen, UA: 0.2 mg/dL (ref 0.0–1.0)
pH: 7 (ref 5.0–8.0)

## 2015-01-11 MED ORDER — OMEPRAZOLE 40 MG PO CPDR: 40.0000 mg | DELAYED_RELEASE_CAPSULE | Freq: Every day | ORAL | Status: DC

## 2015-01-11 NOTE — Progress Notes (Signed)
Routine OB, NST.  Pt reports frequent UC's last night. She also has frequent acid reflux.

## 2015-01-11 NOTE — Progress Notes (Signed)
Doing well.  Good fetal movement, denies vaginal bleeding, LOF, regular contractions. Desires TOLAC.  Had bad experiences with previous C/S, received blood products, had complications with anesthesia.  Reviewed risks/benefits of TOLAC with hospital interpreter.  Pt read consent form in Spanish prior to visit.  VBAC consent signed.  Pt reports cramping/contractions daily, especially when she is at work.  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  Cervix 1/50 today.  Letter to be out of work a few days to rest.  If contractions continue, she should return to MAU, PTL precautions given.

## 2015-01-12 ENCOUNTER — Telehealth: Payer: Self-pay | Admitting: *Deleted

## 2015-01-12 NOTE — Telephone Encounter (Addendum)
Patient left a message on voicemail in Spanish 01/11/15 afternoon that she needs to speak to a nurse because she is not breathing right.

## 2015-01-12 NOTE — Telephone Encounter (Signed)
Called patient with interpreter Alviris Almonte. She reports that she has been having some problems with breathing-shortness of breath since Sunday. States she talked with provider 01/11/15 but after that it got worse and was short of breath and fatigued that worsed with movement.  States today is ok and denies any shortness of breath or problems. I advised her if her shortness of breath comes back to come for evaluation to MAU. Reviewed with her next appointment tomorrow for NST. She voices understanding.

## 2015-01-13 ENCOUNTER — Encounter: Payer: Self-pay | Admitting: Obstetrics & Gynecology

## 2015-01-13 ENCOUNTER — Ambulatory Visit (INDEPENDENT_AMBULATORY_CARE_PROVIDER_SITE_OTHER): Payer: Self-pay | Admitting: Obstetrics & Gynecology

## 2015-01-13 ENCOUNTER — Other Ambulatory Visit: Payer: Self-pay

## 2015-01-13 VITALS — BP 127/81 | HR 83

## 2015-01-13 DIAGNOSIS — O10913 Unspecified pre-existing hypertension complicating pregnancy, third trimester: Secondary | ICD-10-CM

## 2015-01-13 LAB — US OB FOLLOW UP

## 2015-01-13 NOTE — Progress Notes (Signed)
NST/AFI Luther RedoAdriana Zavala-Martinez present as Research officer, trade unionpanish Interpreter for The Timken CompanyEncounter.  Pt complains of SOB and contractions.  States she is to be reevaluated today by provider for decision if she should go to back to work or to be out due to contractions.  Pt states she has SOB even with rest.  No SOB noted, discussed with provider, precautions given to patient of when to go to MAU/

## 2015-01-13 NOTE — Progress Notes (Signed)
Patient is Spanish-speaking only, Spanish interpreter present for this encounter. NST performed today was reviewed and was found to be reactive.  AFI normal at 16 cm.  Continue recommended antenatal testing and prenatal care. Unchanged cervix this visit. She will talk to her boss to see if she can work reduced hours; work restrictions letter given to patient. No other complaints or concerns.  Preterm labor and fetal movement precautions reviewed.

## 2015-01-13 NOTE — Patient Instructions (Signed)
Regrese a la clinica cuando tenga su cita. Si tiene problemas o preguntas, llama a la clinica o vaya a la sala de [facility]encia al [facility].    

## 2015-01-14 ENCOUNTER — Encounter: Payer: Self-pay | Admitting: *Deleted

## 2015-01-17 ENCOUNTER — Ambulatory Visit (INDEPENDENT_AMBULATORY_CARE_PROVIDER_SITE_OTHER): Payer: Self-pay | Admitting: Obstetrics & Gynecology

## 2015-01-17 VITALS — BP 134/77 | HR 80 | Wt 192.0 lb

## 2015-01-17 DIAGNOSIS — O10913 Unspecified pre-existing hypertension complicating pregnancy, third trimester: Secondary | ICD-10-CM

## 2015-01-17 LAB — POCT URINALYSIS DIP (DEVICE)
Bilirubin Urine: NEGATIVE
Glucose, UA: NEGATIVE mg/dL
Hgb urine dipstick: NEGATIVE
Ketones, ur: NEGATIVE mg/dL
Nitrite: NEGATIVE
Protein, ur: NEGATIVE mg/dL
Specific Gravity, Urine: 1.015 (ref 1.005–1.030)
Urobilinogen, UA: 0.2 mg/dL (ref 0.0–1.0)
pH: 7 (ref 5.0–8.0)

## 2015-01-17 NOTE — Addendum Note (Signed)
Addended by: Jill SideAY, DIANE L on: 01/17/2015 04:38 PM   Modules accepted: Orders

## 2015-01-17 NOTE — Progress Notes (Signed)
Patient is Spanish-speaking only, Spanish interpreter present for this encounter. NST performed today was reviewed and was found to be reactive.  Continue recommended antenatal testing and prenatal care. No more contractions, has resumed normal work.  Preterm labor and fetal movement precautions reviewed.

## 2015-01-20 ENCOUNTER — Ambulatory Visit (INDEPENDENT_AMBULATORY_CARE_PROVIDER_SITE_OTHER): Payer: Self-pay | Admitting: *Deleted

## 2015-01-20 VITALS — BP 131/86 | HR 84

## 2015-01-20 DIAGNOSIS — O10913 Unspecified pre-existing hypertension complicating pregnancy, third trimester: Secondary | ICD-10-CM

## 2015-01-20 LAB — US OB FOLLOW UP

## 2015-01-20 NOTE — Progress Notes (Signed)
NST performed today was reviewed and was found to be reactive.  AFI normal at 14.8 cm.  Continue recommended antenatal testing and prenatal care.

## 2015-01-23 ENCOUNTER — Encounter (HOSPITAL_COMMUNITY): Payer: Self-pay | Admitting: *Deleted

## 2015-01-23 ENCOUNTER — Inpatient Hospital Stay (HOSPITAL_COMMUNITY)
Admission: AD | Admit: 2015-01-23 | Discharge: 2015-01-23 | Disposition: A | Discharge status: Home / Self Care | Payer: Self-pay | Source: Ambulatory Visit | Attending: Obstetrics and Gynecology | Admitting: Obstetrics and Gynecology

## 2015-01-23 DIAGNOSIS — O34219 Maternal care for unspecified type scar from previous cesarean delivery: Secondary | ICD-10-CM

## 2015-01-23 DIAGNOSIS — O26853 Spotting complicating pregnancy, third trimester: Secondary | ICD-10-CM

## 2015-01-23 DIAGNOSIS — O10913 Unspecified pre-existing hypertension complicating pregnancy, third trimester: Secondary | ICD-10-CM

## 2015-01-23 DIAGNOSIS — R7989 Other specified abnormal findings of blood chemistry: Secondary | ICD-10-CM

## 2015-01-23 DIAGNOSIS — O0993 Supervision of high risk pregnancy, unspecified, third trimester: Secondary | ICD-10-CM

## 2015-01-23 DIAGNOSIS — R109 Unspecified abdominal pain: Secondary | ICD-10-CM | POA: Insufficient documentation

## 2015-01-23 DIAGNOSIS — O9989 Other specified diseases and conditions complicating pregnancy, childbirth and the puerperium: Secondary | ICD-10-CM | POA: Insufficient documentation

## 2015-01-23 DIAGNOSIS — Z3A35 35 weeks gestation of pregnancy: Secondary | ICD-10-CM | POA: Insufficient documentation

## 2015-01-23 DIAGNOSIS — O4703 False labor before 37 completed weeks of gestation, third trimester: Secondary | ICD-10-CM

## 2015-01-23 DIAGNOSIS — R945 Abnormal results of liver function studies: Secondary | ICD-10-CM

## 2015-01-23 LAB — CBC
HCT: 30.7 % — ABNORMAL LOW (ref 36.0–46.0)
Hemoglobin: 10.5 g/dL — ABNORMAL LOW (ref 12.0–15.0)
MCH: 28.5 pg (ref 26.0–34.0)
MCHC: 34.2 g/dL (ref 30.0–36.0)
MCV: 83.4 fL (ref 78.0–100.0)
Platelets: 360 10*3/uL (ref 150–400)
RBC: 3.68 MIL/uL — ABNORMAL LOW (ref 3.87–5.11)
RDW: 14.3 % (ref 11.5–15.5)
WBC: 8.2 10*3/uL (ref 4.0–10.5)

## 2015-01-23 LAB — COMPREHENSIVE METABOLIC PANEL
ALT: 27 U/L (ref 0–35)
AST: 46 U/L — ABNORMAL HIGH (ref 0–37)
Albumin: 3 g/dL — ABNORMAL LOW (ref 3.5–5.2)
Alkaline Phosphatase: 168 U/L — ABNORMAL HIGH (ref 39–117)
Anion gap: 7 (ref 5–15)
BUN: 5 mg/dL — ABNORMAL LOW (ref 6–23)
CO2: 21 mmol/L (ref 19–32)
Calcium: 8.4 mg/dL (ref 8.4–10.5)
Chloride: 109 mmol/L (ref 96–112)
Creatinine, Ser: 0.38 mg/dL — ABNORMAL LOW (ref 0.50–1.10)
GFR calc Af Amer: >90 mL/min (ref >90)
GFR calc non Af Amer: >90 mL/min (ref >90)
Glucose, Bld: 89 mg/dL (ref 70–99)
Potassium: 3.2 mmol/L — ABNORMAL LOW (ref 3.5–5.1)
Sodium: 137 mmol/L (ref 135–145)
Total Bilirubin: 0.4 mg/dL (ref 0.3–1.2)
Total Protein: 7 g/dL (ref 6.0–8.3)

## 2015-01-23 LAB — URINE MICROSCOPIC-ADD ON

## 2015-01-23 LAB — PROTEIN / CREATININE RATIO, URINE
Creatinine, Urine: 11 mg/dL
Total Protein, Urine: <6 mg/dL

## 2015-01-23 LAB — URINALYSIS, ROUTINE W REFLEX MICROSCOPIC
Bilirubin Urine: NEGATIVE
Glucose, UA: NEGATIVE mg/dL
Ketones, ur: NEGATIVE mg/dL
Leukocytes, UA: NEGATIVE
Nitrite: NEGATIVE
Protein, ur: NEGATIVE mg/dL
Specific Gravity, Urine: <1.005 — ABNORMAL LOW (ref 1.005–1.030)
Urobilinogen, UA: 0.2 mg/dL (ref 0.0–1.0)
pH: 6 (ref 5.0–8.0)

## 2015-01-23 NOTE — MAU Note (Signed)
Pt presents to MAU with complaints of contractions and small amount of leakage of fluid this am.

## 2015-01-23 NOTE — Discharge Instructions (Signed)
Parto vaginal (Vaginal Delivery) Durante el parto, el mdico la ayudar a dar a luz a su beb. En elparto vaginal, deber pujar para que el beb salga por la vagina. Sin embargo, antes de que pueda sacar al beb, es necesario que ocurran ciertas cosas. La abertura del tero (cuello del tero) tiene que ablandarse, hacerse ms delgado y abrirse (dilatar) hasta que llegue a 10 cm. Adems, el beb tiene que bajar desde el tero a la vagina. SIGNOS DE TRABAJO DE PARTO  El mdico tendr primero que asegurarse de que usted est en trabajo de parto. Algunos signos son:   Eliminar lo que se llama tapn mucoso antes del inicio del trabajo de parto. Este es una pequea cantidad de mucosidad teida con sangre.  Tener contracciones uterinas regulares y dolorosas.   El tiempo entre las contracciones debe acortarse  Las molestias y el dolor se harn ms intensos gradualmente.  El dolor de las contracciones empeora al caminar y no se alivia con el reposo.   El cuello del tero se hace mas delgado (se borra) y se dilata. ANTES DEL PARTO Una vez que se inicie el trabajo de parto y sea admitida en el hospital o sanatorio, el mdico podr hacer lo siguiente:   Realizar un examen fsico.  Controlar si hay complicaciones relacionadas con el trabajo de parto.  Verificar su presin arterial, temperatura y pulso y la frecuencia cardaca (signos vitales).   Determinar si se ha roto el saco amnitico y cundo ha ocurrido.  Realizar un examen vaginal (utilizando un guante estril y un lubricante) para determinar:  La posicin (presentacin) del beb. El beb se presenta con la cabeza primero (vertex) en el canal de parto (vagina), o estn los pies o las nalgas primero (de nalgas)?  El nivel (estacin) de la cabeza del beb dentro del canal de parto.  El borramiento y la dilatacin del cuello uterino  El monitor fetal electrnico generalmente se coloca sobre el abdomen al llegar. Se utiliza para  controlar las contracciones y la frecuencia cardaca del beb.  Cuando el monitor est en el abdomen (monitor fetal externo), slo toma la frecuencia y la duracin de las contracciones. No informa acerca de la intensidad de las contracciones.  Si el mdico necesita saber exactamente la intensidad de las contracciones o cul es la frecuencia cardaca del beb, colocar un monitor interno en la vagina y el tero. El mdico comentar los riesgos y los beneficios de usar un monitor interno y le pedir autorizacin antes de colocar el dispositivo.  El monitoreo fetal continuo ser necesario si le han aplicado una epidural, si le administran ciertos medicamentos (como oxitocina) y si tiene complicaciones del embarazo o del trabajo de parto.  Podrn colocarle una va intravenosa en una vena del brazo para suministrarle lquidos y medicamentos, si es necesario. TRES ETAPAS DEL TRABAJO DE PARTO Y EL PARTO El trabajo de parto y el parto normales se dividen en tres etapas. Primera etapa Esta etapa comienza cuando comienzan las contracciones regulares y el cuello comienza a borrarse y dilatarse. Finaliza cuando el cuello est completamente abierto (completamente dilatado). La primera etapa es la etapa ms larga del trabajo de parto y puede durar desde 3 horas a 15 horas.  Algunos mtodos estn disponibles para ayudar con el dolor del parto. Usted y su mdico decidirn qu opcin es la mejor para usted. Las opciones incluyen:   Medicamentos narcticos. Estos son medicamentos fuertes que usted puede recibir a travs de una va intravenosa o   como inyeccin en el msculo. Estos medicamentos alivian el dolor pero no hacen que desaparezca completamente.  Epidural. Se administra un medicamento a travs de un tubo delgado que se inserta en la espalda. El medicamento adormece la parte inferior del cuerpo y evita el dolor en esa zona.  Bloqueo paracervical Es una inyeccin de un anestsico en cada lado del cuello  uterino.  Usted podr pedir un parto natural, que implica que no se usen analgsicos ni epidural durante el parto y el trabajo de parto. En cambio, podr tener otro tipo de ayuda como ejercicios respiratorios para hacer frente al dolor. Segunda etapa La segunda etapa del trabajo de parto comienza cuando el cuello se ha dilatado completamente a 10 cm. Contina hasta que usted puja al beb hacia abajo, por el canal de parto, y el beb nace. Esta etapa puede durar slo algunos minutos o algunas horas.  La posicin del la cabeza del beb a medida que pasa por el canal de parto, es informada como un nmero, llamado estacin. Si la cabeza del beb no ha iniciado su descenso, la estacin se describe como que est en menos 3 (-3). Cuando la cabeza del beb est en la estacin cero, est en el medio del canal de parto y se encaja en la pelvis. La estacin en la que se encuentra el beb indica el progreso de la segunda etapa del trabajo de parto.  Cuando el beb nace, el mdico lo sostendr con la cabeza hacia abajo para evitar que el lquido amnitico, el moco y la sangre entren en los pulmones del beb. La boca y la nariz del beb podrn ser succionadas con un pequeo bulbo para retirar todo lquido adicional.  El mdico podr colocar al beb sobre su estmago. Es importante evitar que el beb tome fro. Para hacerlo, el mdico secar al beb, lo colocar directamente sobre su piel, (sin mantas entre usted y el beb) y lo cubrir con mantas secas y tibias.  Se corta el cordn umbilical. Tercera etapa Durante la tercera etapa del trabajo de parto, el mdico sacar la placenta (alumbramiento) y se asegurar de que el sangrado est controlado. La salida de la placenta generalmente demora 5 minutos pero puede tardar hasta 30 minutos. Luego de la salida de la placenta, le darn un medicamento por va intravenosa o inyectable para ayudar a contraer el tero y controlar el sangrado. Si planea amamantar al beb,  puede intentar en este momento Luego de la salida de la placenta, el tero debe contraerse y quedar muy firme. Si el tero no queda firme, el mdico lo masajear. Esto es importante debido a que la contraccin del tero ayuda a cortar el sangrado en el sitio en que la placenta estaba unida al tero. Si el tero no se contrae adecuadamente ni permanece firme, podr causar un sangrado abundante. Si hay mucho sangrado, podrn darle medicamentos para contraer el tero y detener el sangrado.  Document Released: 11/08/2008 Document Revised: 04/12/2014 ExitCare Patient Information 2015 ExitCare, [employer]. This information is not intended to replace advice given to you by your health care provider. Make sure you discuss any questions you have with your health care provider.  

## 2015-01-23 NOTE — MAU Provider Note (Signed)
History     CSN: [id number]  Arrival date and time: 01/23/15 1647   First Provider Initiated Contact with Patient 01/23/15 1917      Chief Complaint  Patient presents with  . Rupture of Membranes  . Contractions   HPI  Patient is 29 y.o. G2P1001 6872w0d here with complaints of cramping and some spotting.  +FM, denies LOF, VB, vaginal discharge.  Past Medical History  Diagnosis Date  . Hypertension   . GERD (gastroesophageal reflux disease)   . PONV (postoperative nausea and vomiting)   . Blood transfusion without reported diagnosis   . Headache     Past Surgical History  Procedure Laterality Date  . Cesarean section      Family History  Problem Relation Age of Onset  . Hypertension Mother   . Heart disease Mother   . Hypertension Brother     History  Substance Use Topics  . Smoking status: Never Smoker   . Smokeless tobacco: Never Used  . Alcohol Use: No    Allergies:  Allergies  Allergen Reactions  . Lactose Intolerance (Gi) Other (See Comments)    Reaction:  GI upset    Prescriptions prior to admission  Medication Sig Dispense Refill Last Dose  . aspirin EC 81 MG tablet Take 1 tablet (81 mg total) by mouth daily. For prevention of preeclampsia (Patient taking differently: Take 81 mg by mouth daily. ) 200 tablet 0 01/23/2015 at Unknown time  . calcium carbonate (TUMS - DOSED IN MG ELEMENTAL CALCIUM) 500 MG chewable tablet Chew 1 tablet by mouth daily as needed for indigestion or heartburn.    01/22/2015 at Unknown time  . flintstones complete (FLINTSTONES) 60 MG chewable tablet Chew 2 tablets by mouth daily.   01/22/2015 at Unknown time  . methyldopa (ALDOMET) 500 MG tablet Take 1 tablet (500 mg total) by mouth 2 (two) times daily. 60 tablet 1 01/23/2015 at Unknown time  . omeprazole (PRILOSEC) 40 MG capsule Take 1 capsule (40 mg total) by mouth daily. 30 capsule 3 01/22/2015 at Unknown time  . promethazine (PHENERGAN) 25 MG tablet Take 1 tablet (25 mg total)  by mouth every 6 (six) hours as needed for nausea or vomiting. 30 tablet 0 Past Week at Unknown time  . butalbital-acetaminophen-caffeine (FIORICET) 50-325-40 MG per tablet Take 1-2 tablets by mouth every 6 (six) hours as needed for headache. (Patient not taking: Reported on 12/27/2014) 20 tablet 0 Not Taking    ROS Physical Exam   Blood pressure 138/95, pulse 82, temperature 97.5 F (36.4 C), resp. rate 18, last menstrual period 05/14/2014.  Physical Exam  Constitutional: She is oriented to person, place, and time. She appears well-developed and well-nourished.  HENT:  Head: Normocephalic and atraumatic.  Eyes: Conjunctivae and EOM are normal.  Neck: Normal range of motion.  Cardiovascular: Normal rate.   Respiratory: Effort normal. No respiratory distress.  GI: Soft. Bowel sounds are normal. She exhibits no distension. There is no tenderness.  Genitourinary:  No blood in vault, mucous plug appears dark  Musculoskeletal: Normal range of motion. She exhibits no edema.  Neurological: She is alert and oriented to person, place, and time.  Skin: Skin is warm and dry. No erythema.   2/7/0/-3 by my exam  MAU Course  Procedures  MDM NST reactive  Labs: Results for orders placed or performed during the hospital encounter of 01/23/15 (from the past 24 hour(s))  Urinalysis, Routine w reflex microscopic   Collection Time: 01/23/15  4:54 PM  Result Value Ref Range   Color, Urine YELLOW YELLOW   APPearance CLEAR CLEAR   Specific Gravity, Urine <1.005 (L) 1.005 - 1.030   pH 6.0 5.0 - 8.0   Glucose, UA NEGATIVE NEGATIVE mg/dL   Hgb urine dipstick MODERATE (A) NEGATIVE   Bilirubin Urine NEGATIVE NEGATIVE   Ketones, ur NEGATIVE NEGATIVE mg/dL   Protein, ur NEGATIVE NEGATIVE mg/dL   Urobilinogen, UA 0.2 0.0 - 1.0 mg/dL   Nitrite NEGATIVE NEGATIVE   Leukocytes, UA NEGATIVE NEGATIVE  Urine microscopic-add on   Collection Time: 01/23/15  4:54 PM  Result Value Ref Range   Squamous  Epithelial / LPF FEW (A) RARE   WBC, UA 0-2 <3 WBC/hpf   RBC / HPF 0-2 <3 RBC/hpf  Comprehensive metabolic panel   Collection Time: 01/23/15  5:29 PM  Result Value Ref Range   Sodium 137 135 - 145 mmol/L   Potassium 3.2 (L) 3.5 - 5.1 mmol/L   Chloride 109 96 - 112 mmol/L   CO2 21 19 - 32 mmol/L   Glucose, Bld 89 70 - 99 mg/dL   BUN 5 (L) 6 - 23 mg/dL   Creatinine, Ser 1.61 (L) 0.50 - 1.10 mg/dL   Calcium 8.4 8.4 - 09.6 mg/dL   Total Protein 7.0 6.0 - 8.3 g/dL   Albumin 3.0 (L) 3.5 - 5.2 g/dL   AST 46 (H) 0 - 37 U/L   ALT 27 0 - 35 U/L   Alkaline Phosphatase 168 (H) 39 - 117 U/L   Total Bilirubin 0.4 0.3 - 1.2 mg/dL   GFR calc non Af Amer >90 >90 mL/min   GFR calc Af Amer >90 >90 mL/min   Anion gap 7 5 - 15  CBC   Collection Time: 01/23/15  5:29 PM  Result Value Ref Range   WBC 8.2 4.0 - 10.5 K/uL   RBC 3.68 (L) 3.87 - 5.11 MIL/uL   Hemoglobin 10.5 (L) 12.0 - 15.0 g/dL   HCT 04.5 (L) 40.9 - 81.1 %   MCV 83.4 78.0 - 100.0 fL   MCH 28.5 26.0 - 34.0 pg   MCHC 34.2 30.0 - 36.0 g/dL   RDW 91.4 78.2 - 95.6 %   Platelets 360 150 - 400 K/uL    Imaging Studies:  No results found.    Assessment and Plan  Patient is 29 y.o. G2P1001 [redacted]w[redacted]d reporting cramping - fetal kick counts reinforced - preterm labor precautions   Jennifer Holmes 01/23/2015, 8:21 PM

## 2015-01-24 ENCOUNTER — Inpatient Hospital Stay (HOSPITAL_COMMUNITY)
Admission: AD | Admit: 2015-01-24 | Discharge: 2015-01-26 | DRG: 774 | Disposition: A | Discharge status: Home / Self Care | Payer: Medicaid Other | Source: Ambulatory Visit | Attending: Obstetrics and Gynecology | Admitting: Obstetrics and Gynecology

## 2015-01-24 ENCOUNTER — Encounter (HOSPITAL_COMMUNITY): Payer: Self-pay

## 2015-01-24 ENCOUNTER — Other Ambulatory Visit: Payer: Self-pay

## 2015-01-24 DIAGNOSIS — Z3A35 35 weeks gestation of pregnancy: Secondary | ICD-10-CM | POA: Diagnosis present

## 2015-01-24 DIAGNOSIS — O1092 Unspecified pre-existing hypertension complicating childbirth: Principal | ICD-10-CM | POA: Diagnosis present

## 2015-01-24 DIAGNOSIS — Z3483 Encounter for supervision of other normal pregnancy, third trimester: Secondary | ICD-10-CM | POA: Diagnosis present

## 2015-01-24 DIAGNOSIS — O133 Gestational [pregnancy-induced] hypertension without significant proteinuria, third trimester: Secondary | ICD-10-CM

## 2015-01-24 DIAGNOSIS — IMO0001 Reserved for inherently not codable concepts without codable children: Secondary | ICD-10-CM

## 2015-01-24 LAB — CBC
HCT: 32 % — ABNORMAL LOW (ref 36.0–46.0)
HCT: 26.5 % — ABNORMAL LOW (ref 36.0–46.0)
HCT: 22.7 % — ABNORMAL LOW (ref 36.0–46.0)
Hemoglobin: 11.1 g/dL — ABNORMAL LOW (ref 12.0–15.0)
Hemoglobin: 9.1 g/dL — ABNORMAL LOW (ref 12.0–15.0)
Hemoglobin: 7.8 g/dL — ABNORMAL LOW (ref 12.0–15.0)
MCH: 28.8 pg (ref 26.0–34.0)
MCH: 28.5 pg (ref 26.0–34.0)
MCH: 28.7 pg (ref 26.0–34.0)
MCHC: 34.7 g/dL (ref 30.0–36.0)
MCHC: 34.3 g/dL (ref 30.0–36.0)
MCHC: 34.4 g/dL (ref 30.0–36.0)
MCV: 82.9 fL (ref 78.0–100.0)
MCV: 83.1 fL (ref 78.0–100.0)
MCV: 83.5 fL (ref 78.0–100.0)
Platelets: 375 10*3/uL (ref 150–400)
Platelets: 327 10*3/uL (ref 150–400)
Platelets: 306 10*3/uL (ref 150–400)
RBC: 3.86 MIL/uL — ABNORMAL LOW (ref 3.87–5.11)
RBC: 3.19 MIL/uL — ABNORMAL LOW (ref 3.87–5.11)
RBC: 2.72 MIL/uL — ABNORMAL LOW (ref 3.87–5.11)
RDW: 14.4 % (ref 11.5–15.5)
RDW: 14.3 % (ref 11.5–15.5)
RDW: 14.7 % (ref 11.5–15.5)
WBC: 11.3 10*3/uL — ABNORMAL HIGH (ref 4.0–10.5)
WBC: 14.1 10*3/uL — ABNORMAL HIGH (ref 4.0–10.5)
WBC: 9.1 10*3/uL (ref 4.0–10.5)

## 2015-01-24 LAB — ABO/RH: ABO/RH(D): O POS

## 2015-01-24 LAB — MRSA PCR SCREENING: MRSA by PCR: NEGATIVE

## 2015-01-24 LAB — TYPE AND SCREEN
ABO/RH(D): O POS
Antibody Screen: NEGATIVE

## 2015-01-24 MED ORDER — OXYTOCIN BOLUS FROM INFUSION
500.0000 mL | INTRAVENOUS | Status: DC
  Administered 2015-01-24: 500 mL via INTRAVENOUS

## 2015-01-24 MED ORDER — AMPICILLIN SODIUM 2 G IJ SOLR
2.0000 g | Freq: Once | INTRAMUSCULAR | Status: AC
  Administered 2015-01-24: 2 g via INTRAVENOUS
  Filled 2015-01-24: qty 2000

## 2015-01-24 MED ORDER — PRENATAL MULTIVITAMIN CH
1.0000 | ORAL_TABLET | Freq: Every day | ORAL | Status: DC
  Administered 2015-01-24 – 2015-01-25 (×2): 1 via ORAL
  Filled 2015-01-24 (×3): qty 1

## 2015-01-24 MED ORDER — ONDANSETRON HCL 4 MG/2ML IJ SOLN
4.0000 mg | Freq: Once | INTRAMUSCULAR | Status: DC
Start: 2015-01-24 — End: 2015-01-24

## 2015-01-24 MED ORDER — FENTANYL CITRATE 0.05 MG/ML IJ SOLN
100.0000 ug | INTRAMUSCULAR | Status: DC | PRN
  Administered 2015-01-24 (×2): 100 ug via INTRAVENOUS
  Filled 2015-01-24 (×2): qty 2

## 2015-01-24 MED ORDER — OXYCODONE-ACETAMINOPHEN 5-325 MG PO TABS
1.0000 | ORAL_TABLET | ORAL | Status: DC | PRN
  Administered 2015-01-24: 1 via ORAL
  Filled 2015-01-24: qty 1

## 2015-01-24 MED ORDER — HYDRALAZINE HCL 20 MG/ML IJ SOLN
10.0000 mg | Freq: Once | INTRAMUSCULAR | Status: DC
  Filled 2015-01-24: qty 1

## 2015-01-24 MED ORDER — LACTATED RINGERS IV SOLN: 500.0000 mL | INTRAVENOUS | Status: DC | PRN

## 2015-01-24 MED ORDER — TETANUS-DIPHTH-ACELL PERTUSSIS 5-2.5-18.5 LF-MCG/0.5 IM SUSP
0.5000 mL | Freq: Once | INTRAMUSCULAR | Status: DC
Start: 2015-01-25 — End: 2015-01-26
  Filled 2015-01-24: qty 0.5

## 2015-01-24 MED ORDER — METHYLDOPA 500 MG PO TABS
500.0000 mg | ORAL_TABLET | Freq: Two times a day (BID) | ORAL | Status: DC
  Filled 2015-01-24 (×2): qty 1

## 2015-01-24 MED ORDER — MAGNESIUM SULFATE BOLUS VIA INFUSION
4.0000 g | Freq: Once | INTRAVENOUS | Status: DC
  Administered 2015-01-24: 4 g via INTRAVENOUS
  Filled 2015-01-24: qty 500

## 2015-01-24 MED ORDER — ONDANSETRON HCL 4 MG/2ML IJ SOLN
4.0000 mg | INTRAMUSCULAR | Status: DC | PRN
Start: 2015-01-24 — End: 2015-01-26
  Administered 2015-01-26: 4 mg via INTRAVENOUS
  Filled 2015-01-24: qty 2

## 2015-01-24 MED ORDER — MAGNESIUM HYDROXIDE 400 MG/5ML PO SUSP
30.0000 mL | ORAL | Status: DC | PRN
  Filled 2015-01-24: qty 30

## 2015-01-24 MED ORDER — LACTATED RINGERS IV BOLUS (SEPSIS): 1000.0000 mL | Freq: Once | INTRAVENOUS | Status: DC

## 2015-01-24 MED ORDER — SIMETHICONE 80 MG PO CHEW: 80.0000 mg | CHEWABLE_TABLET | ORAL | Status: DC | PRN

## 2015-01-24 MED ORDER — ONDANSETRON HCL 4 MG/2ML IJ SOLN: 4.0000 mg | Freq: Four times a day (QID) | INTRAMUSCULAR | Status: DC | PRN

## 2015-01-24 MED ORDER — BENZOCAINE-MENTHOL 20-0.5 % EX AERO
1.0000 "application " | INHALATION_SPRAY | CUTANEOUS | Status: DC | PRN
  Administered 2015-01-24: 1 via TOPICAL
  Filled 2015-01-24: qty 56

## 2015-01-24 MED ORDER — CITRIC ACID-SODIUM CITRATE 334-500 MG/5ML PO SOLN
30.0000 mL | ORAL | Status: DC | PRN
  Filled 2015-01-24: qty 30

## 2015-01-24 MED ORDER — IBUPROFEN 600 MG PO TABS
600.0000 mg | ORAL_TABLET | Freq: Four times a day (QID) | ORAL | Status: DC
  Administered 2015-01-24 – 2015-01-26 (×10): 600 mg via ORAL
  Filled 2015-01-24 (×11): qty 1

## 2015-01-24 MED ORDER — LACTATED RINGERS IV SOLN
INTRAVENOUS | Status: DC
  Administered 2015-01-24: 01:00:00 via INTRAVENOUS

## 2015-01-24 MED ORDER — OXYCODONE-ACETAMINOPHEN 5-325 MG PO TABS: 1.0000 | ORAL_TABLET | ORAL | Status: DC | PRN

## 2015-01-24 MED ORDER — SENNOSIDES-DOCUSATE SODIUM 8.6-50 MG PO TABS
2.0000 | ORAL_TABLET | ORAL | Status: DC
  Administered 2015-01-25 (×2): 2 via ORAL
  Filled 2015-01-24 (×2): qty 2

## 2015-01-24 MED ORDER — METHYLDOPA 250 MG PO TABS
250.0000 mg | ORAL_TABLET | Freq: Two times a day (BID) | ORAL | Status: DC
  Filled 2015-01-24: qty 1

## 2015-01-24 MED ORDER — FLEET ENEMA 7-19 GM/118ML RE ENEM: 1.0000 | ENEMA | RECTAL | Status: DC | PRN

## 2015-01-24 MED ORDER — WITCH HAZEL-GLYCERIN EX PADS: 1.0000 "application " | MEDICATED_PAD | CUTANEOUS | Status: DC | PRN

## 2015-01-24 MED ORDER — LANOLIN HYDROUS EX OINT: 1.0000 "application " | TOPICAL_OINTMENT | CUTANEOUS | Status: DC | PRN

## 2015-01-24 MED ORDER — DIPHENHYDRAMINE HCL 25 MG PO CAPS: 25.0000 mg | ORAL_CAPSULE | Freq: Four times a day (QID) | ORAL | Status: DC | PRN

## 2015-01-24 MED ORDER — ZOLPIDEM TARTRATE 5 MG PO TABS: 5.0000 mg | ORAL_TABLET | Freq: Every evening | ORAL | Status: DC | PRN

## 2015-01-24 MED ORDER — OXYCODONE-ACETAMINOPHEN 5-325 MG PO TABS: 2.0000 | ORAL_TABLET | ORAL | Status: DC | PRN

## 2015-01-24 MED ORDER — MEASLES, MUMPS & RUBELLA VAC ~~LOC~~ INJ
0.5000 mL | INJECTION | Freq: Once | SUBCUTANEOUS | Status: DC
Start: 2015-01-25 — End: 2015-01-26
  Filled 2015-01-24: qty 0.5

## 2015-01-24 MED ORDER — MAGNESIUM SULFATE 40 G IN LACTATED RINGERS - SIMPLE
1.0000 g/h | INTRAVENOUS | Status: DC
  Administered 2015-01-24: 2 g/h via INTRAVENOUS
  Filled 2015-01-24: qty 500

## 2015-01-24 MED ORDER — DIBUCAINE 1 % RE OINT: 1.0000 "application " | TOPICAL_OINTMENT | RECTAL | Status: DC | PRN

## 2015-01-24 MED ORDER — LIDOCAINE HCL (PF) 1 % IJ SOLN
30.0000 mL | INTRAMUSCULAR | Status: AC | PRN
  Administered 2015-01-24: 30 mL via SUBCUTANEOUS
  Filled 2015-01-24: qty 30

## 2015-01-24 MED ORDER — FERROUS SULFATE 325 (65 FE) MG PO TABS
325.0000 mg | ORAL_TABLET | Freq: Two times a day (BID) | ORAL | Status: DC
  Administered 2015-01-24 – 2015-01-26 (×4): 325 mg via ORAL
  Filled 2015-01-24 (×5): qty 1

## 2015-01-24 MED ORDER — OXYTOCIN 40 UNITS IN LACTATED RINGERS INFUSION - SIMPLE MED
62.5000 mL/h | INTRAVENOUS | Status: DC
  Filled 2015-01-24: qty 1000

## 2015-01-24 MED ORDER — OXYCODONE-ACETAMINOPHEN 5-325 MG PO TABS
2.0000 | ORAL_TABLET | ORAL | Status: DC | PRN
  Administered 2015-01-24: 2 via ORAL
  Filled 2015-01-24: qty 2

## 2015-01-24 MED ORDER — ONDANSETRON HCL 4 MG PO TABS
4.0000 mg | ORAL_TABLET | ORAL | Status: DC | PRN
  Administered 2015-01-24: 4 mg via ORAL
  Filled 2015-01-24: qty 1

## 2015-01-24 MED ORDER — LACTATED RINGERS IV SOLN
INTRAVENOUS | Status: DC
  Administered 2015-01-24: 17:00:00 via INTRAVENOUS

## 2015-01-24 MED ORDER — ACETAMINOPHEN 325 MG PO TABS: 650.0000 mg | ORAL_TABLET | ORAL | Status: DC | PRN

## 2015-01-24 NOTE — Progress Notes (Signed)
I ordered patient's lunch.  Jennifer Holmes  Interpreter. °

## 2015-01-24 NOTE — Progress Notes (Signed)
I assisted Carol,RN with paper work. Eda H Royal  Interpreter.

## 2015-01-24 NOTE — Progress Notes (Signed)
I assisted International aid/development workerLisa RN with some questions I ordered her dinner and breakfast, by Orlan LeavensViria Alvarez Spanish Interpreter

## 2015-01-24 NOTE — Progress Notes (Signed)
UR chart review completed.  

## 2015-01-24 NOTE — H&P (Signed)
HPI: Jennifer Holmes is a 29 y.o. year old G12P1001 female at [redacted]w[redacted]d weeks gestation by 8 week [place] who presents to MAU reporting preterm labor. Upon arrival to MAU triage room pt reported SOB and started dry-heaving. No Hx similar Sx. Pt very anxious. In great pain. Hyperventilating. O2 sats 100% on RA. After dry-heaving stopped and pt calmed down her SOB resolved.   Started care at [facility] and transferred to [facility] due to [facility], on [place]. Hx C/S for possible abruption 7 years ago. TOLAC consent signed, under Media tab.   Clinic [facility]  Dating  12 wk ultrasound  Genetic Screen 1 Screen:  Normal NT   AFP nml  Anatomic [place]  incomplete- needs follow up at 32 weeks > scheduled  GTT Early:               Third trimester:   TDaP vaccine 12/06/14  Flu vaccine   GBS Unknown  Contraception Wants a tubal- applying for medicaid  [employer] breast  Circumcision female  Pediatrician  [facility] - 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 Active labor 01/24/2015  . Hypertension in pregnancy, antepartum   . Elevated LFTs 09/12/2014  . History of fatty infiltration of liver 09/12/2014  . Preexisting hypertension complicating pregnancy, antepartum 08/05/2014  . Supervision of high-risk pregnancy 08/05/2014  . Previous term cesarean section complicating pregnancy, antepartum 08/05/2014   Maternal Medical History:  Reason for admission: Nausea.     OB History    Gravida Para Term Preterm AB TAB SAB Ectopic Multiple Living   0 0 0 0 0 0 1      Obstetric Comments   Had a blood transfusion with first pregnancy after her C/S in [place].     Past Medical History  Diagnosis Date  . Hypertension   . GERD (gastroesophageal reflux disease)   . PONV (postoperative nausea and vomiting)   . Blood transfusion without reported diagnosis   . Headache    Past Surgical History  Procedure Laterality Date   . Cesarean section     Family History: family history includes Heart disease in her mother; Hypertension in her brother and mother. Social History:  reports that she has never smoked. She has never used smokeless tobacco. She reports that she does not drink alcohol or use illicit drugs.  Medications Prior to Admission  Medication Sig Dispense Refill  . aspirin EC 81 MG tablet Take 1 tablet (81 mg total) by mouth daily. For prevention of preeclampsia (Patient taking differently: Take 81 mg by mouth daily. ) 200 tablet 0  . butalbital-acetaminophen-caffeine (FIORICET) 50-325-40 MG per tablet Take 1-2 tablets by mouth every 6 (six) hours as needed for headache. (Patient not taking: Reported on 12/27/2014) 20 tablet 0  . calcium carbonate (TUMS - DOSED IN MG ELEMENTAL CALCIUM) 500 MG chewable tablet Chew 1 tablet by mouth daily as needed for indigestion or heartburn.     . flintstones complete (FLINTSTONES) 60 MG chewable tablet Chew 2 tablets by mouth daily.    . methyldopa (ALDOMET) 500 MG tablet Take 1 tablet (500 mg total) by mouth 2 (two) times daily. 60 tablet 1  . omeprazole (PRILOSEC) 40 MG capsule Take 1 capsule (40 mg total) by mouth daily. 30 capsule 3  . promethazine (PHENERGAN) 25 MG tablet Take 1 tablet (25 mg total) by mouth  every 6 (six) hours as needed for nausea or vomiting. 30 tablet 0    Prenatal Transfer Tool  Maternal Diabetes: No Genetic Screening: Normal Maternal Ultrasounds/Referrals: Normal Fetal Ultrasounds or other Referrals:  None Maternal Substance Abuse:  No Significant Maternal Medications:  Meds include: Other:  Significant Maternal Lab Results:  Lab values include: Other:  Other Comments:  Unknown GBS due to prematurity. Ampiciliin x 1 in labor. Inadequate prophylaxis. Prilosec, Fioricet, [place] in pregnancy. CHTN.   Review of Systems  Constitutional: Negative for fever and chills.  Eyes: Positive for blurred vision (seeing flashing lights intermittently  since 01/22/15).  Respiratory: Positive for shortness of breath. Negative for hemoptysis.   Cardiovascular: Negative for chest pain and leg swelling.  Gastrointestinal: Positive for nausea, vomiting and abdominal pain.  Neurological: Positive for headaches (since 01/22/15). Negative for seizures.   BP 118/60 mmHg  Pulse 107  Temp(Src) 98.4 F (36.9 C) (Oral)  Resp 18  Ht 5' (1.524 m)  Wt 87.091 kg (192 lb)  BMI 37.50 kg/m2  SpO2 99%  LMP 05/14/2014  01/24/15 0122 (!) 156/90 mmHg - - 81 - - - -  01/24/15 0119 - - - 90 - 96 % - -  01/24/15 0100 (!) 168/103 mmHg - - 91 - - 5' (1.524 m) 87.091 kg (192 lb)  01/24/15 0043 - - - - 18 - - -    Dilation: 8.5 Effacement (%): 100 Station: -1 Exam by:: [employer] Pacific CorporationVirginia Leora Platt, CNM Resp. rate 18, last menstrual period 05/14/2014. Maternal Exam:  Uterine Assessment: Contraction strength is firm.  Contraction frequency is regular.   Abdomen: Patient reports no abdominal tenderness. Surgical scars: low transverse.   Estimated fetal weight is 6 lb.   Fetal presentation: vertex  Introitus: Normal vulva. Vulva is negative for lesion.  Pelvis: adequate for delivery.   Cervix: Cervix evaluated by digital exam.     Fetal Exam Fetal Monitor Review: Mode: ultrasound.   Baseline rate: 150.  Variability: moderate (6-25 bpm).   Pattern: accelerations present and no decelerations.    Fetal State Assessment: Category I - tracings are normal.     Physical Exam  Nursing note and vitals reviewed. Constitutional: She is oriented to person, place, and time. She appears well-developed and well-nourished. She appears distressed.  HENT:  Head: Normocephalic.  Eyes: Conjunctivae are normal.  Cardiovascular: Normal rate, regular rhythm and normal heart sounds.   Respiratory: Effort normal and breath sounds normal. Tachypnea (initially, resolved) noted. No respiratory distress. She has no decreased breath sounds. She has no wheezes. She has no rhonchi. She  has no rales.  GI: Soft. There is no tenderness.  Genitourinary: Vagina normal and uterus normal. Vulva exhibits no lesion.  Musculoskeletal: Normal range of motion. She exhibits edema (1+). She exhibits no tenderness.  Neurological: She is alert and oriented to person, place, and time. She has normal reflexes.  Skin: Skin is warm and dry. She is not diaphoretic.  Psychiatric: She has a normal mood and affect.    Prenatal labs: ABO, Rh: O/Positive/-- (08/06 0000) Antibody: Negative (08/06 0000) Rubella: Immune (08/06 0000) RPR: NON REAC (12/28 1521)  HBsAg: NEGATIVE (11/19 0853)  HIV: NONREACTIVE (12/28 1521)  GBS:   Unknown  Assessment: 1. Labor: Preterm labor, transition  2. Fetal Wellbeing: Category I  3. Pain Control: Fentanyl 4. GBS: Unknown 5. 35.1 week IUP  Plan:  1. Admit to BS per consult with MD 2. Routine L&D orders 3. Analgesia/anesthesia PRN  4. Ampicillin 5. Mag Sulfate  postpartum unless severe-range BP persists 6. Hydralazine PRN   Dorathy Kinsman 01/24/2015, 1:07 AM    fer

## 2015-01-24 NOTE — Progress Notes (Signed)
Pt up to BR with assistance - still unable to void. 2nd I&O cath done - 300cc clear amber urine obtained.

## 2015-01-24 NOTE — Progress Notes (Signed)
Post Partum Day 1 Subjective: Pt has had low blood pressures while on Mag sulfate. Discussed at rounds, will d/c mag now. Pr/Cr is now very low.  Objective: Blood pressure 107/66, pulse 84, temperature 98.3 F (36.8 C), temperature source Oral, resp. rate 18, height 5' (1.524 m), weight 84.55 kg (186 lb 6.4 oz), last menstrual period 05/14/2014, SpO2 99 %, unknown if currently breastfeeding.  Physical Exam:  General: alert and no distress Lochia: appropriate Uterine Fundus: firm Incision:  DVT Evaluation: No evidence of DVT seen on physical exam. Reflexes 1+   Recent Labs  01/24/15 0100 01/24/15 0545  HGB 11.1* 9.1*  HCT 32.0* 26.5*    Assessment/Plan: Ppd 0 transient htn at delivery, resolved I don't believe we should be treating as preeclamptic after review of sequence of care and Pr/Cr (returned after del) P d/c mag. Resume routine care.  LOS: 0 days   Kynsli Haapala V 01/24/2015, 9:28 AM

## 2015-01-24 NOTE — Lactation Note (Addendum)
This note was copied from the chart of Jennifer Holmes. Lactation Consultation Note  Patient Name: Jennifer Volney Pressersucena Hingle WGNFA'OToday's Date: 01/24/2015 Reason for consult: Initial assessment LPI 8 hours of life. Parents declined interpreter. Mom is very sleepy, and cannot keep eyes open. Mom states that she nursed first child for 3 years without any issues. Discussed with FOB how to wash pump parts. Set DEBP up in room and is ready for use. Offered to assist mom to put baby to breast, mom refused. FOB fed baby 10mls of FO, and baby tolerated well. Reviewed with FOB LPI care guidelines and enc feeding every 3 hours, 10mls for first 24 hours and up to 20mls second 24 hours. Guidelines and LC brochure left in patient's room. Enc mom to allow LC to assist with pumping, mom declined. Enc mom to put baby to breast and post-pump for 15 minutes as soon as feeling up to it. Enc parents to call for assistance as needed.   Maternal Data Has patient been taught Hand Expression?: Yes Does the patient have breastfeeding experience prior to this delivery?: Yes  Feeding Feeding Type: Bottle Fed - Formula Nipple Type: Slow - flow Length of feed: 5 min  LATCH Score/Interventions                      Lactation Tools Discussed/Used Pump Review: Setup, frequency, and cleaning Initiated by:: JW Date initiated:: 01/24/15   Consult Status Consult Status: Follow-up Date: 01/25/15 Follow-up type: In-patient    Geralynn OchsWILLIARD, Tyquon Near 01/24/2015, 10:35 AM

## 2015-01-24 NOTE — Progress Notes (Signed)
Called MD to Room 118. Pt c/o chest pressure, SOB, left sh pain, N/V- sm amts emesis. Vitals WNL. Laid pt flat. MD at bedside. Pts complaints lessened. MD will order CBC and Orthostatics.

## 2015-01-24 NOTE — Progress Notes (Signed)
I stopped by to check on patient's needs.  Eda H Royal Interpreter. °

## 2015-01-24 NOTE — Progress Notes (Signed)
Pt had not voided since delivery & unable to void on bedpan (too dizzy to get OOB at this time). Bladder scan showed 349cc. I&O cath volume = 400cc

## 2015-01-25 LAB — RPR: RPR Ser Ql: NONREACTIVE

## 2015-01-25 NOTE — Lactation Note (Signed)
This note was copied from the chart of Jennifer Volney Pressersucena Siegfried. Lactation Consultation Note  Mother demonstrated hand expression and no drops expressed. Mother states she pumped 5x last night and receiving nothing, not even drops. Mother is giving formula and not breastfeeding. Encouraged her to do lots of STS, breastfeed  q3 then give formula supplementation and continue to pump every 3 hours except 1-2 feeding during the night. Suggest that with stimulation her milk supply should increase.    Patient Name: Jennifer Holmes WYOVZ'CToday's Date: 01/25/2015 Reason for consult: Follow-up assessment   Maternal Data    Feeding Feeding Type: Bottle Fed - Formula  LATCH Score/Interventions                      Lactation Tools Discussed/Used     Consult Status Consult Status: Follow-up Date: 01/26/15 Follow-up type: In-patient    Dahlia ByesBerkelhammer, Freddie Nghiem [facility] 01/25/2015, 2:53 PM

## 2015-01-25 NOTE — Progress Notes (Signed)
I assisted Dr. Erlinda HongMc Cormick with explanation of care plan for the Baby. Eda H Royal  Interpreter.

## 2015-01-25 NOTE — Progress Notes (Signed)
I stopped by patient's room to check on her needs, I ordered her dinner, snack and breakfast, by Viria Alvarez Spanish Interpreter °

## 2015-01-25 NOTE — Progress Notes (Signed)
Post Partum Day 1 Subjective:  Jennifer Holmes is a 29 y.o. G2P1102 7161w1d s/p NSVD.  Called to bedside overnight for dizziness, chest pressure and fatigue after taking a percocet. Obtained CBC which was stable. Symptoms resolved by the time I was at the bedside. This morning she feels well and is without complaints. Pt denies problems with ambulating, voiding or po intake.  She denies nausea or vomiting.  Pain is well controlled.  She has had flatus. She has not had bowel movement.  Lochia Moderate.  Plan for birth control is condoms.  Method of Feeding: breast and bottle  Objective: Blood pressure 132/76, pulse 76, temperature 97.8 F (36.6 C), temperature source Oral, resp. rate 20, height 5' (1.524 m), weight 186 lb 6.4 oz (84.55 kg), last menstrual period 05/14/2014, SpO2 99 %, unknown if currently breastfeeding.  Physical Exam:  General: alert, cooperative and no distress Lochia:normal flow Chest: normal effort Heart: normal rate Abdomen: +BS, soft, nontender Uterine Fundus: firm DVT Evaluation: No evidence of DVT seen on physical exam. Extremities: trace edema   Recent Labs  01/24/15 0545 01/24/15 2018  HGB 9.1* 7.8*  HCT 26.5* 22.7*    Assessment/Plan:  ASSESSMENT: Jennifer Holmes is a 29 y.o. [id number] 661w1d s/p NSVD  Plan for discharge tomorrow, Breastfeeding, Lactation consult and Contraception condoms   LOS: 1 day   William DaltonMcEachern, Lavena Loretto 01/25/2015, 7:57 AM

## 2015-01-25 NOTE — Progress Notes (Signed)
Called concerning chest pressure. Spanish interpreter used throughout history. States chest pressure is still present, although it is improved. Denies radiation. Denies diaphoresis. Denies shortness of breath. Pain worse with deep breaths. Similar chest pressure yesterday which resolved on its own. No history of cardiac complaints. Similar pain during pregnancy associated with elevated blood pressures. EKG shows normal sinus rhythm with HR 88. BP elevated to 142/85 at this time, but normotensive previously. Will continue to monitor pain and BP. Will not initiate anti-hypertensive at this time, but will consider if BP remains elevated.   Dr. Garry Heateraleigh Rumley, PGY-1 Family Medicine

## 2015-01-25 NOTE — Progress Notes (Signed)
I assisted  Jennifer Caulamley N Rumley, DO Resident with some questions, by Orlan LeavensViria Alvarez Spanish Interpreter

## 2015-01-25 NOTE — Progress Notes (Signed)
I ordered patient's lunch.  Jennifer Holmes  Interpreter. °

## 2015-01-26 MED ORDER — FERROUS SULFATE 325 (65 FE) MG PO TABS: 325.0000 mg | ORAL_TABLET | Freq: Two times a day (BID) | ORAL | Status: DC

## 2015-01-26 MED ORDER — IBUPROFEN 600 MG PO TABS: 600.0000 mg | ORAL_TABLET | Freq: Four times a day (QID) | ORAL | Status: DC

## 2015-01-26 NOTE — Discharge Instructions (Signed)

## 2015-01-26 NOTE — Progress Notes (Signed)
Eta the interpreter in to explain mother's DC teaching. All questions anwered. Infant will be  Staying overnight, infant teaching not explained yet.

## 2015-01-26 NOTE — Lactation Note (Signed)
This note was copied from the chart of Jennifer Holmes Dambrosia. Lactation Consultation Note  Patient Name: Jennifer Holmes Crist ZOXWR'UToday's Date: 01/26/2015   Visited with Mom on possible day of discharge, baby 7559 hrs old.  Phototherapy dc'd this am, and bilirubin to be checked later today.  Mom states her milk is in.  Breasts are heavy, but not engorged.  Talked about importance of double pumping regularly (8-12 times/24 hrs) if baby unable to latch and breast feed right now.  Assisted Mom to double pump, and 30-3340ml of milk expressed.  Recommended she offer that to baby rather than formula now.  Mom has WIC, so faxed a request for breast pump.  Will follow up with Mom at discharge to reinforce plan and schedule follow up as needed. To call prn.   Judee ClaraSmith, Oletha Tolson E 01/26/2015, 1:21 PM

## 2015-01-26 NOTE — Progress Notes (Signed)
I assisted Natalie,RN with Mom's discharge instructions. Eda H Royal  Interpreter.

## 2015-01-26 NOTE — Progress Notes (Signed)
Eta in to explain plan of care and update patient on infant's status. All questions answered.

## 2015-01-26 NOTE — Progress Notes (Signed)
I ordered patient's lunch and assisted Natalie,RN with explanation of care plan. Eda H Royal Interpreter.

## 2015-01-26 NOTE — Discharge Summary (Signed)
Obstetric Discharge Summary Reason for Admission: onset of labor Prenatal Procedures: none Intrapartum Procedures: spontaneous vaginal delivery and GBS prophylaxis Postpartum Procedures: none Complications-Operative and Postpartum: 2nd degree perineal laceration  Delivery Note At 1:54 AM a healthy female was delivered via VBAC, Spontaneous (Presentation: Left Occiput Anterior).  APGAR: 8, 9; weight 6 lb 15.6 oz (3165 g).   Placenta status: Intact, Spontaneous.  Cord: 3 vessels with the following complications: None.    Anesthesia: Local  Episiotomy: None Lacerations: 2nd degree Suture Repair: vicryl rapide Est. Blood Loss (mL): 500  Mom to postpartum.  Baby to Couplet care / Skin to Skin.  Hospital Course:  Active Problems:   Active labor   Jennifer Holmes is a 29 y.o. [id number] s/p SVD.  Patient presented with preterm labor and was admitted to L&D.  She has postpartum course that was uncomplicated including no problems with ambulating, PO intake, urination, pain, or bleeding. The pt feels ready to go home and  will be discharged with outpatient follow-up.   Today: No acute events overnight.  Pt denies problems with ambulating, voiding or po intake.  She denies nausea or vomiting.  Pain is well controlled.  She has had flatus. She has not had bowel movement.  Lochia Minimal.  Plan for birth control is  condoms.  Method of Feeding: Breast and Bottle   H/H: Lab Results  Component Value Date/Time   HGB 7.8* 01/24/2015 08:18 PM   HGB 13.4 07/15/2014   HCT 22.7* 01/24/2015 08:18 PM   HCT 40 07/15/2014    Discharge Diagnoses: Premature labor  Discharge Information: Date: 01/26/2015 Activity: pelvic rest Diet: routine  Medications: Ibuprofen and Iron Breast feeding:  Yes Condition: stable Instructions: refer to handout Discharge to: home      Medication List    ASK your doctor about these medications        aspirin EC 81 MG tablet  Take 1 tablet (81 mg  total) by mouth daily. For prevention of preeclampsia     butalbital-acetaminophen-caffeine 50-325-40 MG per tablet  Commonly known as:  FIORICET  Take 1-2 tablets by mouth every 6 (six) hours as needed for headache.     calcium carbonate 500 MG chewable tablet  Commonly known as:  TUMS - dosed in mg elemental calcium  Chew 1 tablet by mouth daily as needed for indigestion or heartburn.     flintstones complete 60 MG chewable tablet  Chew 2 tablets by mouth daily.     methyldopa 500 MG tablet  Commonly known as:  ALDOMET  Take 1 tablet (500 mg total) by mouth 2 (two) times daily.     omeprazole 40 MG capsule  Commonly known as:  PRILOSEC  Take 1 capsule (40 mg total) by mouth daily.     promethazine 25 MG tablet  Commonly known as:  PHENERGAN  Take 1 tablet (25 mg total) by mouth every 6 (six) hours as needed for nausea or vomiting.        Araceli Boucheumley, Oaks N, DO PGY-1 Family Medicine 01/26/2015,7:24 AM

## 2015-01-27 ENCOUNTER — Inpatient Hospital Stay (HOSPITAL_COMMUNITY)
Admission: AD | Admit: 2015-01-27 | Discharge: 2015-01-29 | DRG: 776 | Disposition: A | Discharge status: Home / Self Care | Payer: Medicaid Other | Source: Ambulatory Visit | Attending: Obstetrics & Gynecology | Admitting: Obstetrics & Gynecology

## 2015-01-27 ENCOUNTER — Encounter (HOSPITAL_COMMUNITY): Payer: Self-pay | Admitting: *Deleted

## 2015-01-27 ENCOUNTER — Ambulatory Visit: Payer: Self-pay

## 2015-01-27 ENCOUNTER — Other Ambulatory Visit: Payer: Self-pay

## 2015-01-27 DIAGNOSIS — R42 Dizziness and giddiness: Secondary | ICD-10-CM | POA: Diagnosis present

## 2015-01-27 DIAGNOSIS — Z8759 Personal history of other complications of pregnancy, childbirth and the puerperium: Secondary | ICD-10-CM | POA: Diagnosis present

## 2015-01-27 DIAGNOSIS — O9089 Other complications of the puerperium, not elsewhere classified: Principal | ICD-10-CM | POA: Diagnosis present

## 2015-01-27 DIAGNOSIS — O09299 Supervision of pregnancy with other poor reproductive or obstetric history, unspecified trimester: Secondary | ICD-10-CM | POA: Diagnosis present

## 2015-01-27 DIAGNOSIS — I158 Other secondary hypertension: Secondary | ICD-10-CM | POA: Diagnosis present

## 2015-01-27 LAB — COMPREHENSIVE METABOLIC PANEL
ALT: 254 U/L — ABNORMAL HIGH (ref 0–35)
AST: 629 U/L — ABNORMAL HIGH (ref 0–37)
Albumin: 3 g/dL — ABNORMAL LOW (ref 3.5–5.2)
Alkaline Phosphatase: 138 U/L — ABNORMAL HIGH (ref 39–117)
Anion gap: 4 — ABNORMAL LOW (ref 5–15)
BUN: 9 mg/dL (ref 6–23)
CO2: 23 mmol/L (ref 19–32)
Calcium: 7.9 mg/dL — ABNORMAL LOW (ref 8.4–10.5)
Chloride: 111 mmol/L (ref 96–112)
Creatinine, Ser: 0.37 mg/dL — ABNORMAL LOW (ref 0.50–1.10)
GFR calc Af Amer: >90 mL/min (ref >90)
GFR calc non Af Amer: >90 mL/min (ref >90)
Glucose, Bld: 88 mg/dL (ref 70–99)
Potassium: 3.3 mmol/L — ABNORMAL LOW (ref 3.5–5.1)
Sodium: 138 mmol/L (ref 135–145)
Total Bilirubin: 0.5 mg/dL (ref 0.3–1.2)
Total Protein: 6.3 g/dL (ref 6.0–8.3)

## 2015-01-27 LAB — CBC
HCT: 22.5 % — ABNORMAL LOW (ref 36.0–46.0)
Hemoglobin: 7.3 g/dL — ABNORMAL LOW (ref 12.0–15.0)
MCH: 28.3 pg (ref 26.0–34.0)
MCHC: 32.4 g/dL (ref 30.0–36.0)
MCV: 87.2 fL (ref 78.0–100.0)
Platelets: 336 10*3/uL (ref 150–400)
RBC: 2.58 MIL/uL — ABNORMAL LOW (ref 3.87–5.11)
RDW: 15.2 % (ref 11.5–15.5)
WBC: 5.6 10*3/uL (ref 4.0–10.5)

## 2015-01-27 LAB — PROTEIN / CREATININE RATIO, URINE
Creatinine, Urine: 10 mg/dL
Total Protein, Urine: <6 mg/dL

## 2015-01-27 LAB — URINALYSIS, ROUTINE W REFLEX MICROSCOPIC
Bilirubin Urine: NEGATIVE
Glucose, UA: NEGATIVE mg/dL
Ketones, ur: NEGATIVE mg/dL
Leukocytes, UA: NEGATIVE
Nitrite: NEGATIVE
Protein, ur: NEGATIVE mg/dL
Specific Gravity, Urine: <1.005 — ABNORMAL LOW (ref 1.005–1.030)
Urobilinogen, UA: 0.2 mg/dL (ref 0.0–1.0)
pH: 6.5 (ref 5.0–8.0)

## 2015-01-27 LAB — URINE MICROSCOPIC-ADD ON

## 2015-01-27 MED ORDER — MAGNESIUM SULFATE 40 G IN LACTATED RINGERS - SIMPLE
2.0000 g/h | INTRAVENOUS | Status: DC
  Administered 2015-01-27 – 2015-01-28 (×2): 2 g/h via INTRAVENOUS
  Filled 2015-01-27 (×2): qty 500

## 2015-01-27 MED ORDER — CALCIUM CARBONATE ANTACID 500 MG PO CHEW: 2.0000 | CHEWABLE_TABLET | ORAL | Status: DC | PRN

## 2015-01-27 MED ORDER — LABETALOL HCL 5 MG/ML IV SOLN
40.0000 mg | Freq: Once | INTRAVENOUS | Status: AC
  Administered 2015-01-27: 40 mg via INTRAVENOUS
  Filled 2015-01-27: qty 8

## 2015-01-27 MED ORDER — ACETAMINOPHEN 325 MG PO TABS
650.0000 mg | ORAL_TABLET | ORAL | Status: DC | PRN
  Administered 2015-01-28 – 2015-01-29 (×4): 650 mg via ORAL
  Filled 2015-01-27 (×4): qty 2

## 2015-01-27 MED ORDER — LABETALOL HCL 5 MG/ML IV SOLN: 20.0000 mg | Freq: Once | INTRAVENOUS | Status: DC

## 2015-01-27 MED ORDER — PRENATAL MULTIVITAMIN CH
1.0000 | ORAL_TABLET | Freq: Every day | ORAL | Status: DC
  Administered 2015-01-28 – 2015-01-29 (×2): 1 via ORAL
  Filled 2015-01-27 (×2): qty 1

## 2015-01-27 MED ORDER — LACTATED RINGERS IV SOLN
INTRAVENOUS | Status: DC
  Administered 2015-01-27 – 2015-01-28 (×4): via INTRAVENOUS

## 2015-01-27 MED ORDER — DOCUSATE SODIUM 100 MG PO CAPS
100.0000 mg | ORAL_CAPSULE | Freq: Every day | ORAL | Status: DC
  Administered 2015-01-28: 100 mg via ORAL
  Filled 2015-01-27 (×2): qty 1

## 2015-01-27 MED ORDER — IBUPROFEN 800 MG PO TABS
800.0000 mg | ORAL_TABLET | Freq: Once | ORAL | Status: AC
  Administered 2015-01-27: 800 mg via ORAL
  Filled 2015-01-27: qty 1

## 2015-01-27 MED ORDER — MAGNESIUM SULFATE BOLUS VIA INFUSION
4.0000 g | Freq: Once | INTRAVENOUS | Status: AC
  Administered 2015-01-27: 4 g via INTRAVENOUS
  Filled 2015-01-27: qty 500

## 2015-01-27 NOTE — Progress Notes (Signed)
Pt admitted to AICU. Spanish interpreter at bedside to discuss plan of care and initial assessment. All questions were answered. Pt verbalizes understanding of plan of care

## 2015-01-27 NOTE — MAU Note (Signed)
Vag del 0200 on 2/15.  Has had a headache today. Went coming back from the bathroom, had a gush of blood (vag), was light headed and vision became blurry. Always has high pressure,  Has not taken BP medication since d/c.

## 2015-01-27 NOTE — H&P (Signed)
Jennifer Holmes is a 29 y.o. [phone] who is PP x 3 days. She was DC home yesterday. She states that she has CHTN and was on metoprolol prior to the pregnancy, and during the pregnancy she was on Aldomet. She states that she has not taken anything for her blood pressure since leaving the hospital because she was not sure what to take. She states that today she was visiting the baby, who is still in the nursery, and she felt dizzy. She thought she was going to pass out, but she did not. She states that she has had a headache as well.   Past Medical History  Diagnosis Date  . Hypertension   . GERD (gastroesophageal reflux disease)   . PONV (postoperative nausea and vomiting)   . Blood transfusion without reported diagnosis   . Headache     Past Surgical History  Procedure Laterality Date  . Cesarean section      Family History  Problem Relation Age of Onset  . Hypertension Mother   . Heart disease Mother   . Hypertension Brother     History  Substance Use Topics  . Smoking status: Never Smoker   . Smokeless tobacco: Never Used  . Alcohol Use: No    Allergies:  Allergies  Allergen Reactions  . Lactose Intolerance (Gi) Other (See Comments)    Reaction: GI upset    Prescriptions prior to admission  Medication Sig Dispense Refill Last Dose  . aspirin EC 81 MG tablet Take 1 tablet (81 mg total) by mouth daily. For prevention of preeclampsia (Patient taking differently: Take 81 mg by mouth daily. ) 200 tablet 0 01/23/2015 at Unknown time  . calcium carbonate (TUMS - DOSED IN MG ELEMENTAL CALCIUM) 500 MG chewable tablet Chew 1 tablet by mouth daily as needed for indigestion or heartburn.    01/23/2015 at Unknown time  . ferrous sulfate 325 (65 FE) MG tablet Take 1 tablet (325 mg total) by mouth 2 (two) times daily with a meal. 30 tablet 0   . flintstones complete (FLINTSTONES) 60  MG chewable tablet Chew 2 tablets by mouth daily.   01/23/2015 at Unknown time  . ibuprofen (ADVIL,MOTRIN) 600 MG tablet Take 1 tablet (600 mg total) by mouth every 6 (six) hours. 30 tablet 0   . methyldopa (ALDOMET) 500 MG tablet Take 1 tablet (500 mg total) by mouth 2 (two) times daily. 60 tablet 1 01/23/2015 at Unknown time  . omeprazole (PRILOSEC) 40 MG capsule Take 1 capsule (40 mg total) by mouth daily. 30 capsule 3 Past Week at Unknown time  . promethazine (PHENERGAN) 25 MG tablet Take 1 tablet (25 mg total) by mouth every 6 (six) hours as needed for nausea or vomiting. 30 tablet 0 01/23/2015 at Unknown time    ROS Physical Exam   Blood pressure 178/103, pulse 98, temperature 98.6 F (37 C), temperature source Oral, resp. rate 20, last menstrual period 05/14/2014, currently breastfeeding.  Physical Exam  Nursing note and vitals reviewed. Constitutional: She is oriented to person, place, and time. She appears well-developed and well-nourished. No distress.  Cardiovascular: Normal rate.  Respiratory: Effort normal.  GI: Soft. There is no tenderness. There is no rebound.  Musculoskeletal: She exhibits edema.  Neurological: She is alert and oriented to person, place, and time. She has normal reflexes. She displays normal reflexes.  No clonus  Skin: Skin is warm and dry.  Psychiatric: She has a normal mood and affect.    MAU Course  Procedures   Lab Results Last 24 Hours    Results for orders placed or performed during the hospital encounter of 01/27/15 (from the past 24 hour(s))  CBC Status: Abnormal   Collection Time: 01/27/15 5:44 PM  Result Value Ref Range   WBC 5.6 4.0 - 10.5 K/uL   RBC 2.58 (L) 3.87 - 5.11 MIL/uL   Hemoglobin 7.3 (L) 12.0 - 15.0 g/dL   HCT 40.9 (L) 81.1 - 91.4 %   MCV 87.2 78.0 - 100.0 fL   MCH 28.3 26.0 - 34.0 pg   MCHC 32.4 30.0 - 36.0 g/dL   RDW 78.2 95.6 - 21.3 %    Platelets 336 150 - 400 K/uL  Comprehensive metabolic panel Status: Abnormal   Collection Time: 01/27/15 5:44 PM  Result Value Ref Range   Sodium 138 135 - 145 mmol/L   Potassium 3.3 (L) 3.5 - 5.1 mmol/L   Chloride 111 96 - 112 mmol/L   CO2 23 19 - 32 mmol/L   Glucose, Bld 88 70 - 99 mg/dL   BUN 9 6 - 23 mg/dL   Creatinine, Ser 0.86 (L) 0.50 - 1.10 mg/dL   Calcium 7.9 (L) 8.4 - 10.5 mg/dL   Total Protein 6.3 6.0 - 8.3 g/dL   Albumin 3.0 (L) 3.5 - 5.2 g/dL   AST 578 (H) 0 - 37 U/L   ALT 254 (H) 0 - 35 U/L   Alkaline Phosphatase 138 (H) 39 - 117 U/L   Total Bilirubin 0.5 0.3 - 1.2 mg/dL   GFR calc non Af Amer >90 >90 mL/min   GFR calc Af Amer >90 >90 mL/min   Anion gap 4 (L) 5 - 15  Urinalysis, Routine w reflex microscopic Status: Abnormal   Collection Time: 01/27/15 6:00 PM  Result Value Ref Range   Color, Urine STRAW (A) YELLOW   APPearance CLEAR CLEAR   Specific Gravity, Urine <1.005 (L) 1.005 - 1.030   pH 6.5 5.0 - 8.0   Glucose, UA NEGATIVE NEGATIVE mg/dL   Hgb urine dipstick LARGE (A) NEGATIVE   Bilirubin Urine NEGATIVE NEGATIVE   Ketones, ur NEGATIVE NEGATIVE mg/dL   Protein, ur NEGATIVE NEGATIVE mg/dL   Urobilinogen, UA 0.2 0.0 - 1.0 mg/dL   Nitrite NEGATIVE NEGATIVE   Leukocytes, UA NEGATIVE NEGATIVE  Urine microscopic-add on Status: None   Collection Time: 01/27/15 6:00 PM  Result Value Ref Range   Squamous Epithelial / LPF RARE RARE   RBC / HPF 0-2 <3 RBC/hpf   Bacteria, UA RARE RARE     1834: D/W Dr. Erin Fulling, will admit to AICU for magnesium therapy   Assessment and Plan  HELLP Syndrome Admit to AICU for magnesium therapy   Tawnya Crook 01/27/2015, 6:09 PM

## 2015-01-27 NOTE — MAU Provider Note (Signed)
History     CSN: [id number]  Arrival date and time: 01/27/15 1723   First Provider Initiated Contact with Patient 01/27/15 1803      Chief Complaint  Patient presents with  . Headache   HPI  Jennifer Holmes is a 29 y.o. [id number] who is PP x 3 days. She was DC home yesterday. She states that she has CHTN and was on metoprolol prior to the pregnancy, and during the pregnancy she was on Aldomet. She states that she has not taken anything for her blood pressure since leaving the hospital because she was not sure what to take. She states that today she was visiting the baby, who is still in the nursery, and she felt dizzy. She thought she was going to pass out, but she did not. She states that she has had a headache as well.   Past Medical History  Diagnosis Date  . Hypertension   . GERD (gastroesophageal reflux disease)   . PONV (postoperative nausea and vomiting)   . Blood transfusion without reported diagnosis   . Headache     Past Surgical History  Procedure Laterality Date  . Cesarean section      Family History  Problem Relation Age of Onset  . Hypertension Mother   . Heart disease Mother   . Hypertension Brother     History  Substance Use Topics  . Smoking status: Never Smoker   . Smokeless tobacco: Never Used  . Alcohol Use: No    Allergies:  Allergies  Allergen Reactions  . Lactose Intolerance (Gi) Other (See Comments)    Reaction:  GI upset    Prescriptions prior to admission  Medication Sig Dispense Refill Last Dose  . aspirin EC 81 MG tablet Take 1 tablet (81 mg total) by mouth daily. For prevention of preeclampsia (Patient taking differently: Take 81 mg by mouth daily. ) 200 tablet 0 01/23/2015 at Unknown time  . calcium carbonate (TUMS - DOSED IN MG ELEMENTAL CALCIUM) 500 MG chewable tablet Chew 1 tablet by mouth daily as needed for indigestion or heartburn.    01/23/2015 at Unknown time  . ferrous sulfate 325 (65 FE) MG tablet Take 1 tablet (325  mg total) by mouth 2 (two) times daily with a meal. 30 tablet 0   . flintstones complete (FLINTSTONES) 60 MG chewable tablet Chew 2 tablets by mouth daily.   01/23/2015 at Unknown time  . ibuprofen (ADVIL,MOTRIN) 600 MG tablet Take 1 tablet (600 mg total) by mouth every 6 (six) hours. 30 tablet 0   . methyldopa (ALDOMET) 500 MG tablet Take 1 tablet (500 mg total) by mouth 2 (two) times daily. 60 tablet 1 01/23/2015 at Unknown time  . omeprazole (PRILOSEC) 40 MG capsule Take 1 capsule (40 mg total) by mouth daily. 30 capsule 3 Past Week at Unknown time  . promethazine (PHENERGAN) 25 MG tablet Take 1 tablet (25 mg total) by mouth every 6 (six) hours as needed for nausea or vomiting. 30 tablet 0 01/23/2015 at Unknown time    ROS Physical Exam   Blood pressure 178/103, pulse 98, temperature 98.6 F (37 C), temperature source Oral, resp. rate 20, last menstrual period 05/14/2014, currently breastfeeding.  Physical Exam  Nursing note and vitals reviewed. Constitutional: She is oriented to person, place, and time. She appears well-developed and well-nourished. No distress.  Cardiovascular: Normal rate.   Respiratory: Effort normal.  GI: Soft. There is no tenderness. There is no rebound.  Musculoskeletal: She exhibits edema.  Neurological: She is alert and oriented to person, place, and time. She has normal reflexes. She displays normal reflexes.  No clonus   Skin: Skin is warm and dry.  Psychiatric: She has a normal mood and affect.    MAU Course  Procedures  Results for orders placed or performed during the hospital encounter of 01/27/15 (from the past 24 hour(s))  CBC     Status: Abnormal   Collection Time: 01/27/15  5:44 PM  Result Value Ref Range   WBC 5.6 4.0 - 10.5 K/uL   RBC 2.58 (L) 3.87 - 5.11 MIL/uL   Hemoglobin 7.3 (L) 12.0 - 15.0 g/dL   HCT 16.1 (L) 09.6 - 04.5 %   MCV 87.2 78.0 - 100.0 fL   MCH 28.3 26.0 - 34.0 pg   MCHC 32.4 30.0 - 36.0 g/dL   RDW 40.9 81.1 - 91.4 %    Platelets 336 150 - 400 K/uL  Comprehensive metabolic panel     Status: Abnormal   Collection Time: 01/27/15  5:44 PM  Result Value Ref Range   Sodium 138 135 - 145 mmol/L   Potassium 3.3 (L) 3.5 - 5.1 mmol/L   Chloride 111 96 - 112 mmol/L   CO2 23 19 - 32 mmol/L   Glucose, Bld 88 70 - 99 mg/dL   BUN 9 6 - 23 mg/dL   Creatinine, Ser 7.82 (L) 0.50 - 1.10 mg/dL   Calcium 7.9 (L) 8.4 - 10.5 mg/dL   Total Protein 6.3 6.0 - 8.3 g/dL   Albumin 3.0 (L) 3.5 - 5.2 g/dL   AST 956 (H) 0 - 37 U/L   ALT 254 (H) 0 - 35 U/L   Alkaline Phosphatase 138 (H) 39 - 117 U/L   Total Bilirubin 0.5 0.3 - 1.2 mg/dL   GFR calc non Af Amer >90 >90 mL/min   GFR calc Af Amer >90 >90 mL/min   Anion gap 4 (L) 5 - 15  Urinalysis, Routine w reflex microscopic     Status: Abnormal   Collection Time: 01/27/15  6:00 PM  Result Value Ref Range   Color, Urine STRAW (A) YELLOW   APPearance CLEAR CLEAR   Specific Gravity, Urine <1.005 (L) 1.005 - 1.030   pH 6.5 5.0 - 8.0   Glucose, UA NEGATIVE NEGATIVE mg/dL   Hgb urine dipstick LARGE (A) NEGATIVE   Bilirubin Urine NEGATIVE NEGATIVE   Ketones, ur NEGATIVE NEGATIVE mg/dL   Protein, ur NEGATIVE NEGATIVE mg/dL   Urobilinogen, UA 0.2 0.0 - 1.0 mg/dL   Nitrite NEGATIVE NEGATIVE   Leukocytes, UA NEGATIVE NEGATIVE  Urine microscopic-add on     Status: None   Collection Time: 01/27/15  6:00 PM  Result Value Ref Range   Squamous Epithelial / LPF RARE RARE   RBC / HPF 0-2 <3 RBC/hpf   Bacteria, UA RARE RARE   1834: D/W Dr. Erin Fulling, will admit to AICU for magnesium therapy   Assessment and Plan  HELLP Syndrome Admit to AICU for magnesium therapy   Tawnya Crook 01/27/2015, 6:09 PM

## 2015-01-27 NOTE — Lactation Note (Signed)
This note was copied from the chart of Jennifer Volney Pressersucena Friedlander. Lactation Consultation Note: Mom brought a NS from home- states she used one with her first baby for just 1 day. Trying to latch baby with NS when I went into room. Mom leaking lots of mature milk.Suggested trying without NS and mom finally agreeable. Baby took a few attempts then latched well and lots of swallows. Mom pleased. Has DEBP in room. Mom reports that pumping is going well with no pain. No questions at present.  Patient Name: Jennifer Holmes BJYNW'GToday's Date: 01/27/2015 Reason for consult: Follow-up assessment   Maternal Data Formula Feeding for Exclusion: Yes Reason for exclusion: Mother's choice to formula and breast feed on admission Has patient been taught Hand Expression?: Yes Does the patient have breastfeeding experience prior to this delivery?: Yes  Feeding Feeding Type: Breast Fed Length of feed: 10 min  LATCH Score/Interventions Latch: Repeated attempts needed to sustain latch, nipple held in mouth throughout feeding, stimulation needed to elicit sucking reflex.  Audible Swallowing: Spontaneous and intermittent  Type of Nipple: Everted at rest and after stimulation  Comfort (Breast/Nipple): Soft / non-tender     Hold (Positioning): Assistance needed to correctly position infant at breast and maintain latch.  LATCH Score: 8  Lactation Tools Discussed/Used     Consult Status Consult Status: Follow-up Date: 01/28/15 Follow-up type: In-patient    Pamelia HoitWeeks, Kear[place] Evitt D 01/27/2015, 11:32 AM

## 2015-01-28 ENCOUNTER — Encounter (HOSPITAL_COMMUNITY): Payer: Self-pay | Admitting: *Deleted

## 2015-01-28 LAB — CBC
HCT: 21 % — ABNORMAL LOW (ref 36.0–46.0)
Hemoglobin: 6.9 g/dL — CL (ref 12.0–15.0)
MCH: 28.8 pg (ref 26.0–34.0)
MCHC: 32.9 g/dL (ref 30.0–36.0)
MCV: 87.5 fL (ref 78.0–100.0)
Platelets: 344 10*3/uL (ref 150–400)
RBC: 2.4 MIL/uL — ABNORMAL LOW (ref 3.87–5.11)
RDW: 15.4 % (ref 11.5–15.5)
WBC: 5.4 10*3/uL (ref 4.0–10.5)

## 2015-01-28 LAB — COMPREHENSIVE METABOLIC PANEL
ALT: 241 U/L — ABNORMAL HIGH (ref 0–35)
AST: 486 U/L — ABNORMAL HIGH (ref 0–37)
Albumin: 2.7 g/dL — ABNORMAL LOW (ref 3.5–5.2)
Alkaline Phosphatase: 131 U/L — ABNORMAL HIGH (ref 39–117)
Anion gap: 8 (ref 5–15)
BUN: 6 mg/dL (ref 6–23)
CO2: 23 mmol/L (ref 19–32)
Calcium: 6.9 mg/dL — ABNORMAL LOW (ref 8.4–10.5)
Chloride: 109 mmol/L (ref 96–112)
Creatinine, Ser: 0.33 mg/dL — ABNORMAL LOW (ref 0.50–1.10)
GFR calc Af Amer: >90 mL/min (ref >90)
GFR calc non Af Amer: >90 mL/min (ref >90)
Glucose, Bld: 86 mg/dL (ref 70–99)
Potassium: 3.3 mmol/L — ABNORMAL LOW (ref 3.5–5.1)
Sodium: 140 mmol/L (ref 135–145)
Total Bilirubin: 0.6 mg/dL (ref 0.3–1.2)
Total Protein: 5.7 g/dL — ABNORMAL LOW (ref 6.0–8.3)

## 2015-01-28 NOTE — Progress Notes (Signed)
I ordered lunch for the patient. Jennifer Holmes  Interpreter. °

## 2015-01-28 NOTE — Progress Notes (Signed)
Post Partum Day 4 Subjective: Pt c/o feeling excesively tired and sleepy. HA improved over yesterday and overall she feels better than yesterday.   Denies feeling dizzy. Denies SOB  Objective: Blood pressure 119/75, pulse 93, temperature 98.6 F (37 C), temperature source Oral, resp. rate 16, height 5' (1.524 m), weight 179 lb 8 oz (81.421 kg), last menstrual period 05/14/2014, SpO2 98 %, currently breastfeeding.  Physical Exam:  General: no distress, toxic and lethargic. Oriented Responds appropriately.  Lochia: heavy with clots yesterday.  Improved today Uterine Fundus: firm DVT Evaluation: No evidence of DVT seen on physical exam.   Recent Labs  01/27/15 1744 01/28/15 0515  HGB 7.3* 6.9*  HCT 22.5* 21.0*   CMP Latest Ref Rng 01/28/2015 01/27/2015 01/23/2015  Glucose 70 - 99 mg/dL 86 88 89  BUN 6 - 23 mg/dL 6 9 5(L)  Creatinine 1.610.50 - 1.10 mg/dL 0.96(E0.33(L) 4.54(U0.37(L) 9.81(X0.38(L)  Sodium 135 - 145 mmol/L 140 138 137  Potassium 3.5 - 5.1 mmol/L 3.3(L) 3.3(L) 3.2(L)  Chloride 96 - 112 mmol/L 109 111 109  CO2 19 - 32 mmol/L 23 23 21   Calcium 8.4 - 10.5 mg/dL 6.9(L) 7.9(L) 8.4  Total Protein 6.0 - 8.3 g/dL 9.1(Y5.7(L) 6.3 7.0  Total Bilirubin 0.3 - 1.2 mg/dL 0.6 0.5 0.4  Alkaline Phos 39 - 117 U/L 131(H) 138(H) 168(H)  AST 0 - 37 U/L 486(H) 629(H) 46(H)  ALT 0 - 35 U/L 241(H) 254(H) 27   CBC    Component Value Date/Time   WBC 5.4 01/28/2015 0515   RBC 2.40* 01/28/2015 0515   HGB 6.9* 01/28/2015 0515   HGB 13.4 07/15/2014   HCT 21.0* 01/28/2015 0515   HCT 40 07/15/2014   PLT 344 01/28/2015 0515   PLT 330 07/15/2014   MCV 87.5 01/28/2015 0515   MCH 28.8 01/28/2015 0515   MCHC 32.9 01/28/2015 0515   RDW 15.4 01/28/2015 0515   LYMPHSABS 2.7 08/23/2012 1143   MONOABS 0.4 08/23/2012 1143   EOSABS 0.4 08/23/2012 1143   BASOSABS 0.1 08/23/2012 1143     Assessment/Plan: Pt with PP HELLP as determined by elevated BP's and elevated LFT's.  Good diuresis.  Clinically imrproving   CMP in  the am. Keep Magnesium sulfate  LOS: 1 day   Holmes, Jennifer Mckinstry 01/28/2015, 8:14 AM

## 2015-01-28 NOTE — Progress Notes (Signed)
I assisted Dr. Harraway-Smith with explanation of care plan. Eda H Royal Interpreter. °

## 2015-01-28 NOTE — Progress Notes (Signed)
UR chart review completed.  

## 2015-01-28 NOTE — Progress Notes (Signed)
CRITICAL VALUE ALERT  Critical value received:  6.9 hemoglobin  Date of notification:  01/28/2015   Time of notification:  0550  Critical value read back: yes  Nurse who received alert:  Gloris ManchesterBrenda Keys, RN  MD notified (1st page):    Time of first page:  [phone]  MD notified (2nd page):  Time of second page: 0600  Responding MD:  Regino BellowAlaina Adamo   Time MD responded:  0600

## 2015-01-29 DIAGNOSIS — O1420 HELLP syndrome (HELLP), unspecified trimester: Secondary | ICD-10-CM

## 2015-01-29 LAB — COMPREHENSIVE METABOLIC PANEL
ALT: 261 U/L — ABNORMAL HIGH (ref 0–35)
ALT: 247 U/L — ABNORMAL HIGH (ref 0–35)
AST: 333 U/L — ABNORMAL HIGH (ref 0–37)
AST: 315 U/L — ABNORMAL HIGH (ref 0–37)
Albumin: 3 g/dL — ABNORMAL LOW (ref 3.5–5.2)
Albumin: 2.9 g/dL — ABNORMAL LOW (ref 3.5–5.2)
Alkaline Phosphatase: 145 U/L — ABNORMAL HIGH (ref 39–117)
Alkaline Phosphatase: 136 U/L — ABNORMAL HIGH (ref 39–117)
Anion gap: 5 (ref 5–15)
Anion gap: 4 — ABNORMAL LOW (ref 5–15)
BUN: 5 mg/dL — ABNORMAL LOW (ref 6–23)
BUN: 9 mg/dL (ref 6–23)
CO2: 27 mmol/L (ref 19–32)
CO2: 27 mmol/L (ref 19–32)
Calcium: 6.9 mg/dL — ABNORMAL LOW (ref 8.4–10.5)
Calcium: 7.6 mg/dL — ABNORMAL LOW (ref 8.4–10.5)
Chloride: 107 mmol/L (ref 96–112)
Chloride: 109 mmol/L (ref 96–112)
Creatinine, Ser: 0.35 mg/dL — ABNORMAL LOW (ref 0.50–1.10)
Creatinine, Ser: 0.44 mg/dL — ABNORMAL LOW (ref 0.50–1.10)
GFR calc Af Amer: >90 mL/min (ref >90)
GFR calc Af Amer: >90 mL/min (ref >90)
GFR calc non Af Amer: >90 mL/min (ref >90)
GFR calc non Af Amer: >90 mL/min (ref >90)
Glucose, Bld: 92 mg/dL (ref 70–99)
Glucose, Bld: 97 mg/dL (ref 70–99)
Potassium: 3.4 mmol/L — ABNORMAL LOW (ref 3.5–5.1)
Potassium: 3.3 mmol/L — ABNORMAL LOW (ref 3.5–5.1)
Sodium: 139 mmol/L (ref 135–145)
Sodium: 140 mmol/L (ref 135–145)
Total Bilirubin: 0.4 mg/dL (ref 0.3–1.2)
Total Bilirubin: 0.4 mg/dL (ref 0.3–1.2)
Total Protein: 6.4 g/dL (ref 6.0–8.3)
Total Protein: 6.1 g/dL (ref 6.0–8.3)

## 2015-01-29 LAB — CBC
HCT: 23.3 % — ABNORMAL LOW (ref 36.0–46.0)
Hemoglobin: 7.7 g/dL — ABNORMAL LOW (ref 12.0–15.0)
MCH: 29.2 pg (ref 26.0–34.0)
MCHC: 33 g/dL (ref 30.0–36.0)
MCV: 88.3 fL (ref 78.0–100.0)
Platelets: 394 10*3/uL (ref 150–400)
RBC: 2.64 MIL/uL — ABNORMAL LOW (ref 3.87–5.11)
RDW: 16.4 % — ABNORMAL HIGH (ref 11.5–15.5)
WBC: 6.7 10*3/uL (ref 4.0–10.5)

## 2015-01-29 MED ORDER — MISOPROSTOL 200 MCG PO TABS
200.0000 ug | ORAL_TABLET | Freq: Once | ORAL | Status: AC
  Administered 2015-01-29: 200 ug via ORAL
  Filled 2015-01-29: qty 1

## 2015-01-29 NOTE — Progress Notes (Signed)
Post Partum Day 4 Subjective: Headache is improved  Objective: Blood pressure 127/77, pulse 85, temperature 99 F (37.2 C), temperature source Oral, resp. rate 18, height 5' (1.524 m), weight 179 lb 8 oz (81.421 kg), last menstrual period 05/14/2014, SpO2 98 %, currently breastfeeding.  Filed Vitals:   01/29/15 0208 01/29/15 0300 01/29/15 0400 01/29/15 0500  BP: 131/78 129/73 136/78 127/77  Pulse: 95 88 84 85  Temp:      TempSrc:      Resp: 18     Height:      Weight:      SpO2: 98% 96% 97% 98%    Physical Exam:  General: alert, cooperative and no distress Lochia: appropriate Uterine Fundus: firm Incision:  DVT Evaluation: No evidence of DVT seen on physical exam.  Results for orders placed or performed during the hospital encounter of 01/27/15 (from the past 24 hour(s))  CBC     Status: Abnormal   Collection Time: 01/29/15  7:18 AM  Result Value Ref Range   WBC 6.7 4.0 - 10.5 K/uL   RBC 2.64 (L) 3.87 - 5.11 MIL/uL   Hemoglobin 7.7 (L) 12.0 - 15.0 g/dL   HCT 16.123.3 (L) 09.636.0 - 04.546.0 %   MCV 88.3 78.0 - 100.0 fL   MCH 29.2 26.0 - 34.0 pg   MCHC 33.0 30.0 - 36.0 g/dL   RDW 40.916.4 (H) 81.111.5 - 91.415.5 %   Platelets 394 150 - 400 K/uL    Recent Labs  01/28/15 0515 01/29/15 0718  HGB 6.9* 7.7*  HCT 21.0* 23.3*    Assessment/Plan: DC mag sulfate and assess for transfer BO improved   LOS: 2 days   Ubaldo Daywalt H 01/29/2015, 7:43 AM

## 2015-01-29 NOTE — Progress Notes (Addendum)
Left message with Greg Cutter. [place] [facility], per MD order.  Requested home visit for BP check.

## 2015-01-29 NOTE — Discharge Summary (Signed)
Physician Discharge Summary  Patient ID: Jennifer Holmes MRN: [id number] DOB/AGE: 07-Dec-1986 29 y.o.  Admit date: 01/27/2015 Discharge date: 01/29/2015  Admission Diagnoses: severe preeclampsia   Discharge Diagnoses: same Active Problems:   HELLP syndrome   Discharged Condition: good  Hospital Course: Patient admitted for the management of postpartum preeclampsia. Patient presented with a severe headache, elevated BP and abnormal LFTs. She was admitted and received magnesium sulfate for seizure prophylaxis with resolution of her symptoms and the slow normalization of both her BP and lab values. Patient is stable for discharge. Discharge instructions reviewed with the patient  Consults: None  Treatments: magnesium sulfate  Discharge Exam: Blood pressure 116/61, pulse 85, temperature 98.3 F (36.8 C), temperature source Oral, resp. rate 16, height 5' (1.524 m), weight 179 lb 8 oz (81.421 kg), last menstrual period 05/14/2014, SpO2 98 %, currently breastfeeding. General appearance: alert, cooperative and no distress Resp: clear to auscultation bilaterally Cardio: regular rate and rhythm Extremities: extremities normal, atraumatic, no cyanosis or edema and 2+ DTR  Disposition: 01-Home or Self Care     Medication List    STOP taking these medications        aspirin EC 81 MG tablet     methyldopa 500 MG tablet  Commonly known as:  ALDOMET      TAKE these medications        calcium carbonate 500 MG chewable tablet  Commonly known as:  TUMS - dosed in mg elemental calcium  Chew 2 tablets by mouth daily as needed for indigestion or heartburn.     flintstones complete 60 MG chewable tablet  Chew 2 tablets by mouth daily.     omeprazole 40 MG capsule  Commonly known as:  PRILOSEC  Take 1 capsule (40 mg total) by mouth daily.       Follow-up Information    [facility]   Specialty:  Obstetrics and Gynecology   Contact  information:   [address] [place] [id number] [phone]      Signed: Catalina AntiguaCONSTANT,Jennifer Winiarski 01/29/2015, 2:47 PM

## 2015-01-29 NOTE — Progress Notes (Signed)
Pt discharged home with family via W/C, accompanied by RN. Pt without complaints. VSS. Discharge instructions provided-interpreter present. Pt verbalized understanding.

## 2015-01-30 NOTE — Progress Notes (Signed)
Assisted RN with interpretation of discharge instructions for mother and baby patients. Spanish Interpreter

## 2015-02-02 ENCOUNTER — Encounter: Payer: Self-pay | Admitting: Obstetrics & Gynecology

## 2015-03-10 ENCOUNTER — Ambulatory Visit (INDEPENDENT_AMBULATORY_CARE_PROVIDER_SITE_OTHER): Payer: Self-pay | Admitting: Obstetrics & Gynecology

## 2015-03-10 ENCOUNTER — Encounter: Payer: Self-pay | Admitting: Obstetrics & Gynecology

## 2015-03-10 DIAGNOSIS — I10 Essential (primary) hypertension: Secondary | ICD-10-CM

## 2015-03-10 NOTE — Progress Notes (Signed)
Jennifer Eli Lilly and CompanyMora Spanish Interpreter.

## 2015-03-10 NOTE — Progress Notes (Signed)
Patient ID: Jennifer Holmes, female   DOB: [birth year], 29 y.o.   MRN: [id number] Subjective:wants sterilization     Jennifer Holmes is a 29 y.o. female who presents for a postpartum visit. She is 6 weeks postpartum following a vaginal. I have fully reviewed the prenatal and intrapartum course. The delivery was at 39 gestational weeks. Outcome: spontaneous vaginal delivery. Anesthesia: epidural. Postpartum course has been complicated by HELLP syndrome. Baby's course has been good. Baby is feeding by Breast. Bleeding no bleeding. Bowel function is normal. Bladder function is normal. Patient is not sexually active. Contraception method is Condoms. Postpartum depression screening: 5 .  The following portions of the patient's history were reviewed and updated as appropriate: allergies, current medications, past family history, past medical history, past social history, past surgical history and problem list.  Review of Systems Pertinent items are noted in HPI.   Objective:    BP 105/70 mmHg  Pulse 73  Temp(Src) 98.2 F (36.8 C)  Wt 173 lb 14.4 oz (78.881 kg)  LMP 05/14/2014  Breastfeeding? Yes  General:  alert, cooperative and no distress   Breasts:           Abdomen: soft, non-tender; bowel sounds normal; no masses,  no organomegaly   Vulva:  normal  Vagina: vagina positive for discharge, wet prep done  Cervix:  no lesions  Corpus: not examined  Adnexa:  not evaluated  Rectal Exam: Not performed.        Assessment:     6 weeks postpartum exam. Pap smear not done at today's visit.   Plan:    1. Contraception: condoms 2. Continue BP med until f/u with Dr Sharene ButtersWilliams  James G Arnold, MD 03/10/2015

## 2015-03-10 NOTE — Patient Instructions (Signed)
Hipertensión °(Hypertension) °La hipertensión, conocida comúnmente como presión arterial alta, se produce cuando la sangre bombea en las arterias con mucha fuerza. Las arterias son los vasos sanguíneos que transportan la sangre desde el corazón hacia todas las partes del cuerpo. Una lectura de la presión arterial consiste en un número más alto sobre un número más bajo, por ejemplo, 110/72. El número más alto (presión sistólica) corresponde a la presión interna de las arterias cuando el corazón bombea sangre. El número más bajo (presión diastólica) corresponde a la presión interna de las arterias cuando el corazón se relaja. En condiciones ideales, la presión arterial debe ser inferior a 120/80. °La hipertensión fuerza al corazón a trabajar más para bombear la sangre. Las arterias pueden estrecharse o ponerse rígidas. La hipertensión conlleva el riesgo de enfermedad cardíaca, ictus y otros problemas.  °FACTORES DE RIESGO °Algunos factores de riesgo de hipertensión son controlables, pero otros no lo son.  °Entre los factores de riesgo que usted no puede controlar, se incluyen:  °· La raza. El riesgo es mayor para las personas afroamericanas. °· La edad. Los riesgos aumentan con la edad. °· El sexo. Antes de los 45 años, los hombres corren más riesgo que las mujeres. Después de los 65 años, las mujeres corren más riesgo que los hombres. °Entre los factores de riesgo que usted puede controlar, se incluyen: °· No hacer la cantidad suficiente de actividad física o ejercicio. °· Tener sobrepeso. °· Consumir mucha grasa, azúcar, calorías o sal en la dieta. °· Beber alcohol en exceso. °SIGNOS Y SÍNTOMAS °Por lo general, la hipertensión no causa signos o síntomas. La hipertensión demasiado alta (crisis hipertensiva) puede causar dolor de cabeza, ansiedad, falta de aire y hemorragia nasal. °DIAGNÓSTICO  °Para detectar si usted tiene hipertensión, el médico le medirá la presión arterial mientras esté sentado, con el brazo  levantado a la altura del corazón. Debe medirla al menos dos veces en el mismo brazo. Determinadas condiciones pueden causar una diferencia de presión arterial entre el brazo izquierdo y el derecho. El hecho de tener una sola lectura de la presión arterial más alta que lo normal no significa que necesita un tratamiento. En el caso de tener una lectura de la presión arterial con un valor alto, pídale al médico que la verifique nuevamente. °TRATAMIENTO  °El tratamiento de la hipertensión arterial incluye hacer cambios en el estilo de vida y, posiblemente, tomar medicamentos. Un estilo de vida saludable puede ayudar a bajar la presión arterial alta. Quizá deba cambiar algunos hábitos. °Los cambios en el estilo de vida pueden incluir: °· Seguir la dieta DASH. Esta dieta tiene un alto contenido de frutas, verduras y cereales integrales. Incluye poca cantidad de sal, carnes rojas y azúcares agregados. °· Hacer al menos 2 ½ horas de actividad física enérgica todas las semanas. °· Perder peso, si es necesario. °· No fumar. °· Limitar el consumo de bebidas alcohólicas. °· Aprender formas de reducir el estrés. °Si los cambios en el estilo de vida no son suficientes para lograr controlar la presión arterial, el médico puede recetarle medicamentos. Quizá necesite tomar más de uno. Trabaje en conjunto con su médico para comprender los riesgos y los beneficios. °INSTRUCCIONES PARA EL CUIDADO EN EL HOGAR °· Haga que le midan de nuevo la presión arterial según las indicaciones del médico. °· Tome los medicamentos solamente como se lo haya indicado el médico. Siga cuidadosamente las indicaciones. Los medicamentos para la presión arterial deben tomarse según las indicaciones. Los medicamentos pierden eficacia al omitir las dosis. El hecho de omitir   las dosis también aumenta el riesgo de otros problemas. °· No fume. °· Contrólese la presión arterial en su casa según las indicaciones del médico. °SOLICITE ATENCIÓN MÉDICA SI:  °· Piensa  que tiene una reacción alérgica a los medicamentos. °· Tiene mareos o dolores de cabeza con recurrencia. °· Tiene hinchazón en los tobillos. °· Tiene problemas de visión. °SOLICITE ATENCIÓN MÉDICA DE INMEDIATO SI: °· Siente un dolor de cabeza intenso o confusión. °· Siente debilidad inusual, adormecimiento o que se desmayará. °· Siente dolor intenso en el pecho o en el abdomen. °· Vomita repetidas veces. °· Tiene dificultad para respirar. °ASEGÚRESE DE QUE:  °· Comprende estas instrucciones. °· Controlará su afección. °· Recibirá ayuda de inmediato si no mejora o si empeora. °Document Released: 11/26/2005 Document Revised: 04/12/2014 °ExitCare® Patient Information ©2015 ExitCare, LLC. This information is not intended to replace advice given to you by your health care provider. Make sure you discuss any questions you have with your health care provider. ° °

## 2015-03-11 LAB — WET PREP, GENITAL: Trich, Wet Prep: NONE SEEN

## 2015-03-14 NOTE — Progress Notes (Deleted)
Subjective:     Jennifer Ezekiel SlocumbGuadalupe Holmes is a 29 y.o. female who presents for a postpartum visit. She is 6 weeks postpartum following a spontaneous vaginal delivery. I have fully reviewed the prenatal and intrapartum course. The delivery was at 37 gestational weeks. Outcome: spontaneous vaginal delivery. Anesthesia: epidural. Postpartum course has been good. Baby's course has been normal. Baby is feeding by breast. Bleeding no bleeding. Bowel function is normal. Bladder function is normal. Patient is not sexually active. Contraception method is condoms. Postpartum depression screening: negative.  The following portions of the patient's history were reviewed and updated as appropriate: allergies, current medications, past family history, past medical history, past social history, past surgical history and problem list.  Review of Systems Pertinent items are noted in HPI.   Objective:    BP 105/70 mmHg  Pulse 73  Temp(Src) 98.2 F (36.8 C)  Wt 78.881 kg (173 lb 14.4 oz)  LMP 05/14/2014  Breastfeeding? Yes  General:  alert, cooperative and no distress                 normal  Vagina: slight d/c  Cervix:  no lesions  Corpus: normal  Adnexa:  not evaluated  Rectal Exam: Not performed.        Assessment:     normal postpartum exam. Pap smear not done at today's visit.   Plan:    1. Contraception: condoms 2.  3. Follow up as needed.    Adam PhenixJames G Arnold, MD

## 2015-03-15 ENCOUNTER — Telehealth: Payer: Self-pay

## 2015-03-15 DIAGNOSIS — B373 Candidiasis of vulva and vagina: Secondary | ICD-10-CM

## 2015-03-15 DIAGNOSIS — N760 Acute vaginitis: Principal | ICD-10-CM

## 2015-03-15 DIAGNOSIS — B3731 Acute candidiasis of vulva and vagina: Secondary | ICD-10-CM

## 2015-03-15 DIAGNOSIS — B9689 Other specified bacterial agents as the cause of diseases classified elsewhere: Secondary | ICD-10-CM

## 2015-03-15 MED ORDER — METRONIDAZOLE 500 MG PO TABS: 500.0000 mg | ORAL_TABLET | Freq: Two times a day (BID) | ORAL | Status: DC

## 2015-03-15 MED ORDER — FLUCONAZOLE 150 MG PO TABS: 150.0000 mg | ORAL_TABLET | Freq: Once | ORAL | Status: DC

## 2015-03-15 NOTE — Telephone Encounter (Signed)
Wet prep shows yeast and BV. Diflucan 150mg  X 1 dose and flagyl 500mg  BID X 7 days e-prescribed per protocol. Attempted to contact patient with spanish interpreter [employer] Mor[employer]. No answer and no voicemail box set up. Unable to leave message.

## 2015-03-16 NOTE — Telephone Encounter (Signed)
Called patient with Marly for interpreter and informed patient of BV and yeast infection and informed patient of medications sent to pharmacy. Patient verbalized understanding to all and patient asked if she needed a follow up with us after treatment. Told patient no but she may call us back if she still has symptoms. Patient verbalized understanding and had no other questions

## 2015-04-21 ENCOUNTER — Encounter (HOSPITAL_COMMUNITY): Payer: Self-pay | Admitting: Emergency Medicine

## 2015-04-21 ENCOUNTER — Emergency Department (INDEPENDENT_AMBULATORY_CARE_PROVIDER_SITE_OTHER)
Admission: EM | Admit: 2015-04-21 | Discharge: 2015-04-21 | Disposition: A | Discharge status: Home / Self Care | Payer: Self-pay | Source: Home / Self Care | Attending: Family Medicine | Admitting: Family Medicine

## 2015-04-21 DIAGNOSIS — N390 Urinary tract infection, site not specified: Secondary | ICD-10-CM

## 2015-04-21 LAB — POCT PREGNANCY, URINE: Preg Test, Ur: NEGATIVE

## 2015-04-21 LAB — POCT URINALYSIS DIP (DEVICE)
Bilirubin Urine: NEGATIVE
Glucose, UA: NEGATIVE mg/dL
Ketones, ur: NEGATIVE mg/dL
Leukocytes, UA: NEGATIVE
Nitrite: NEGATIVE
Protein, ur: 30 mg/dL — AB
Specific Gravity, Urine: 1.025 (ref 1.005–1.030)
Urobilinogen, UA: 1 mg/dL (ref 0.0–1.0)
pH: 6.5 (ref 5.0–8.0)

## 2015-04-21 MED ORDER — CEPHALEXIN 500 MG PO CAPS: 500.0000 mg | ORAL_CAPSULE | Freq: Two times a day (BID) | ORAL | Status: DC

## 2015-04-21 NOTE — ED Notes (Signed)
Pt triaged and assessed by provider.  Provider in room before nurse.

## 2015-04-21 NOTE — Discharge Instructions (Signed)
Thank you for coming in today. If your belly pain worsens, or you have high fever, bad vomiting, blood in your stool or black tarry stool go to the Emergency Room.   Infeccin urinaria  (Urinary Tract Infection)  La infeccin urinaria puede ocurrir en Corporate treasurercualquier lugar del tracto urinario. El tracto urinario es un sistema de drenaje del cuerpo por el que se eliminan los desechos y el exceso de [place]. El tracto urinario est formado por dos riones, dos urteres, la vejiga y Engineer, miningla uretra. Los riones son rganos que tienen forma de frijol. Cada rin tiene aproximadamente el tamao del puo. Estn situados debajo de las Rodri­guez Heviacostillas, uno a cada lado de la columna vertebral CAUSAS  La causa de la infeccin son los microbios, que son organismos microscpicos, que incluyen hongos, virus, y bacterias. Estos organismos son tan pequeos que slo pueden verse a travs del microscopio. Las bacterias son los microorganismos que ms comnmente causan infecciones urinarias.  SNTOMAS  Los sntomas pueden variar segn la edad y el sexo del paciente y por la ubicacin de la infeccin. Los sntomas en las mujeres jvenes incluyen la necesidad frecuente e intensa de orinar y una sensacin dolorosa de ardor en la vejiga o en la uretra durante la miccin. Las mujeres y los hombres mayores podrn sentir cansancio, temblores y debilidad y Futures tradersentir dolores musculares y Engineer, miningdolor abdominal. Si tiene Muddyfiebre, puede significar que la infeccin est en los riones. Otros sntomas son dolor en la espalda o en los lados debajo de las [place] Eliotcostillas, nuseas y vmitos.  DIAGNSTICO  Para diagnosticar una infeccin urinaria, el mdico le preguntar acerca de sus sntomas. Genuine Partsambin le solicitar una Ivanhoemuestra de [place]ina. La muestra de orina se analiza para Engineer, manufacturingdetectar bacterias y glbulos blancos de Risk managerla sangre. Los glbulos blancos se forman en el organismo para ayudar a Artistcombatir las infecciones.  TRATAMIENTO  Por lo general, las infecciones urinarias pueden  tratarse con medicamentos. Debido a que la Harley-Davidsonmayora de las infecciones son causadas por bacterias, por lo general pueden tratarse con antibiticos. La eleccin del antibitico y la duracin del tratamiento depender de sus sntomas y el tipo de bacteria causante de la infeccin.  INSTRUCCIONES PARA EL CUIDADO EN EL HOGAR   Si le recetaron antibiticos, tmelos exactamente como su mdico le indique. Termine el medicamento aunque se sienta mejor despus de haber tomado slo algunos.  Beba gran cantidad de lquido para mantener la orina de tono claro o color amarillo plido.  Evite la cafena, el t y las 250 Hospital Placebebidas gaseosas. Estas sustancias irritan la vejiga.  Vaciar la vejiga con frecuencia. Evite retener la orina durante largos perodos.  Vace la vejiga antes y despus de Management consultanttener relaciones sexuales.  Despus de mover el intestino, las mujeres deben higienizarse la regin perineal desde adelante hacia atrs. Use slo un papel tissue por vez. SOLICITE ATENCIN MDICA SI:   Siente dolor en la espalda.  Le sube la fiebre.  Los sntomas no mejoran luego de [address] das. SOLICITE ATENCIN MDICA DE INMEDIATO SI:   Siente dolor intenso en la espalda o en la zona inferior del abdomen.  Comienza a sentir escalofros.  Tiene nuseas o vmitos.  Tiene una sensacin continua de quemazn o molestias al ConocoPhillipsorinar. ASEGRESE DE QUE:   Comprende estas instrucciones.  Controlar su enfermedad.  Solicitar ayuda de inmediato si no mejora o empeora. Document Released: 09/05/2005 Document Revised: 08/20/2012 [facility] Patient Information 2015 [place], [place]. This information is not intended to replace advice given to you by your health  care provider. Make sure you discuss any questions you have with your health care provider.

## 2015-04-21 NOTE — ED Provider Notes (Signed)
Jennifer Holmes is a 29 y.o. female who presents to Urgent Care today for urinary frequency urgency and dysuria present for a few days associated with some upset stomach. No chest pains or palpitations or abdominal pain. She feels well otherwise. She is proximally 2 months postpartum.   Past Medical History  Diagnosis Date  . Hypertension   . GERD (gastroesophageal reflux disease)   . PONV (postoperative nausea and vomiting)   . Blood transfusion without reported diagnosis   . Headache    Past Surgical History  Procedure Laterality Date  . Cesarean section     History  Substance Use Topics  . Smoking status: Never Smoker   . Smokeless tobacco: Never Used  . Alcohol Use: No   ROS as above Medications: No current facility-administered medications for this encounter.   Current Outpatient Prescriptions  Medication Sig Dispense Refill  . calcium carbonate (TUMS - DOSED IN MG ELEMENTAL CALCIUM) 500 MG chewable tablet Chew 2 tablets by mouth daily as needed for indigestion or heartburn.     . cephALEXin (KEFLEX) 500 MG capsule Take 1 capsule (500 mg total) by mouth 2 (two) times daily. 14 capsule 0  . flintstones complete (FLINTSTONES) 60 MG chewable tablet Chew 2 tablets by mouth daily.    . fluconazole (DIFLUCAN) 150 MG tablet Take 1 tablet (150 mg total) by mouth once. 1 tablet 0  . methyldopa (ALDOMET) 250 MG tablet Take 250 mg by mouth 2 (two) times daily.    . metroNIDAZOLE (FLAGYL) 500 MG tablet Take 1 tablet (500 mg total) by mouth 2 (two) times daily. 14 tablet 0  . omeprazole (PRILOSEC) 40 MG capsule Take 1 capsule (40 mg total) by mouth daily. 30 capsule 3   Allergies  Allergen Reactions  . Lactose Intolerance (Gi) Other (See Comments)    Reaction:  GI upset     Exam:  BP 142/86 mmHg  Pulse 70  Temp(Src) 98.1 F (36.7 C) (Oral)  Resp 16  SpO2 97%  LMP 05/14/2014 Gen: Well NAD HEENT: EOMI,  MMM Lungs: Normal work of breathing. CTABL Heart: RRR no  MRG Abd: NABS, Soft. Nondistended, Nontender no CV angle tenderness to percussion Exts: Brisk capillary refill, warm and well perfused.   Results for orders placed or performed during the hospital encounter of 04/21/15 (from the past 24 hour(s))  POCT urinalysis dip (device)     Status: Abnormal   Collection Time: 04/21/15  1:56 PM  Result Value Ref Range   Glucose, UA NEGATIVE NEGATIVE mg/dL   Bilirubin Urine NEGATIVE NEGATIVE   Ketones, ur NEGATIVE NEGATIVE mg/dL   Specific Gravity, Urine 1.025 1.005 - 1.030   Hgb urine dipstick TRACE (A) NEGATIVE   pH 6.5 5.0 - 8.0   Protein, ur 30 (A) NEGATIVE mg/dL   Urobilinogen, UA 1.0 0.0 - 1.0 mg/dL   Nitrite NEGATIVE NEGATIVE   Leukocytes, UA NEGATIVE NEGATIVE  Pregnancy, urine POC     Status: None   Collection Time: 04/21/15  2:01 PM  Result Value Ref Range   Preg Test, Ur NEGATIVE NEGATIVE   No results found.  Assessment and Plan: 29 y.o. female with probable UTI. Culture pending. Treat with Keflex. Follow-up with OB/GYN.  Discussed warning signs or symptoms. Please see discharge instructions. Patient expresses understanding.     Rodolph BongEvan S Maleik Vanderzee, MD 04/21/15 1500

## 2015-04-23 LAB — URINE CULTURE: Colony Count: 30000

## 2015-04-29 ENCOUNTER — Ambulatory Visit: Payer: Medicaid Other

## 2015-06-16 ENCOUNTER — Ambulatory Visit: Payer: Medicaid Other

## 2015-07-11 ENCOUNTER — Ambulatory Visit: Payer: Medicaid Other

## 2015-11-15 ENCOUNTER — Ambulatory Visit: Payer: Self-pay | Admitting: General Practice

## 2015-11-15 DIAGNOSIS — I10 Essential (primary) hypertension: Secondary | ICD-10-CM

## 2015-11-15 DIAGNOSIS — R300 Dysuria: Secondary | ICD-10-CM

## 2015-11-15 LAB — POCT URINALYSIS DIP (DEVICE)
Bilirubin Urine: NEGATIVE
Glucose, UA: NEGATIVE mg/dL
Ketones, ur: NEGATIVE mg/dL
Leukocytes, UA: NEGATIVE
Nitrite: NEGATIVE
Protein, ur: NEGATIVE mg/dL
Specific Gravity, Urine: 1.025 (ref 1.005–1.030)
Urobilinogen, UA: 0.2 mg/dL (ref 0.0–1.0)
pH: 5.5 (ref 5.0–8.0)

## 2015-11-15 NOTE — Addendum Note (Signed)
Addended by: Kathee DeltonHILLMAN, Andrya Roppolo L on: 11/15/2015 03:39 PM   Modules accepted: Orders

## 2015-11-15 NOTE — Progress Notes (Signed)
Patient here today for UTI check. Patient states she has had lower back pain, dysuria and difficulty urinating for past 2 weeks. Reviewed UA with Judeth HornErin Lawrence who stated no meds right now as UA does not indicate any but urine for culture. Informed patient that we will send her urine for culture to check for infection and will call her with results. Patient aware culture takes several days to result. Discussed with patient taking motrin in the mean time and to go to the ER if her pain becomes severe or develops a fever. Patient verbalized understanding to all & had no questions. Provided patient with information to [facility] for primary care

## 2015-11-16 ENCOUNTER — Ambulatory Visit: Payer: Medicaid Other

## 2015-11-17 ENCOUNTER — Telehealth: Payer: Self-pay | Admitting: General Practice

## 2015-11-17 ENCOUNTER — Telehealth: Payer: Self-pay | Admitting: Student

## 2015-11-17 ENCOUNTER — Other Ambulatory Visit: Payer: Self-pay | Admitting: Student

## 2015-11-17 DIAGNOSIS — N390 Urinary tract infection, site not specified: Principal | ICD-10-CM

## 2015-11-17 DIAGNOSIS — B9620 Unspecified Escherichia coli [E. coli] as the cause of diseases classified elsewhere: Secondary | ICD-10-CM

## 2015-11-17 LAB — URINE CULTURE: Colony Count: >=100000

## 2015-11-17 MED ORDER — PHENAZOPYRIDINE HCL 100 MG PO TABS: 100.0000 mg | ORAL_TABLET | Freq: Three times a day (TID) | ORAL | Status: DC | PRN

## 2015-11-17 MED ORDER — NITROFURANTOIN MONOHYD MACRO 100 MG PO CAPS: 100.0000 mg | ORAL_CAPSULE | Freq: Two times a day (BID) | ORAL | Status: DC

## 2015-11-17 NOTE — Telephone Encounter (Signed)
Urine culture shows UTI & antibiotic has been sent to pharmacy. Called patient, no answer- left message stating we are trying to reach you to inform you that your urine culture does show a UTI and we have sent an antibiotic to your pharmacy to treat it. If you have questions you may call us back at the clinics.

## 2015-11-17 NOTE — Telephone Encounter (Signed)
Called patient with results of urine culture. Verified by 2 patient identifiers.  Verified allergies & pharmacy.  All questions answered.   Judeth HornErin Shamica Moree, NP

## 2016-09-24 ENCOUNTER — Emergency Department (HOSPITAL_COMMUNITY): Payer: Self-pay

## 2016-09-24 ENCOUNTER — Emergency Department (HOSPITAL_COMMUNITY)
Admission: EM | Admit: 2016-09-24 | Discharge: 2016-09-24 | Disposition: A | Discharge status: Home / Self Care | Payer: Self-pay | Attending: Emergency Medicine | Admitting: Emergency Medicine

## 2016-09-24 ENCOUNTER — Encounter (HOSPITAL_COMMUNITY): Payer: Self-pay | Admitting: *Deleted

## 2016-09-24 DIAGNOSIS — I10 Essential (primary) hypertension: Secondary | ICD-10-CM | POA: Insufficient documentation

## 2016-09-24 DIAGNOSIS — R102 Pelvic and perineal pain: Secondary | ICD-10-CM | POA: Insufficient documentation

## 2016-09-24 DIAGNOSIS — R1030 Lower abdominal pain, unspecified: Secondary | ICD-10-CM

## 2016-09-24 LAB — COMPREHENSIVE METABOLIC PANEL
ALT: 90 U/L — ABNORMAL HIGH (ref 14–54)
AST: 53 U/L — ABNORMAL HIGH (ref 15–41)
Albumin: 4.3 g/dL (ref 3.5–5.0)
Alkaline Phosphatase: 73 U/L (ref 38–126)
Anion gap: 9 (ref 5–15)
BUN: 8 mg/dL (ref 6–20)
CO2: 23 mmol/L (ref 22–32)
Calcium: 9.3 mg/dL (ref 8.9–10.3)
Chloride: 109 mmol/L (ref 101–111)
Creatinine, Ser: 0.48 mg/dL (ref 0.44–1.00)
GFR calc Af Amer: >60 mL/min (ref >60)
GFR calc non Af Amer: >60 mL/min (ref >60)
Glucose, Bld: 91 mg/dL (ref 65–99)
Potassium: 3.9 mmol/L (ref 3.5–5.1)
Sodium: 141 mmol/L (ref 135–145)
Total Bilirubin: 0.7 mg/dL (ref 0.3–1.2)
Total Protein: 7.5 g/dL (ref 6.5–8.1)

## 2016-09-24 LAB — I-STAT BETA HCG BLOOD, ED (MC, WL, AP ONLY): I-stat hCG, quantitative: <5 m[IU]/mL (ref <5)

## 2016-09-24 LAB — URINE MICROSCOPIC-ADD ON

## 2016-09-24 LAB — CBC
HCT: 41.9 % (ref 36.0–46.0)
Hemoglobin: 15.1 g/dL — ABNORMAL HIGH (ref 12.0–15.0)
MCH: 31.7 pg (ref 26.0–34.0)
MCHC: 36 g/dL (ref 30.0–36.0)
MCV: 87.8 fL (ref 78.0–100.0)
Platelets: 316 10*3/uL (ref 150–400)
RBC: 4.77 MIL/uL (ref 3.87–5.11)
RDW: 13.2 % (ref 11.5–15.5)
WBC: 6.7 10*3/uL (ref 4.0–10.5)

## 2016-09-24 LAB — URINALYSIS, ROUTINE W REFLEX MICROSCOPIC
Bilirubin Urine: NEGATIVE
Glucose, UA: NEGATIVE mg/dL
Ketones, ur: NEGATIVE mg/dL
Nitrite: NEGATIVE
Protein, ur: NEGATIVE mg/dL
Specific Gravity, Urine: 1.026 (ref 1.005–1.030)
pH: 5.5 (ref 5.0–8.0)

## 2016-09-24 LAB — LIPASE, BLOOD: Lipase: 26 U/L (ref 11–51)

## 2016-09-24 MED ORDER — HYDROCORTISONE 1 % EX CREA: 1.0000 "application " | TOPICAL_CREAM | Freq: Two times a day (BID) | CUTANEOUS | 1 refills | Status: DC

## 2016-09-24 MED ORDER — IBUPROFEN 800 MG PO TABS: 800.0000 mg | ORAL_TABLET | Freq: Three times a day (TID) | ORAL | 0 refills | Status: DC

## 2016-09-24 MED ORDER — IBUPROFEN 400 MG PO TABS
400.0000 mg | ORAL_TABLET | Freq: Once | ORAL | Status: AC
  Administered 2016-09-24: 400 mg via ORAL
  Filled 2016-09-24: qty 1

## 2016-09-24 NOTE — ED Provider Notes (Signed)
[facility]-EMERGENCY DEPT Provider Note   CSN: [id number] Arrival date & time: 09/24/16  1107     History   Chief Complaint Chief Complaint  Patient presents with  . Abdominal Pain   HPI   Blood pressure 115/88, pulse 80, temperature 97.6 F (36.4 C), temperature source Oral, resp. rate 18, last menstrual period 07/21/2016, SpO2 100 %, currently breastfeeding.  Jennifer Ezekiel SlocumbGuadalupe Holmes is a 30 y.o. female complaining of dysuria, vaginal itching, perineal rash and Midline lower abdominal pain not alleviated with ibuprofen worsening over the course of several weeks. [place] [place]. She was seen 1 week ago and treated for PID with Rocephin, doxycycline which she's been compliant with. I've obtained records which shows a negative GC, Chlamydia, wet prep, trichomonas. She saw them again this morning and was advised to come to the ED for further evaluation. She is single episode of nonbloody, bilious, coffee-ground emesis yesterday. She denies diarrhea, fever, chills, flank pain. She has eaten this morning without issue. She is postpartum 8716 month, she states that she had multiple infections when she was pregnant she continued to have them afterwards, [facility] but states she got frustrated and was just getting over-the-counter Monistat medication. She does not have a primary care doctor.  Past Medical History:  Diagnosis Date  . Blood transfusion without reported diagnosis   . GERD (gastroesophageal reflux disease)   . Headache   . Hypertension   . PONV (postoperative nausea and vomiting)     Patient Active Problem List   Diagnosis Date Noted  . Hypertension, benign 03/10/2015  . HELLP syndrome 01/27/2015  . Elevated LFTs 09/12/2014  . History of fatty infiltration of liver 09/12/2014    Past Surgical History:  Procedure Laterality Date  . CESAREAN SECTION      OB History    Gravida Para Term Preterm AB Living   4 3 1 1  0 2   SAB TAB Ectopic  Multiple Live Births   0 0 0 0 2      Obstetric Comments   Had a blood transfusion with first pregnancy [place]       Home Medications    Prior to Admission medications   Medication Sig Start Date End Date Taking? Authorizing Provider  calcium carbonate (TUMS - DOSED IN MG ELEMENTAL CALCIUM) 500 MG chewable tablet Chew 2 tablets by mouth daily as needed for indigestion or heartburn.     Historical Provider, MD  flintstones complete (FLINTSTONES) 60 MG chewable tablet Chew 2 tablets by mouth daily.    Historical Provider, MD  fluconazole (DIFLUCAN) 150 MG tablet Take 1 tablet (150 mg total) by mouth once. 03/15/15   Deirdre Colin Mulders Poe, CNM  hydrocortisone cream 1 % Apply 1 application topically 2 (two) times daily. Do not apply to face 09/24/16   [facility] LLCNicole Emir Nack, PA-C  ibuprofen (ADVIL,MOTRIN) 800 MG tablet Take 1 tablet (800 mg total) by mouth 3 (three) times daily. 09/24/16   Chesky Heyer, PA-C  methyldopa (ALDOMET) 250 MG tablet Take 250 mg by mouth 2 (two) times daily.    Historical Provider, MD  nitrofurantoin, macrocrystal-monohydrate, (MACROBID) 100 MG capsule Take 1 capsule (100 mg total) by mouth 2 (two) times daily. 11/17/15   Judeth HornErin Lawrence, NP  omeprazole (PRILOSEC) 40 MG capsule Take 1 capsule (40 mg total) by mouth daily. 01/11/15   Wilmer FloorLisa A Leftwich-Kirby, CNM  phenazopyridine (PYRIDIUM) 100 MG tablet Take 1 tablet (100 mg total)  by mouth 3 (three) times daily as needed for pain. 11/17/15   Judeth Horn, NP    Family History Family History  Problem Relation Age of Onset  . Hypertension Mother   . Heart disease Mother   . Hypertension Brother     Social History Social History  Substance Use Topics  . Smoking status: Never Smoker  . Smokeless tobacco: Never Used  . Alcohol use No     Allergies   Lactose intolerance (gi)   Review of Systems Review of Systems  10 systems reviewed and found to be negative, except as noted in the HPI.   Physical  Exam Updated Vital Signs BP 126/80 (BP Location: Left Arm)   Pulse 78   Temp 98.9 F (37.2 C) (Oral)   Resp 16   LMP 07/21/2016   SpO2 100%   Physical Exam  Constitutional: She is oriented to person, place, and time. She appears well-developed and well-nourished. No distress.  HENT:  Head: Normocephalic and atraumatic.  Mouth/Throat: Oropharynx is clear and moist.  Eyes: Conjunctivae and EOM are normal. Pupils are equal, round, and reactive to light.  Neck: Normal range of motion.  Cardiovascular: Normal rate, regular rhythm and intact distal pulses.   Pulmonary/Chest: Effort normal and breath sounds normal.  Abdominal: Soft. She exhibits no distension and no mass. There is no tenderness. There is no rebound and no guarding. No hernia.  Genitourinary:  Genitourinary Comments: Mild suprapubic tenderness with no guarding or rebound, Rovsing negative, so as negative, obturator negative, external vaginal exam with no rashes, patient is menstruating.  Musculoskeletal: Normal range of motion.  Neurological: She is alert and oriented to person, place, and time.  Skin: She is not diaphoretic.  Psychiatric: She has a normal mood and affect.  Nursing note and vitals reviewed.    ED Treatments / Results  Labs (all labs ordered are listed, but only abnormal results are displayed) Labs Reviewed  COMPREHENSIVE METABOLIC PANEL - Abnormal; Notable for the following:       Result Value   AST 53 (*)    ALT 90 (*)    All other components within normal limits  CBC - Abnormal; Notable for the following:    Hemoglobin 15.1 (*)    All other components within normal limits  URINALYSIS, ROUTINE W REFLEX MICROSCOPIC (NOT AT [facility]) - Abnormal; Notable for the following:    APPearance HAZY (*)    Hgb urine dipstick MODERATE (*)    Leukocytes, UA TRACE (*)    All other components within normal limits  URINE MICROSCOPIC-ADD ON - Abnormal; Notable for the following:    Squamous Epithelial / LPF 6-30  (*)    Bacteria, UA RARE (*)    Crystals CA OXALATE CRYSTALS (*)    All other components within normal limits  WET PREP, GENITAL  LIPASE, BLOOD  RPR  HIV ANTIBODY (ROUTINE TESTING)  I-STAT BETA HCG BLOOD, ED ([facility], [facility], [facility] ONLY)  GC/CHLAMYDIA PROBE AMP ([place]) NOT AT [facility]    EKG  EKG Interpretation None       Radiology US Transvaginal Non-ob  Result Date: 09/24/2016 CLINICAL DATA:  Pelvic pain for 2 weeks. History of Caesarian section. EXAM: TRANSABDOMINAL ULTRASOUND OF PELVIS TECHNIQUE: Transabdominal ultrasound examination of the pelvis was performed including evaluation of the uterus, ovaries, adnexal regions, and pelvic cul-de-sac. COMPARISON:  Previous exams from 2015 were pregnancy related ultrasound studies FINDINGS: Uterus Measurements: 9.5 x 5 x 6.8 cm. No fibroids or other mass visualized.  Nabothian cyst noted at the cervix measuring 0.5 x 0.3 x 0.5 cm. More Endometrium Thickness: 1.1 mm.  Normal appearance. Right ovary Measurements: 3.3 x 1.8 x 2.1 cm . Normal appearance/no adnexal mass. Left ovary Measurements: The left ovary was obscured by bowel gas and not visualized. Other findings:  No abnormal free fluid. IMPRESSION: No sonographic findings for the patient's pain. Left ovary was not visualized due to overlying bowel gas. Electronically Signed   By: Tollie Eth M.D.   On: 09/24/2016 17:36   US Pelvis Complete  Result Date: 09/24/2016 CLINICAL DATA:  Pelvic pain for 2 weeks. History of Caesarian section. EXAM: TRANSABDOMINAL ULTRASOUND OF PELVIS TECHNIQUE: Transabdominal ultrasound examination of the pelvis was performed including evaluation of the uterus, ovaries, adnexal regions, and pelvic cul-de-sac. COMPARISON:  Previous exams from 2015 were pregnancy related ultrasound studies FINDINGS: Uterus Measurements: 9.5 x 5 x 6.8 cm. No fibroids or other mass visualized. Nabothian cyst noted at the cervix measuring 0.5 x 0.3 x 0.5 cm. More Endometrium Thickness: 1.1 mm.   Normal appearance. Right ovary Measurements: 3.3 x 1.8 x 2.1 cm . Normal appearance/no adnexal mass. Left ovary Measurements: The left ovary was obscured by bowel gas and not visualized. Other findings:  No abnormal free fluid. IMPRESSION: No sonographic findings for the patient's pain. Left ovary was not visualized due to overlying bowel gas. Electronically Signed   By: Tollie Eth M.D.   On: 09/24/2016 17:36    Procedures Procedures (including critical care time)  Medications Ordered in ED Medications  ibuprofen (ADVIL,MOTRIN) tablet 400 mg (400 mg Oral Given 09/24/16 1745)     Initial Impression / Assessment and Plan / ED Course  I have reviewed the triage vital signs and the nursing notes.  Pertinent labs & imaging results that were available during my care of the patient were reviewed by me and considered in my medical decision making (see chart for details).  Clinical Course   Vitals:   09/24/16 1113 09/24/16 1512 09/24/16 1745 09/24/16 2013  BP: 136/95 115/88 126/83 126/80  Pulse: 84 80 73 78  Resp: 18 18 16 16   Temp: 97.4 F (36.3 C) 97.6 F (36.4 C) 98.3 F (36.8 C) 98.9 F (37.2 C)  TempSrc: Oral Oral Oral Oral  SpO2: 98% 100% 98% 100%    Medications  ibuprofen (ADVIL,MOTRIN) tablet 400 mg (400 mg Oral Given 09/24/16 1745)    Jacey Erlean Mealor is 30 y.o. female presenting with Worsening abdominal pain, this seems to be chronic in nature she states that she has had multiple courses of antibiotics for unknown infection in the pelvic region over the course of the last 16 months, she is also reporting anginal itching, she reports rash but I do not see one on my exam. Abdominal exam is nonsurgical, I doubt this is appendicitis given her good appetite, lack of fever and the chronic nature of her pain. She had a negative pelvic exam one week ago and was being treated for a clinical PID, will obtain ultrasound to evaluate for TOA however think this is highly unlikely, she  is nontoxic appearing.I don't think it would be beneficial to repeat the pelvic exam today.  Ultrasound, blood work and urinalysis with no significant abnormalities, she does have an elevation in her LFTs however this is chronic and improved from her baseline. She did just start her period, states she hasn't demonstrated in 2 months. Think this is possible that this is severe menstrual cramping causing her discomfort. Recommend  high-dose ibuprofen.  Evaluation does not show pathology that would require ongoing emergent intervention or inpatient treatment. Pt is hemodynamically stable and mentating appropriately. Discussed findings and plan with patient/guardian, who agrees with care plan. All questions answered. Return precautions discussed and outpatient follow up given.      Final Clinical Impressions(s) / ED Diagnoses   Final diagnoses:  Pelvic pain  Lower abdominal pain    New Prescriptions Discharge Medication List as of 09/24/2016  8:00 PM    START taking these medications   Details  hydrocortisone cream 1 % Apply 1 application topically 2 (two) times daily. Do not apply to face, Starting Mon 09/24/2016, Print    ibuprofen (ADVIL,MOTRIN) 800 MG tablet Take 1 tablet (800 mg total) by mouth 3 (three) times daily., Starting Mon 09/24/2016, [employer], PA-C 09/24/16 1914    Benjiman Core, MD 09/25/16 [phone]

## 2016-09-24 NOTE — ED Triage Notes (Signed)
Pt is here with lower abdominal pain for several days and today worse.

## 2016-09-24 NOTE — Discharge Instructions (Signed)
Do not hesitate to return to the emergency room for any new, worsening or concerning symptoms. ° °Please obtain primary care using resource guide below. Let them know that you were seen in the emergency room and that they will need to obtain records for further outpatient management. ° ° °

## 2016-09-25 LAB — RPR: RPR Ser Ql: NONREACTIVE

## 2016-09-25 LAB — HIV ANTIBODY (ROUTINE TESTING W REFLEX): HIV Screen 4th Generation wRfx: NONREACTIVE

## 2016-10-02 IMAGING — US US OB FOLLOW-UP
1 series · 12 of 28 positions shown · non-contrast
Comparison: none

[Series 1: us ob follow up · 12 of 75 slices shown]
[im 3/75]
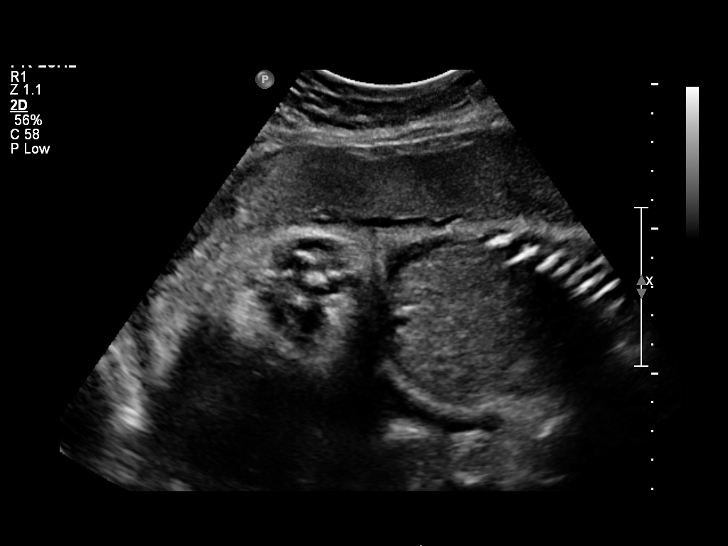
[im 9/75]
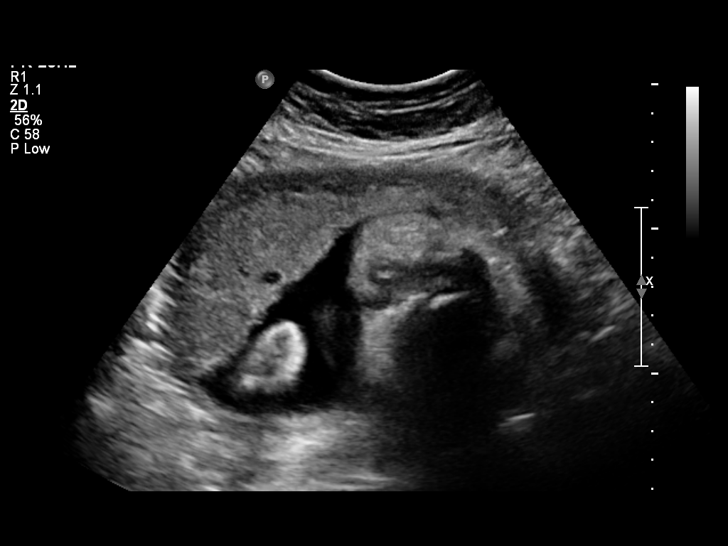
[im 14/75]
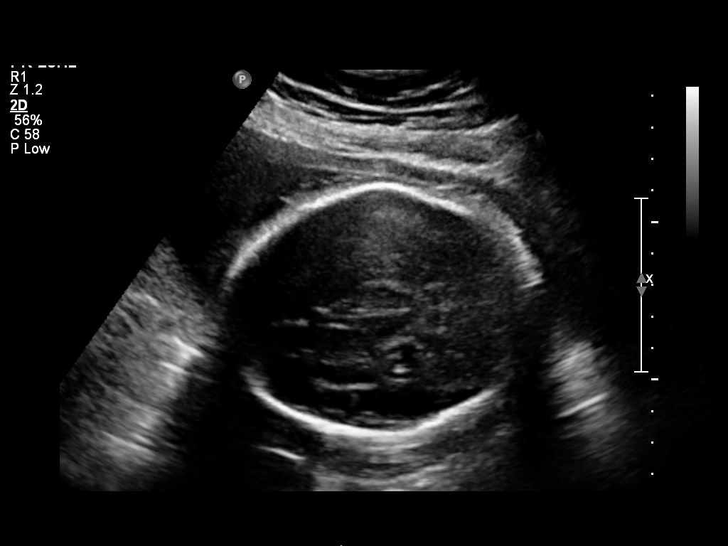
[im 22/75]
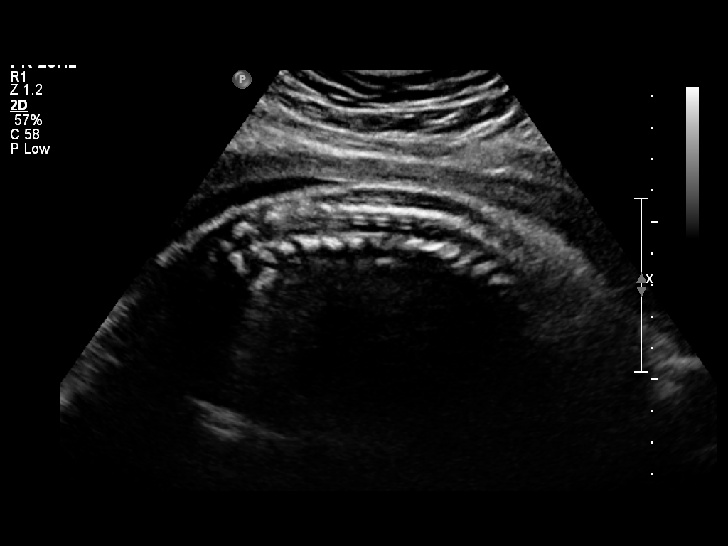
[im 28/75]
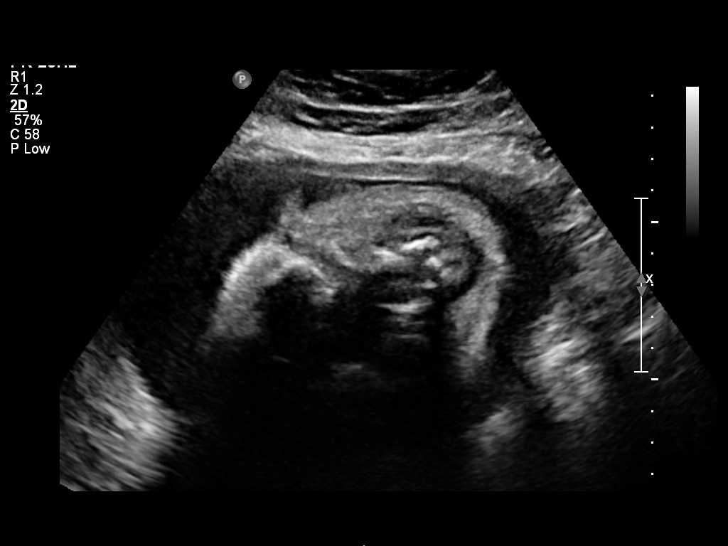
[im 33/75]
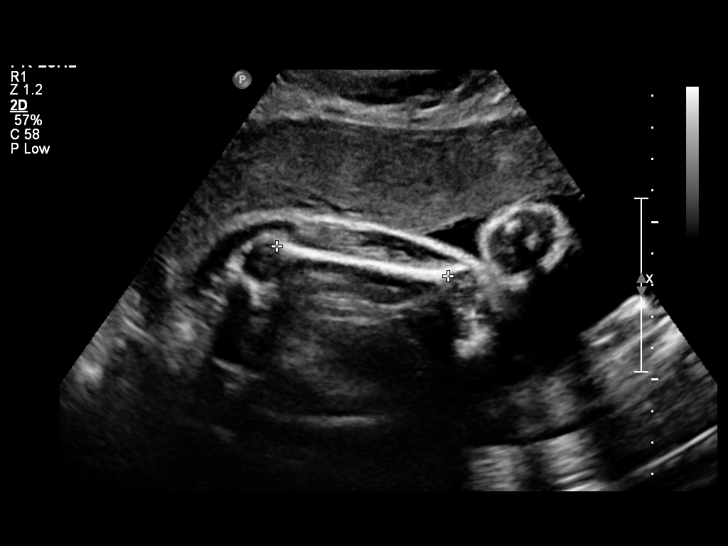
[im 42/75]
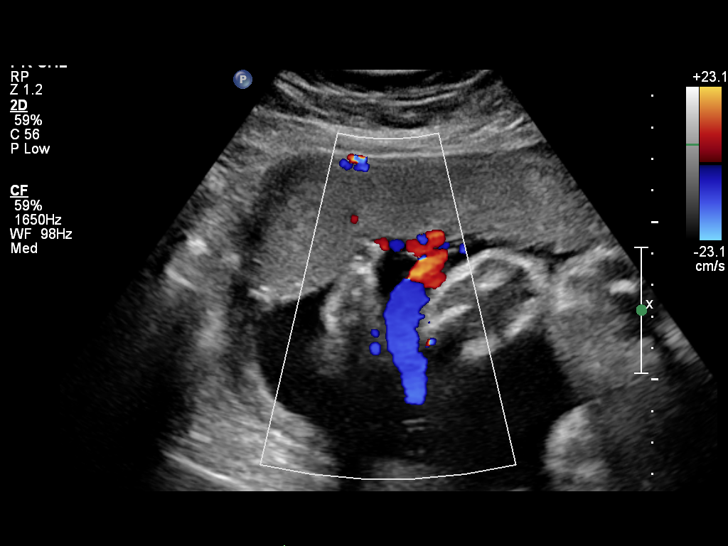
[im 47/75]
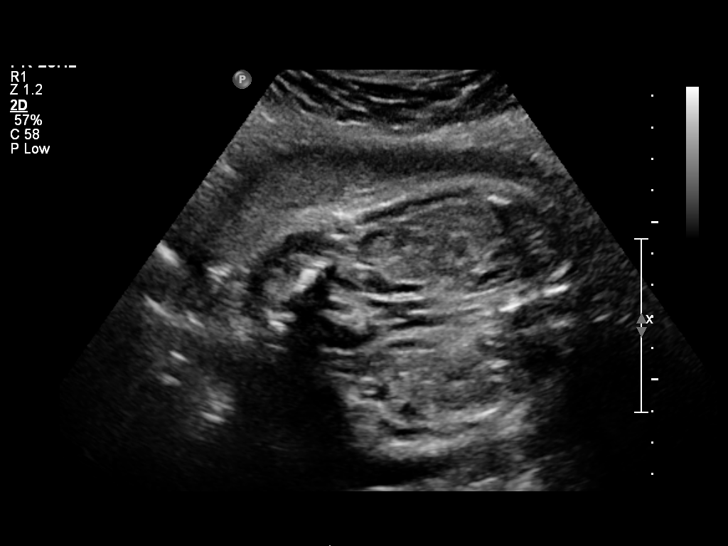
[im 53/75]
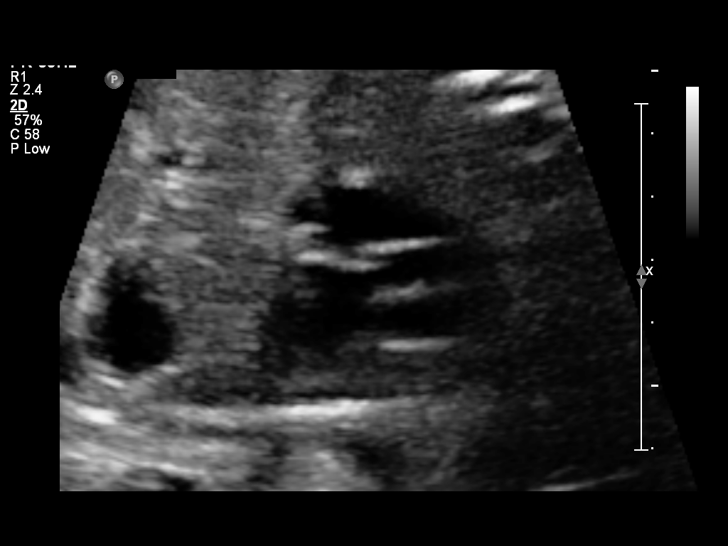
[im 61/75]
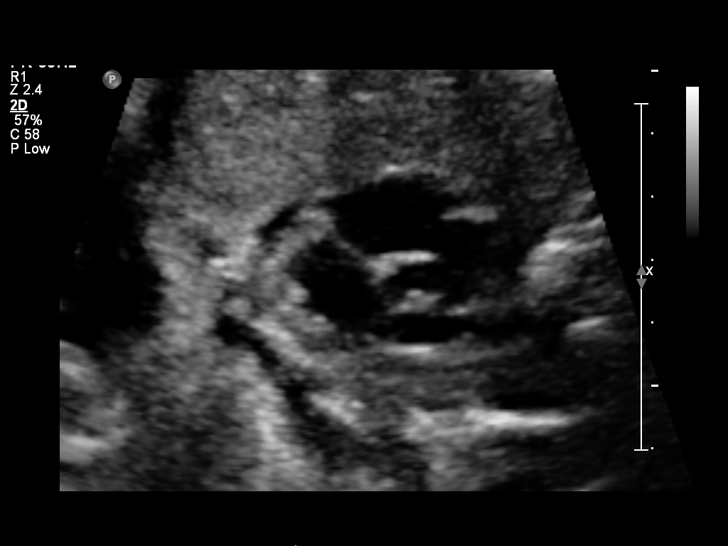
[im 66/75]
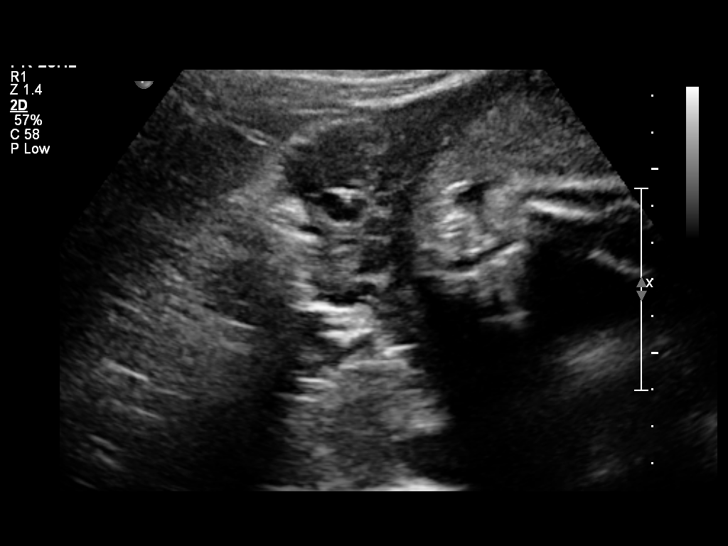
[im 72/75]
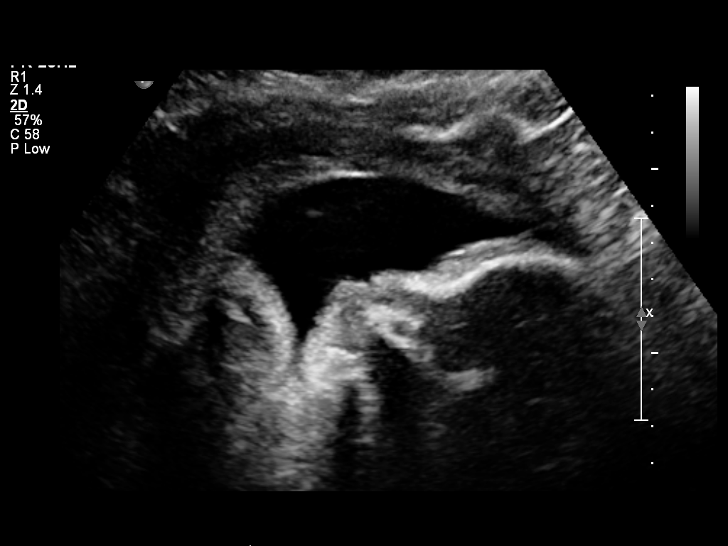

[12 of 28 positions shown; findings below may reference images not displayed]

OBSTETRICS REPORT
                      (Signed Final 12/20/2014 [DATE])

Service(s) Provided

 US OB FOLLOW UP                                       76816.1
Indications

 Hypertension - Chronic/Pre-existing
 Previous cesarean section
 28 weeks gestation of pregnancy
Fetal Evaluation

 Num Of Fetuses:    1
 Fetal Heart Rate:  128                          bpm
 Cardiac Activity:  Observed
 Presentation:      Cephalic
 Placenta:          Anterior, above cervical os
 P. Cord            Visualized
 Insertion:

 Amniotic Fluid
 AFI FV:      Subjectively within normal limits
 AFI Sum:     16.34   cm       59  %Tile     Larg Pckt:    6.14  cm
 RUQ:   6.14    cm   RLQ:    3.85   cm    LUQ:   4.73    cm   LLQ:    1.62   cm
Biometry

 BPD:     76.6  mm     G. Age:  30w 5d                CI:        74.68   70 - 86
                                                      FL/HC:      19.8   19.2 -

 HC:     281.3  mm     G. Age:  30w 6d       34  %    HC/AC:      1.02   0.99 -

 AC:     276.7  mm     G. Age:  31w 5d       86  %    FL/BPD:     72.6   71 - 87
 FL:      55.6  mm     G. Age:  29w 2d       16  %    FL/AC:      20.1   20 - 24
 HUM:     52.5  mm     G. Age:  30w 4d       61  %
 CER:     33.9  mm     G. Age:  29w 3d       36  %

 Est. FW:    3058  gm    3 lb 10 oz      66  %
Gestational Age

 LMP:           31w 3d        Date:  05/14/14                 EDD:   02/18/15
 U/S Today:     30w 4d                                        EDD:   02/24/15
Anatomy
 Cranium:          Appears normal         Aortic Arch:      Appears normal
 Fetal Cavum:      Appears normal         Ductal Arch:      Appears normal
 Ventricles:       Appears normal         Diaphragm:        Appears normal
 Choroid Plexus:   Previously seen        Stomach:          Appears normal, left
                                                            sided
 Cerebellum:       Appears normal         Abdomen:          Appears normal
 Posterior Fossa:  Previously seen        Abdominal Wall:   Appears nml (cord
                                                            insert, abd wall)
 Nuchal Fold:      Not applicable (>20    Cord Vessels:     Appears normal (3
                   wks GA)                                  vessel cord)
 Face:             Appears normal         Kidneys:          Appear normal
                   (orbits and profile)
 Lips:             Appears normal         Bladder:          Appears normal
 Heart:            Appears normal         Spine:            Appears normal
                   (4CH, axis, and
                   situs)
 RVOT:             Appears normal         Lower             Previously seen
                                          Extremities:
 LVOT:             Appears normal         Upper             Previously seen
                                          Extremities:

 Other:  Fetus appears to be a female. Heels previously visualized. Left 5th
         digit previously visualized.
Cervix Uterus Adnexa

 Cervix:       Not visualized (advanced GA >00wks)

 Left Ovary:    Within normal limits.
 Right Ovary:   Within normal limits.
Impression

 Single IUP at 30w 1d
 CHTN
 Normal interval growth (66th %tile)
 Anterior placenta without previa
 Normal amniotic fluid volume
Recommendations

 Recommend follow-up ultrasound examination in 4 weeks for
 growth
 Antenatal testing beginning at 32 weeks.

## 2016-12-10 NOTE — L&D Delivery Note (Signed)
Patient is 31 y.o. [id number] 867w6d admitted for PTL.   Prenatal course also complicated by cHTN.  Delivery Note At 8:55 AM a viable female was delivered via VBAC by RN  (Presentation:Cephalic ).  APGAR: 8, 8; weight: 4lb 3.7oz.   Placenta status: intact, delivered spontaneously (by RN) and sent to pathology.  Cord: 3v with no complications.  Anesthesia:  None Episiotomy:  None Lacerations:  None Est. Blood Loss (mL):  200  Mom to postpartum.  Baby to NICU.  Denyse AmassCorey P Cox 07/19/2017, 9:17 AM   Upon arrival patient was complete baby was in incubator. Per report from nursing, She pushed with good maternal effort to deliver a viable female infant in cephalic position. No nuchal cord present Baby delivered without difficulty , was noted to have good tone and handed to NICU team for further assessment. Placenta delivered spontaneously with gentle cord traction. Fundus firm with massage and Pitocin. Perineum inspected and found to have no laceration,Counts of sharps, instruments, and lap pads were all correct.  Patient is a [id number] at 5567w6d who was admitted on 8/8 with vag bldg which progressed to PTL early this AM and [facility]. Significant hx of prev C/S, HELLP, PPH and cHTN.  She progressed without augmentation.  The resident and I were both called to the delivery, but by the time we walked in the room the baby was in the warmer and the placenta was being delivered by RN.  Complications: pt rec'd rectal cytotec for some large clots delivered shortly after providers left the room  Lacerations: none  EBL: 200cc  Cam HaiSHAW, Chassie Pennix, CNM 10:49 PM 07/19/2017

## 2017-02-21 ENCOUNTER — Encounter: Payer: Self-pay | Admitting: Emergency Medicine

## 2017-02-21 ENCOUNTER — Emergency Department (HOSPITAL_COMMUNITY)
Admission: EM | Admit: 2017-02-21 | Discharge: 2017-02-21 | Disposition: A | Discharge status: Home / Self Care | Payer: Self-pay | Attending: Emergency Medicine | Admitting: Emergency Medicine

## 2017-02-21 ENCOUNTER — Emergency Department (HOSPITAL_COMMUNITY): Payer: Self-pay

## 2017-02-21 DIAGNOSIS — R079 Chest pain, unspecified: Secondary | ICD-10-CM | POA: Insufficient documentation

## 2017-02-21 DIAGNOSIS — I10 Essential (primary) hypertension: Secondary | ICD-10-CM | POA: Insufficient documentation

## 2017-02-21 DIAGNOSIS — O9989 Other specified diseases and conditions complicating pregnancy, childbirth and the puerperium: Secondary | ICD-10-CM | POA: Insufficient documentation

## 2017-02-21 DIAGNOSIS — Z3A10 10 weeks gestation of pregnancy: Secondary | ICD-10-CM | POA: Insufficient documentation

## 2017-02-21 LAB — HEPATIC FUNCTION PANEL
ALT: 62 U/L — ABNORMAL HIGH (ref 14–54)
AST: 50 U/L — ABNORMAL HIGH (ref 15–41)
Albumin: 3.9 g/dL (ref 3.5–5.0)
Alkaline Phosphatase: 64 U/L (ref 38–126)
Bilirubin, Direct: 0.1 mg/dL (ref 0.1–0.5)
Indirect Bilirubin: 0.4 mg/dL (ref 0.3–0.9)
Total Bilirubin: 0.5 mg/dL (ref 0.3–1.2)
Total Protein: 7.3 g/dL (ref 6.5–8.1)

## 2017-02-21 LAB — BASIC METABOLIC PANEL
Anion gap: 9 (ref 5–15)
BUN: 7 mg/dL (ref 6–20)
CO2: 18 mmol/L — ABNORMAL LOW (ref 22–32)
Calcium: 9 mg/dL (ref 8.9–10.3)
Chloride: 110 mmol/L (ref 101–111)
Creatinine, Ser: 0.47 mg/dL (ref 0.44–1.00)
GFR calc Af Amer: >60 mL/min (ref >60)
GFR calc non Af Amer: >60 mL/min (ref >60)
Glucose, Bld: 143 mg/dL — ABNORMAL HIGH (ref 65–99)
Potassium: 3.2 mmol/L — ABNORMAL LOW (ref 3.5–5.1)
Sodium: 137 mmol/L (ref 135–145)

## 2017-02-21 LAB — CBC
HCT: 40.7 % (ref 36.0–46.0)
Hemoglobin: 14.7 g/dL (ref 12.0–15.0)
MCH: 31.5 pg (ref 26.0–34.0)
MCHC: 36.1 g/dL — ABNORMAL HIGH (ref 30.0–36.0)
MCV: 87.3 fL (ref 78.0–100.0)
Platelets: 324 10*3/uL (ref 150–400)
RBC: 4.66 MIL/uL (ref 3.87–5.11)
RDW: 13.9 % (ref 11.5–15.5)
WBC: 8.6 10*3/uL (ref 4.0–10.5)

## 2017-02-21 LAB — URINALYSIS, ROUTINE W REFLEX MICROSCOPIC
Bilirubin Urine: NEGATIVE
Glucose, UA: NEGATIVE mg/dL
Hgb urine dipstick: NEGATIVE
Ketones, ur: NEGATIVE mg/dL
Leukocytes, UA: NEGATIVE
Nitrite: NEGATIVE
Protein, ur: NEGATIVE mg/dL
Specific Gravity, Urine: 1.003 — ABNORMAL LOW (ref 1.005–1.030)
pH: 6 (ref 5.0–8.0)

## 2017-02-21 LAB — I-STAT TROPONIN, ED
Troponin i, poc: 0 ng/mL (ref 0.00–0.08)
Troponin i, poc: 0 ng/mL (ref 0.00–0.08)

## 2017-02-21 LAB — I-STAT BETA HCG BLOOD, ED (MC, WL, AP ONLY): I-stat hCG, quantitative: >2000 m[IU]/mL — ABNORMAL HIGH (ref <5)

## 2017-02-21 MED ORDER — METHYLDOPATE HCL 250 MG/5ML IV SOLN
250.0000 mg | Freq: Once | INTRAVENOUS | Status: DC
  Filled 2017-02-21: qty 5

## 2017-02-21 MED ORDER — METHYLDOPA 500 MG PO TABS
500.0000 mg | ORAL_TABLET | Freq: Once | ORAL | Status: DC
  Filled 2017-02-21: qty 1

## 2017-02-21 MED ORDER — METOCLOPRAMIDE HCL 5 MG/ML IJ SOLN
10.0000 mg | Freq: Once | INTRAMUSCULAR | Status: AC
  Administered 2017-02-21: 10 mg via INTRAVENOUS
  Filled 2017-02-21: qty 2

## 2017-02-21 MED ORDER — SODIUM CHLORIDE 0.9 % IV BOLUS (SEPSIS)
1000.0000 mL | Freq: Once | INTRAVENOUS | Status: AC
  Administered 2017-02-21: 1000 mL via INTRAVENOUS

## 2017-02-21 NOTE — ED Triage Notes (Signed)
Pt arrives via POV for shortness of breath, diaphoresis and palpitations starting earlier this evening. Pt speaking in broken sentences, pausing to inhale between words. Diaphoretic in triage, soaked her shirt. States she didn't feel right this afternoon, noted elevated BP but felt comfortable with going to work. She states while at work s/s became worse. Pt reports hx hypertension but has been off of her meds for 3 months, states she hasn't been able to get a doctor. States [redacted] weeks pregnant, as not seen OBGYN yet either.

## 2017-02-21 NOTE — ED Notes (Signed)
The pt was at work and became upset  The pt is c/o sob   She is hyperventilating.. She is [redacted] weeks pregnant.. She is also nauseated  She has these episodes opf this  She has not seen an ob doctor  She has an appointment next week

## 2017-02-21 NOTE — ED Notes (Signed)
Patient left at this time with all belongings. Cardama MD went over discharge instructions with patient in Spanish. Provided number for [facility] outpatient clinic.

## 2017-02-21 NOTE — ED Notes (Signed)
nss infused pt needs another blood test before she leaves

## 2017-02-21 NOTE — ED Provider Notes (Signed)
[facility]-EMERGENCY DEPT Provider Note   CSN: [id number] Arrival date & time: 02/21/17  0003  By signing my name below, I, Arianna Nassar, attest that this documentation has been prepared under the direction and in the presence of Nira Conn, MD.  Electronically Signed: Octavia Heir, ED Scribe. 02/21/17. 12:50 AM.    History   Chief Complaint Chief Complaint  Patient presents with  . Shortness of Breath  . Hypertension  . Tachycardia   The history is provided by the patient and the EMS personnel. No language interpreter was used.   HPI Comments: Jennifer Holmes is a 31 y.o. female brought in by ambulance, who presents to the Emergency Department complaining of sudden onset, moderate, pressure-like substernal chest pain that began ~ 1 hour ago. Pt expresses that her chest pain is non-radiating, non-exertional, and has been constant since initial onset. She reports associated nausea, vomiting, chills, shortness of breath, headache, and mild light-headedness. There are no modifying or aggravating factors noted. She states she has not taken methyldopa for the past 3 months.  Pt is currently ~ [redacted] weeks pregnant. Her last normal menstrual period was around the end of December to early January. This is pt's 3rd pregnancy, stating that she has given birth twice. She has no hx of eclampsia or preeclampsia with her prior pregnancies. Pt further has no hx of DVT in her legs or lungs, as well as hx of PE.She denies fever, cough, diarrhea, dysuria, abdominal pain, or vaginal bleeding.  Past Medical History:  Diagnosis Date  . Blood transfusion without reported diagnosis   . GERD (gastroesophageal reflux disease)   . Headache   . Hypertension   . PONV (postoperative nausea and vomiting)     Patient Active Problem List   Diagnosis Date Noted  . Hypertension, benign 03/10/2015  . HELLP syndrome 01/27/2015  . Elevated LFTs 09/12/2014  . History of fatty infiltration of liver  09/12/2014    Past Surgical History:  Procedure Laterality Date  . CESAREAN SECTION      OB History    Gravida Para Term Preterm AB Living   5 3 1 1  0 2   SAB TAB Ectopic Multiple Live Births   0 0 0 0 2      Obstetric Comments   Had a blood transfusion with first pregnancy after her C/S in [place].       Home Medications    Prior to Admission medications   Medication Sig Start Date End Date Taking? Authorizing Provider  calcium carbonate (TUMS - DOSED IN MG ELEMENTAL CALCIUM) 500 MG chewable tablet Chew 2 tablets by mouth daily as needed for indigestion or heartburn.     Historical Provider, MD  flintstones complete (FLINTSTONES) 60 MG chewable tablet Chew 2 tablets by mouth daily.    Historical Provider, MD  fluconazole (DIFLUCAN) 150 MG tablet Take 1 tablet (150 mg total) by mouth once. 03/15/15   Deirdre Colin Mulders, CNM  hydrocortisone cream 1 % Apply 1 application topically 2 (two) times daily. Do not apply to face 09/24/16   [facility], PA-C  ibuprofen (ADVIL,MOTRIN) 800 MG tablet Take 1 tablet (800 mg total) by mouth 3 (three) times daily. 09/24/16   Nicole Pisciotta, PA-C  methyldopa (ALDOMET) 250 MG tablet Take 250 mg by mouth 2 (two) times daily.    Historical Provider, MD  nitrofurantoin, macrocrystal-monohydrate, (MACROBID) 100 MG capsule Take 1 capsule (100 mg total) by mouth 2 (two) times daily. 11/17/15   Judeth Horn,  NP  omeprazole (PRILOSEC) 40 MG capsule Take 1 capsule (40 mg total) by mouth daily. 01/11/15   Wilmer FloorLisa A Leftwich-Kirby, CNM  phenazopyridine (PYRIDIUM) 100 MG tablet Take 1 tablet (100 mg total) by mouth 3 (three) times daily as needed for pain. 11/17/15   Judeth HornErin Lawrence, NP    Family History Family History  Problem Relation Age of Onset  . Hypertension Mother   . Heart disease Mother   . Hypertension Brother     Social History Social History  Substance Use Topics  . Smoking status: Never Smoker  . Smokeless tobacco: Never Used  . Alcohol use  No     Allergies   Lactose intolerance (gi)   Review of Systems Review of Systems  A complete 10 system review of systems was obtained and all systems are negative except as noted in the HPI and PMH.   Physical Exam Updated Vital Signs BP (!) 207/162 (BP Location: Right Arm)   Pulse (!) 125   Temp 97.9 F (36.6 C) (Oral)   Resp 22   LMP 07/21/2016   SpO2 99%   Vitals:   02/21/17 0230 02/21/17 0300 02/21/17 0330 02/21/17 0400  BP: 108/74 115/82 103/59 112/93  Pulse: 69 78 66 69  Resp: 14 14 19 15   Temp:      TempSrc:      SpO2: 100% 100% 99% 99%    Physical Exam  Constitutional: She is oriented to person, place, and time. She appears well-developed and well-nourished. No distress.  HENT:  Head: Normocephalic and atraumatic.  Nose: Nose normal.  Eyes: Conjunctivae and EOM are normal. Pupils are equal, round, and reactive to light. Right eye exhibits no discharge. Left eye exhibits no discharge. No scleral icterus.  Neck: Normal range of motion. Neck supple.  Cardiovascular: Normal rate and regular rhythm.  Exam reveals no gallop and no friction rub.   No murmur heard. Pulmonary/Chest: Effort normal and breath sounds normal. No stridor. No respiratory distress. She has no rales.  Abdominal: Soft. She exhibits no distension. There is no tenderness.  Musculoskeletal: She exhibits no edema or tenderness.  No bilateral lower extremity edema  Neurological: She is alert and oriented to person, place, and time.  Skin: Skin is warm and dry. No rash noted. She is not diaphoretic. No erythema.  Psychiatric: She has a normal mood and affect.  Vitals reviewed.    ED Treatments / Results  DIAGNOSTIC STUDIES: Oxygen Saturation is 99% on RA, normal by my interpretation.  COORDINATION OF CARE:  12:47 AM Discussed treatment plan with pt at bedside and pt agreed to plan.  Labs (all labs ordered are listed, but only abnormal results are displayed) Labs Reviewed  BASIC  METABOLIC PANEL  CBC  I-STAT TROPOININ, ED  I-STAT BETA HCG BLOOD, ED ([facility], [facility], [facility] ONLY)  POC URINE PREG, ED    EKG  EKG Interpretation  Date/Time:  Thursday February 21 2017 00:10:49 EDT Ventricular Rate:  126 PR Interval:  160 QRS Duration: 74 QT Interval:  292 QTC Calculation: 422 R Axis:   48 Text Interpretation:  Sinus tachycardia Otherwise normal ECG No significant change since last tracing Confirmed by [facility] HospitalCARDAMA MD, PEDRO ([id number]) on 02/21/2017 12:24:36 AM       Radiology No results found.  Procedures Procedures (including critical care time)  Medications Ordered in ED Medications - No data to display   Initial Impression / Assessment and Plan / ED Course  I have reviewed the triage vital signs and  the nursing notes.  Pertinent labs & imaging results that were available during my care of the patient were reviewed by me and considered in my medical decision making (see chart for details).     Atypical chest pain inconsistent with ACS. Most likely secondary to hypertension. EKG without acute ischemic changes or evidence of pericarditis. Labs without evidence of end organ damage. Patient's blood pressure and symptomatology resolved spontaneously without intervention other than IV fluids and antiemetics. Patient was monitored for several hours without recurrence of his symptoms. Did rule out ACS with delta troponin. No suspicion for pulmonary embolism. Presentation not classic for aortic dissection or esophageal perforation.  Patient is not complaining of any abdominal pain or vaginal bleeding that would be concerning for ectopic pregnancy.  Safe for discharge with strict return precautions.  Final Clinical Impressions(s) / ED Diagnoses   Final diagnoses:  Nonspecific chest pain  Hypertension, unspecified type  [redacted] weeks gestation of pregnancy   Disposition: Discharge  Condition: Good  I have discussed the results, Dx and Tx plan with the patient who expressed  understanding and agree(s) with the plan. Discharge instructions discussed at great length. The patient was given strict return precautions who verbalized understanding of the instructions. No further questions at time of discharge.    Discharge Medication List as of 02/21/2017  5:10 AM      Follow Up: ob/gyn  Schedule an appointment as soon as possible for a visit  For close follow-up for pregnancy complicated by periodic hypertension   I personally performed the services described in this documentation, which was scribed in my presence. The recorded information has been reviewed and is accurate.        Nira Conn, MD 02/21/17 [phone]

## 2017-03-14 ENCOUNTER — Encounter: Payer: Self-pay | Admitting: Family Medicine

## 2017-03-14 ENCOUNTER — Ambulatory Visit: Payer: Self-pay | Admitting: Family Medicine

## 2017-03-14 VITALS — BP 133/93 | HR 112 | Wt 186.0 lb

## 2017-03-14 DIAGNOSIS — Z3A14 14 weeks gestation of pregnancy: Secondary | ICD-10-CM

## 2017-03-14 NOTE — Progress Notes (Signed)
Patient stated having pain last night at a level 7 and could not sleep. She also states that she check her blood pressure at home and it has been high and she ran out of her blood pressure medication. Also she went to [facility] 3 weeks ago for shortness of breath and they doctor told her it was tachycardia.Patient also states of having extra clear discharge no odor and very itching.

## 2017-03-14 NOTE — Progress Notes (Signed)
Patient not seen by provider - will get [place] for viability.

## 2017-03-19 ENCOUNTER — Ambulatory Visit (HOSPITAL_COMMUNITY)
Admission: RE | Admit: 2017-03-19 | Discharge: 2017-03-19 | Disposition: A | Discharge status: Home / Self Care | Payer: Self-pay | Source: Ambulatory Visit | Attending: Family Medicine | Admitting: Family Medicine

## 2017-03-19 DIAGNOSIS — Z3A14 14 weeks gestation of pregnancy: Secondary | ICD-10-CM

## 2017-03-19 DIAGNOSIS — Z3689 Encounter for other specified antenatal screening: Secondary | ICD-10-CM | POA: Insufficient documentation

## 2017-03-19 DIAGNOSIS — Z3491 Encounter for supervision of normal pregnancy, unspecified, first trimester: Secondary | ICD-10-CM | POA: Insufficient documentation

## 2017-03-20 ENCOUNTER — Telehealth: Payer: Self-pay | Admitting: General Practice

## 2017-03-20 NOTE — Telephone Encounter (Signed)
Per Alanda Amass, patient requests call back for results. Called patient with pacific interpreter [phone] at both numbers, no answer on either- left message to call us back as we are trying to return your phone call.

## 2017-03-21 NOTE — Telephone Encounter (Signed)
Called patient with pacific interpreter [phone] at mobile number, no answer- left message that we are trying to reach you to return your phone call, please call us back. Called home number & left message with patient's husband that we are trying to return her call.

## 2017-03-22 ENCOUNTER — Telehealth: Payer: Self-pay | Admitting: General Practice

## 2017-03-22 NOTE — Telephone Encounter (Signed)
Patient requests call back for results

## 2017-04-03 ENCOUNTER — Ambulatory Visit (INDEPENDENT_AMBULATORY_CARE_PROVIDER_SITE_OTHER): Payer: Self-pay | Admitting: *Deleted

## 2017-04-03 VITALS — BP 124/78 | HR 86

## 2017-04-03 DIAGNOSIS — N898 Other specified noninflammatory disorders of vagina: Secondary | ICD-10-CM

## 2017-04-03 DIAGNOSIS — Z113 Encounter for screening for infections with a predominantly sexual mode of transmission: Secondary | ICD-10-CM

## 2017-04-03 DIAGNOSIS — O0990 Supervision of high risk pregnancy, unspecified, unspecified trimester: Secondary | ICD-10-CM

## 2017-04-03 DIAGNOSIS — I10 Essential (primary) hypertension: Secondary | ICD-10-CM

## 2017-04-03 DIAGNOSIS — O26892 Other specified pregnancy related conditions, second trimester: Secondary | ICD-10-CM

## 2017-04-03 DIAGNOSIS — R300 Dysuria: Secondary | ICD-10-CM

## 2017-04-03 DIAGNOSIS — R51 Headache: Secondary | ICD-10-CM

## 2017-04-03 DIAGNOSIS — R519 Headache, unspecified: Secondary | ICD-10-CM

## 2017-04-03 LAB — POCT URINALYSIS DIP (DEVICE)
Bilirubin Urine: NEGATIVE
Glucose, UA: NEGATIVE mg/dL
Ketones, ur: NEGATIVE mg/dL
Nitrite: NEGATIVE
Protein, ur: NEGATIVE mg/dL
Specific Gravity, Urine: 1.015 (ref 1.005–1.030)
Urobilinogen, UA: 1 mg/dL (ref 0.0–1.0)
pH: 7 (ref 5.0–8.0)

## 2017-04-03 MED ORDER — CEPHALEXIN 500 MG PO CAPS: 500.0000 mg | ORAL_CAPSULE | Freq: Four times a day (QID) | ORAL | 0 refills | Status: DC

## 2017-04-03 NOTE — Progress Notes (Addendum)
Pt expressed concerns that her BP may be high because she has been having frequent headaches and nausea. Her BP machine @ home is broken so she is not able to check it. She also has been having difficulty sleeping. BP is normal today, pt was advised to take Tylenol for the headache and Benadryl to help with sleep. Pt reports painful urination and vaginal discharge. Random UA shows large leukocytes and trace hemoglobin. Consult with Sharen Counter and Rx for Keflex was received. Pt performed self vaginal swab for testing and will be called once results are received. Pt has New Ob appt on 5/22.  Per chart review, she had Ob US on 4/10 which showed EDD 09/21/17 - therefore pt is now 15w 4d, not 20w 5d as per LMP.  [place] for anatomy ordered today and scheduled on same Brit Carbonell as New Ob appt. It was also noted that pt has Hx of Hypertension. She stated that she previously was taking Metoprolol except while pregnant and then she was taking Methyldopa. She was last taking Methyldopa but ran out 3 months ago and did not have a doctor to prescribe refills. I advised pt that I will consult with a doctor today and call her back with information and advice regarding BP medication.  Pt voiced understanding of all information and instructions given.   1400  Consult with Dr. Macon Large re: need for anti-hypertensive medication @ this time.  She advised waiting until pt has full new Ob visit since pt was normotensive today. New Ob appt was changed to 5/7 @ 0740. Pt notified via telephone.

## 2017-04-04 LAB — CERVICOVAGINAL ANCILLARY ONLY
Bacterial vaginitis: POSITIVE — AB
Candida vaginitis: POSITIVE — AB
Chlamydia: NEGATIVE
Neisseria Gonorrhea: NEGATIVE
Trichomonas: NEGATIVE

## 2017-04-05 LAB — CULTURE, OB URINE

## 2017-04-05 LAB — URINE CULTURE, OB REFLEX

## 2017-04-09 ENCOUNTER — Other Ambulatory Visit: Payer: Self-pay | Admitting: Advanced Practice Midwife

## 2017-04-09 MED ORDER — METRONIDAZOLE 500 MG PO TABS: 500.0000 mg | ORAL_TABLET | Freq: Two times a day (BID) | ORAL | 0 refills | Status: DC

## 2017-04-09 MED ORDER — TERCONAZOLE 0.4 % VA CREA: 1.0000 | TOPICAL_CREAM | Freq: Every day | VAGINAL | 0 refills | Status: DC

## 2017-04-15 ENCOUNTER — Ambulatory Visit (INDEPENDENT_AMBULATORY_CARE_PROVIDER_SITE_OTHER): Payer: Self-pay | Admitting: Family Medicine

## 2017-04-15 ENCOUNTER — Encounter: Payer: Self-pay | Admitting: General Practice

## 2017-04-15 ENCOUNTER — Encounter: Payer: Self-pay | Admitting: Family Medicine

## 2017-04-15 VITALS — BP 125/85 | HR 76 | Wt 188.2 lb

## 2017-04-15 DIAGNOSIS — O09299 Supervision of pregnancy with other poor reproductive or obstetric history, unspecified trimester: Secondary | ICD-10-CM

## 2017-04-15 DIAGNOSIS — N760 Acute vaginitis: Secondary | ICD-10-CM

## 2017-04-15 DIAGNOSIS — O10919 Unspecified pre-existing hypertension complicating pregnancy, unspecified trimester: Secondary | ICD-10-CM

## 2017-04-15 DIAGNOSIS — O34219 Maternal care for unspecified type scar from previous cesarean delivery: Secondary | ICD-10-CM

## 2017-04-15 DIAGNOSIS — O10912 Unspecified pre-existing hypertension complicating pregnancy, second trimester: Secondary | ICD-10-CM

## 2017-04-15 DIAGNOSIS — O09292 Supervision of pregnancy with other poor reproductive or obstetric history, second trimester: Secondary | ICD-10-CM

## 2017-04-15 DIAGNOSIS — B9689 Other specified bacterial agents as the cause of diseases classified elsewhere: Secondary | ICD-10-CM

## 2017-04-15 DIAGNOSIS — O23592 Infection of other part of genital tract in pregnancy, second trimester: Secondary | ICD-10-CM

## 2017-04-15 DIAGNOSIS — O23599 Infection of other part of genital tract in pregnancy, unspecified trimester: Secondary | ICD-10-CM

## 2017-04-15 DIAGNOSIS — O0992 Supervision of high risk pregnancy, unspecified, second trimester: Secondary | ICD-10-CM

## 2017-04-15 DIAGNOSIS — O0990 Supervision of high risk pregnancy, unspecified, unspecified trimester: Secondary | ICD-10-CM

## 2017-04-15 DIAGNOSIS — Z98891 History of uterine scar from previous surgery: Secondary | ICD-10-CM | POA: Insufficient documentation

## 2017-04-15 DIAGNOSIS — O2342 Unspecified infection of urinary tract in pregnancy, second trimester: Secondary | ICD-10-CM

## 2017-04-15 LAB — POCT URINALYSIS DIP (DEVICE)
Bilirubin Urine: NEGATIVE
Glucose, UA: NEGATIVE mg/dL
Ketones, ur: NEGATIVE mg/dL
Nitrite: NEGATIVE
Protein, ur: NEGATIVE mg/dL
Specific Gravity, Urine: 1.01 (ref 1.005–1.030)
Urobilinogen, UA: 0.2 mg/dL (ref 0.0–1.0)
pH: 6 (ref 5.0–8.0)

## 2017-04-15 MED ORDER — ASPIRIN EC 81 MG PO TBEC: 81.0000 mg | DELAYED_RELEASE_TABLET | Freq: Every day | ORAL | 3 refills | Status: DC

## 2017-04-15 MED ORDER — TERCONAZOLE 0.4 % VA CREA: 1.0000 | TOPICAL_CREAM | Freq: Every day | VAGINAL | 0 refills | Status: DC

## 2017-04-15 MED ORDER — METRONIDAZOLE 500 MG PO TABS: 500.0000 mg | ORAL_TABLET | Freq: Two times a day (BID) | ORAL | 0 refills | Status: AC

## 2017-04-15 NOTE — Patient Instructions (Signed)
Segundo trimestre de embarazo (Second Trimester of Pregnancy) El segundo trimestre va desde la semana13 hasta la 28, desde el cuarto hasta el sexto mes, y suele ser el momento en el que mejor se siente. En general, las nuseas matutinas han disminuido o han desaparecido completamente. Tendr ms energa y podr aumentarle el apetito. El beb por nacer (feto) se desarrolla rpidamente. Hacia el final del sexto mes, el beb mide aproximadamente 9 pulgadas (23 cm) y pesa alrededor de 1 libras (700 g). Es probable que sienta al beb moverse (dar pataditas) entre las 18 y 20 semanas del embarazo. CUIDADOS EN EL HOGAR  No fume, no consuma hierbas ni beba alcohol. No tome frmacos que el mdico no haya autorizado.  No consuma ningn producto que contenga tabaco, lo que incluye cigarrillos, tabaco de mascar o cigarrillos electrnicos. Si necesita ayuda para dejar de fumar, consulte al mdico. Puede recibir asesoramiento u otro tipo de apoyo para dejar de fumar.  Tome los medicamentos solamente como se lo haya indicado el mdico. Algunos medicamentos son seguros para tomar durante el embarazo y otros no lo son.  Haga ejercicios solamente como se lo haya indicado el mdico. Interrumpa la actividad fsica si comienza a tener calambres.  Ingiera alimentos saludables de manera regular.  Use un sostn que le brinde buen soporte si sus mamas estn sensibles.  No se d baos de inmersin en agua caliente, baos turcos ni saunas.  Colquese el cinturn de seguridad cuando conduzca.  No coma carne cruda ni queso sin cocinar; evite el contacto con las bandejas sanitarias de los gatos y la tierra que estos animales usan.  Tome las vitaminas prenatales.  Tome entre 1500 y 2000mg de calcio diariamente comenzando en la semana20 del embarazo hasta el parto.  Pruebe tomar un medicamento que la ayude a defecar (un laxante suave) si el mdico lo autoriza. Consuma ms fibra, que se encuentra en las frutas y  verduras frescas y los cereales integrales. Beba suficiente lquido para mantener el pis (orina) claro o de color amarillo plido.  Dese baos de asiento con agua tibia para aliviar el dolor o las molestias causadas por las hemorroides. Use una crema para las hemorroides si el mdico la autoriza.  Si se le hinchan las venas (venas varicosas), use medias de descanso. Levante (eleve) los pies durante 15minutos, 3 o 4veces por da. Limite el consumo de sal en su dieta.  No levante objetos pesados, use zapatos de tacones bajos y sintese derecha.  Descanse con las piernas elevadas si tiene calambres o dolor de cintura.  Visite a su dentista si no lo ha hecho durante el embarazo. Use un cepillo de cerdas suaves para cepillarse los dientes. Psese el hilo dental con suavidad.  Puede seguir manteniendo relaciones sexuales, a menos que el mdico le indique lo contrario.  Concurra a los controles mdicos.  SOLICITE AYUDA SI:  Siente mareos.  Sufre calambres o presin leves en la parte baja del vientre (abdomen).  Sufre un dolor persistente en el abdomen.  Tiene malestar estomacal (nuseas), vmitos, o tiene deposiciones acuosas (diarrea).  Advierte un olor ftido que proviene de la vagina.  Siente dolor al orinar.  SOLICITE AYUDA DE INMEDIATO SI:  Tiene fiebre.  Tiene una prdida de lquido por la vagina.  Tiene sangrado o pequeas prdidas vaginales.  Siente dolor intenso o clicos en el abdomen.  Sube o baja de peso rpidamente.  Tiene dificultades para recuperar el aliento y siente dolor en el pecho.  Sbitamente se   mucho el rostro, las Blue Diamondmanos, los tobillos, los pies o las piernas.  No ha sentido los movimientos del beb durante Georgianne Fickuna hora.  Siente un dolor de cabeza intenso que no se alivia con medicamentos.  Su visin se modifica. Esta informacin no tiene Theme park managercomo fin reemplazar el consejo del mdico. Asegrese de hacerle al mdico cualquier pregunta que  tenga. Document Released: 07/29/2013 Document Revised: 12/17/2014 Document Reviewed: 01/27/2013 Elsevier Interactive Patient Education  2017 Elsevier Inc.  Hipertensin durante el embarazo (Hypertension During Pregnancy) La hipertensin tambin se denomina presin arterial alta. La presin arterial hace que se mueva la sangre en el cuerpo. A veces, la fuerza que Northrop Grummanmueve la sangre es demasiado intensa. Cuando est embarazada, esta afeccin se debe controlar atentamente. Puede causar problemas para usted y su beb. CUIDADOS EN EL HOGAR  Cumpla con todos los controles mdicos.  Tome los United Parcelmedicamentos como le indic su mdico. Informe a su mdico sobre todos los medicamentos que toma.  Coma muy poca sal.  Haga ejercicios regularmente.  No beba alcohol.  No fume.  No tome bebidas con cafena.  Acustese sobre el lado izquierdo cuando haga reposo.  Su mdico puede recomendarle que tome una aspirina de dosis baja (81 mg) cada da. SOLICITE AYUDA DE INMEDIATO SI:  Siente un dolor intenso en el vientre (abdominal).  Nota una hinchazn repentina Jabil Circuiten las manos, los tobillos o la cara.  Aument 4libras (1,8kg) o ms en 1semana.  Vomita) repetidas veces.  Tiene una hemorragia por la vagina.  No siente los movimientos del beb.  Tiene cefalea.  Tiene visin doble o borrosa.  Tiene calambres o espasmos musculares.  Le falta el aire.  Tiene las yemas de los dedos y los labios Vinelandazules.  Observa sangre en la orina. ASEGRESE DE QUE:  Comprende estas instrucciones.  Controlar su afeccin.  Recibir ayuda de inmediato si no mejora o si empeora. Esta informacin no tiene Theme park managercomo fin reemplazar el consejo del mdico. Asegrese de hacerle al mdico cualquier pregunta que tenga. Document Released: 03/13/2011 Document Revised: 12/17/2014 Document Reviewed: 05/11/2016 Elsevier Interactive Patient Education  2017 [employer]

## 2017-04-15 NOTE — Progress Notes (Signed)
New OB Note  04/15/2017   CC:  Chief Complaint  Patient presents with  . Initial Prenatal Visit    Transfer of Care Patient: no  History of Present Illness: Jennifer Holmes is a 31 y.o. G3P1102 at [redacted]w[redacted]d by early ultrasound being seen today for her first obstetrical visit. 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 Patient does intend to breast feed. Patient plans to do BTL (emergency medicaid) or vasectomy w/ LARC for contraception after completion of pregnancy. Pregnancy history fully reviewed.  Her periods were: irregular periods from 2 weeks to 3-4 months She was using oral contraceptives (estrogen/progesterone) when she conceived. The POPs she was using was from [place] from her mother's friend who was a doctor, sent multiple boxes.  She has Negative signs or symptoms of nausea/vomiting of pregnancy. She has Negative signs or symptoms of miscarriage or preterm labor She identifies Negative Zika risk factors for her and her partner  Patient reports no complaints.  Any prior children are healthy, doing well, without any problems or issues: yes Complications in prior pregnancies: yes  Preeclampsia, HELLP, previous CS for LGA baby Complications in prior deliveries: yes, LGA with FTP and therefore cesarean section   ROS: A 12-point review of systems was performed and negative, except as stated in the above HPI.  HISTORY:  OBGYN History: As per HPI. OB History  Gravida Para Term Preterm AB Living  3 2 1 1  0 2  SAB TAB Ectopic Multiple Live Births  0 0 0 0 2    # Outcome Date GA Lbr Len/2nd Weight Sex Delivery Anes PTL Lv  3 Current           2 Preterm 01/24/15 [redacted]w[redacted]d  6 lb 15.6 oz (3.165 kg) F VBAC Local  LIV     Birth Comments: Born at 34  weeks, maternal HTN, vaginal delivery, jaundice at birth - phototherapy, home after 3 days.  1 Term 03/01/08 [redacted]w[redacted]d  8 lb (3.629 kg) M CS-Unspec Spinal  LIV     Birth Comments: c/s due to mother's heart problems; pp hemorrhage     Obstetric Comments  Had a blood transfusion with first pregnancy after her C/S in [place].    Past Medical History: Past Medical History:  Diagnosis Date  . Blood transfusion without reported diagnosis   . Fatty liver    per patient with last pregancy  . GERD (gastroesophageal reflux disease)   . Headache   . Hypertension   . PONV (postoperative nausea and vomiting)   . Preterm labor     Past Surgical History: Past Surgical History:  Procedure Laterality Date  . CESAREAN SECTION      Family History:  Family History  Problem Relation Age of Onset  . Hypertension Mother   . Heart disease Mother   . Hypertension Brother     She denies any female cancers, bleeding or blood clotting disorders.  She denies any history of mental retardation, birth defects or genetic disorders in her or the FOB's history (a 1st cousin on FOB has autism).  Social History:  Social History   Social History  . Marital status: Married    Spouse name: N/A  . Number of children: N/A  . Years of education: N/A  Occupational History  . Not on file.   Social History Main Topics  . Smoking status: Never Smoker  . Smokeless tobacco: Never Used  . Alcohol use No  . Drug use: No  . Sexual activity: Yes    Birth control/ protection: None, Condom, Pill   Other Topics Concern  . Not on file   Social History Narrative  . No narrative on file   Any pets in the household: no   Allergy: Allergies  Allergen Reactions  . Lactose Intolerance (Gi) Other (See Comments)    Reaction:  GI upset    Health Maintenance:  Mammogram Up to Date: not applicable Pap Smear Up to date: Yes, 2015, but due this year.  Last pap smear 2015. Abnormal: no History of STIs: No    Current Outpatient Medications:   Current Outpatient Prescriptions:  .  aspirin EC 81 MG tablet, Take 1 tablet (81 mg total) by mouth daily., Disp: 90 tablet, Rfl: 3 .  metroNIDAZOLE (FLAGYL) 500 MG tablet, Take 1 tablet (500 mg total) by mouth 2 (two) times daily., Disp: 14 tablet, Rfl: 0 .  terconazole (TERAZOL 7) 0.4 % vaginal cream, Place 1 applicator vaginally at bedtime., Disp: 45 g, Rfl: 0  Physical Exam:   BP 125/85   Pulse 76   Wt 188 lb 3.2 oz (85.4 kg)   LMP 11/09/2016 (LMP Unknown)   BMI 36.76 kg/m  Body mass index is 36.76 kg/m. Fundal height: Below umbilicus FHTs: 140s  Vitals:   04/15/17 0821  BP: 125/85  Pulse: 76  Weight: 188 lb 3.2 oz (85.4 kg)      System: General: well-developed, well-nourished female in no acute distress   Breast:  normal appearance, no masses or tenderness   Skin: normal coloration and turgor, no rashes   Neurologic/Psych: Alert, oriented, normal mood and affect, no gross deficits   Extremities: normal strength, tone, and muscle mass, ROM of all joints is normal   HEENT PERRLA, extraocular movement intact and sclera clear, anicteric   Mouth/Teeth mucous membranes moist, pharynx normal without lesions and dental hygiene good   Neck Supple, normal appearance, and no thyromegaly    Cardiovascular: S1, S2 normal, no murmur, rub or gallop, regular rate and rhythm   Respiratory:  Clear to auscultation bilateral. Normal respiratory effort   Abdomen: soft, non-tender; bowel sounds normal; no masses,  no organomegaly     Assessment/Plan: 31 y.o. Z6X0960 [redacted]w[redacted]d  1. Supervision of high risk pregnancy, antepartum - Discussed to start ASA due to h/o HELLP/preE and CHTN. Rx given today. Reviewed course of pregnancy with the patient. Patient will be a VBAC. BPs are normal today, not on any meds.  - Due to self pay, will refer patient to [facility] for pap after August (07/2014 when last pap was, normal). - Prenatal Profile I - aspirin EC 81 MG  tablet; Take 1 tablet (81 mg total) by mouth daily.  Dispense: 90 tablet; Refill: 3  2. History of HELLP syndrome, currently pregnant - Starting ASA, monitor BPs - Prenatal Profile I - aspirin EC 81 MG tablet; Take 1 tablet (81 mg total) by mouth daily.  Dispense: 90 tablet; Refill: 3  3. Chronic hypertension in pregnancy - Not taking any medication currently, ran out. BP is normal today, but has had multiple episodes of severe range BPs.  - Prenatal Profile I - aspirin EC 81 MG tablet; Take 1 tablet (81 mg total) by mouth daily.  Dispense: 90 tablet; Refill: 3  4.  Bacterial vaginosis - Dx 4/25, has not taken meds, informed today - metroNIDAZOLE (FLAGYL) 500 MG tablet; Take 1 tablet (500 mg total) by mouth 2 (two) times daily.  Dispense: 14 tablet; Refill: 0  5. Vaginitis affecting pregnancy, antepartum - Dx 4/25, has not taken meds, informed today - terconazole (TERAZOL 7) 0.4 % vaginal cream; Place 1 applicator vaginally at bedtime.  Dispense: 45 g; Refill: 0   Initial labs drawn. Continue prenatal vitamins. Genetic Screening discussed, First trimester screen, Quad screen and NIPS: declined. Ultrasound discussed; fetal anatomic survey: ordered. Problem list reviewed and updated. The nature of [facility] with multiple MDs and other Advanced Practice Providers was explained to patient; also emphasized that residents, students are part of our team. Routine obstetric precautions reviewed. Return in about 4 weeks (around 05/13/2017) for Routine OB visit; 1-2 weeks for RN visit for BP check.  >50% of 30 min visit spent on counseling and coordination of care.     Cleda ClarksElizabeth W. Mumaw, DO OB Fellow [facility])

## 2017-04-15 NOTE — Progress Notes (Signed)
Here for first prenatal visit. Today. Notified of need for flagyl and terazol due to wet prep showing BV and yeast.Given prenatal education booklets. States last night c/o felt headache , pain in neck, vomiting. Took bp at home and states it was something like 176/130, states drank water and felt better.  Oxygen sat today =100%. States symptoms were same as when hospitalized earlier this pregnancy. States 2 weeks ago had coffee colored discharge x 3 days then had fluid more like water 3 days. States feeling baby move. States was taking birth control pills and stopped when she found out she was pregnant at ED visit 02/11/17. Declines flu shot today. Before pregnancy was taking metroprolol and during pregancy until she ran out. Signed up for [facility] app. [facility] at 11:00.

## 2017-04-16 LAB — PRENATAL PROFILE I(LABCORP)
Antibody Screen: NEGATIVE
Basophils Absolute: 0.1 10*3/uL (ref 0.0–0.2)
Basos: 1 %
EOS (ABSOLUTE): 0.3 10*3/uL (ref 0.0–0.4)
Eos: 4 %
Hematocrit: 36.5 % (ref 34.0–46.6)
Hemoglobin: 12.4 g/dL (ref 11.1–15.9)
Hepatitis B Surface Ag: NEGATIVE
Immature Grans (Abs): 0 10*3/uL (ref 0.0–0.1)
Immature Granulocytes: 0 %
Lymphocytes Absolute: 2.6 10*3/uL (ref 0.7–3.1)
Lymphs: 38 %
MCH: 30.8 pg (ref 26.6–33.0)
MCHC: 34 g/dL (ref 31.5–35.7)
MCV: 91 fL (ref 79–97)
Monocytes Absolute: 0.4 10*3/uL (ref 0.1–0.9)
Monocytes: 6 %
Neutrophils Absolute: 3.5 10*3/uL (ref 1.4–7.0)
Neutrophils: 51 %
Platelets: 312 10*3/uL (ref 150–379)
RBC: 4.03 x10E6/uL (ref 3.77–5.28)
RDW: 15.2 % (ref 12.3–15.4)
RPR Ser Ql: NONREACTIVE
Rh Factor: POSITIVE
Rubella Antibodies, IGG: 1.96 index (ref >0.99)
WBC: 6.8 10*3/uL (ref 3.4–10.8)

## 2017-04-16 LAB — COMPREHENSIVE METABOLIC PANEL
ALT: 33 IU/L — ABNORMAL HIGH (ref 0–32)
AST: 38 IU/L (ref 0–40)
Albumin/Globulin Ratio: 1.5 (ref 1.2–2.2)
Albumin: 3.9 g/dL (ref 3.5–5.5)
Alkaline Phosphatase: 63 IU/L (ref 39–117)
BUN/Creatinine Ratio: 18 (ref 9–23)
BUN: 7 mg/dL (ref 6–20)
Bilirubin Total: 0.4 mg/dL (ref 0.0–1.2)
CO2: 21 mmol/L (ref 18–29)
Calcium: 8.7 mg/dL (ref 8.7–10.2)
Chloride: 102 mmol/L (ref 96–106)
Creatinine, Ser: 0.39 mg/dL — ABNORMAL LOW (ref 0.57–1.00)
GFR calc Af Amer: 164 mL/min/{1.73_m2} (ref >59)
GFR calc non Af Amer: 142 mL/min/{1.73_m2} (ref >59)
Globulin, Total: 2.6 g/dL (ref 1.5–4.5)
Glucose: 85 mg/dL (ref 65–99)
Potassium: 4.1 mmol/L (ref 3.5–5.2)
Sodium: 137 mmol/L (ref 134–144)
Total Protein: 6.5 g/dL (ref 6.0–8.5)

## 2017-04-16 LAB — PROTEIN / CREATININE RATIO, URINE
Creatinine, Urine: 28.8 mg/dL
Protein, Ur: 13.2 mg/dL
Protein/Creat Ratio: 458 mg/g creat — ABNORMAL HIGH (ref 0–200)

## 2017-04-16 LAB — HEMOGLOBIN A1C
Est. average glucose Bld gHb Est-mCnc: 105 mg/dL
Hgb A1c MFr Bld: 5.3 % (ref 4.8–5.6)

## 2017-04-18 LAB — URINE CULTURE, OB REFLEX

## 2017-04-18 LAB — CULTURE, OB URINE

## 2017-04-22 ENCOUNTER — Other Ambulatory Visit: Payer: Self-pay | Admitting: Family Medicine

## 2017-04-22 DIAGNOSIS — N390 Urinary tract infection, site not specified: Secondary | ICD-10-CM

## 2017-04-22 DIAGNOSIS — B9620 Unspecified Escherichia coli [E. coli] as the cause of diseases classified elsewhere: Secondary | ICD-10-CM

## 2017-04-22 MED ORDER — NITROFURANTOIN MONOHYD MACRO 100 MG PO CAPS: 100.0000 mg | ORAL_CAPSULE | Freq: Two times a day (BID) | ORAL | 0 refills | Status: DC

## 2017-04-23 ENCOUNTER — Telehealth: Payer: Self-pay | Admitting: *Deleted

## 2017-04-23 NOTE — Telephone Encounter (Addendum)
Called pt with [employer] interpreter # [phone] and informed her of +UTI requiring antibiotic. The prescription has been sent to her pharmacy. Pt voiced understanding and also had many questions regarding medications which were prescribed last week. She stated that she was given 2 bottles of the Metronidazole and 2 tubes of the Terconazole cream.  She wanted to know if this was correct. I advised that the doctor did not order that quantity and I will call the pharmacy regarding this. She should not take the second bottle of Metronidazole and since she has used the Terconazole cream for 8 days, she does not need any more.  I answered her questions to her satisfaction. Total time spent on phone call = 35 minutes.    ----- Message from [facility] Woodland Mumaw, DO sent at 04/22/2017  2:31 AM EDT ----- Please call patient to inform her that she has a UTI, and I have sent medication to her pharmacy for treatment (Macrobid). Thank you.  Dr. Omer JackMumaw

## 2017-04-29 ENCOUNTER — Ambulatory Visit (INDEPENDENT_AMBULATORY_CARE_PROVIDER_SITE_OTHER): Payer: Self-pay | Admitting: *Deleted

## 2017-04-29 VITALS — BP 111/69 | HR 91 | Wt 189.9 lb

## 2017-04-29 DIAGNOSIS — I10 Essential (primary) hypertension: Secondary | ICD-10-CM

## 2017-04-29 DIAGNOSIS — O09299 Supervision of pregnancy with other poor reproductive or obstetric history, unspecified trimester: Secondary | ICD-10-CM

## 2017-04-29 DIAGNOSIS — O0990 Supervision of high risk pregnancy, unspecified, unspecified trimester: Secondary | ICD-10-CM

## 2017-04-29 NOTE — Progress Notes (Signed)
BP wnl. Instructed to keep ob visit as scheduled.

## 2017-04-30 ENCOUNTER — Ambulatory Visit (HOSPITAL_COMMUNITY): Payer: Self-pay

## 2017-04-30 ENCOUNTER — Encounter (HOSPITAL_COMMUNITY): Payer: Self-pay

## 2017-04-30 ENCOUNTER — Encounter: Payer: Self-pay | Admitting: Obstetrics and Gynecology

## 2017-05-13 ENCOUNTER — Ambulatory Visit (INDEPENDENT_AMBULATORY_CARE_PROVIDER_SITE_OTHER): Payer: Self-pay | Admitting: Obstetrics & Gynecology

## 2017-05-13 VITALS — BP 131/106 | HR 105 | Wt 191.4 lb

## 2017-05-13 DIAGNOSIS — O0990 Supervision of high risk pregnancy, unspecified, unspecified trimester: Secondary | ICD-10-CM

## 2017-05-13 DIAGNOSIS — J302 Other seasonal allergic rhinitis: Secondary | ICD-10-CM

## 2017-05-13 DIAGNOSIS — O0992 Supervision of high risk pregnancy, unspecified, second trimester: Secondary | ICD-10-CM

## 2017-05-13 DIAGNOSIS — O10919 Unspecified pre-existing hypertension complicating pregnancy, unspecified trimester: Secondary | ICD-10-CM

## 2017-05-13 DIAGNOSIS — O4402 Placenta previa specified as without hemorrhage, second trimester: Secondary | ICD-10-CM

## 2017-05-13 DIAGNOSIS — O4400 Placenta previa specified as without hemorrhage, unspecified trimester: Secondary | ICD-10-CM | POA: Insufficient documentation

## 2017-05-13 DIAGNOSIS — O10912 Unspecified pre-existing hypertension complicating pregnancy, second trimester: Secondary | ICD-10-CM

## 2017-05-13 LAB — POCT URINALYSIS DIP (DEVICE)
Bilirubin Urine: NEGATIVE
Glucose, UA: NEGATIVE mg/dL
Hgb urine dipstick: NEGATIVE
Ketones, ur: NEGATIVE mg/dL
Nitrite: NEGATIVE
Protein, ur: NEGATIVE mg/dL
Specific Gravity, Urine: 1.025 (ref 1.005–1.030)
Urobilinogen, UA: 0.2 mg/dL (ref 0.0–1.0)
pH: 6.5 (ref 5.0–8.0)

## 2017-05-13 MED ORDER — TRIAMCINOLONE ACETONIDE 0.1 % EX OINT: 1.0000 "application " | TOPICAL_OINTMENT | Freq: Two times a day (BID) | CUTANEOUS | 0 refills | Status: DC

## 2017-05-13 MED ORDER — LABETALOL HCL 200 MG PO TABS: 100.0000 mg | ORAL_TABLET | Freq: Two times a day (BID) | ORAL | 3 refills | Status: DC

## 2017-05-13 MED ORDER — CETIRIZINE HCL 10 MG PO CAPS: 10.0000 mg | ORAL_CAPSULE | Freq: Every day | ORAL | 3 refills | Status: DC

## 2017-05-13 MED ORDER — PROMETHAZINE HCL 25 MG PO TABS: 25.0000 mg | ORAL_TABLET | Freq: Four times a day (QID) | ORAL | 2 refills | Status: DC | PRN

## 2017-05-13 MED ORDER — METOCLOPRAMIDE HCL 10 MG PO TABS: 10.0000 mg | ORAL_TABLET | Freq: Four times a day (QID) | ORAL | 1 refills | Status: DC

## 2017-05-13 NOTE — Progress Notes (Signed)
OB US scheduled for June 19th @ 0900.  Pt notified.

## 2017-05-13 NOTE — Progress Notes (Signed)
    PRENATAL VISIT NOTE  Subjective:  Jennifer Holmes is a 31 y.o. G3P1102 at 564w2d being seen today for ongoing prenatal care.  She is currently monitored for the following issues for this high-risk pregnancy and has History of fatty infiltration of liver; Chronic hypertension in pregnancy; History of HELLP syndrome, currently pregnant; Hypertension, benign; Supervision of high risk pregnancy, antepartum; History of vaginal delivery following previous cesarean delivery; History of cesarean delivery, currently pregnant; and Placenta previa antepartum on her problem list.  Patient reports N/V, left arm irritation, occasional headaches.  Contractions: Irregular. Vag. Bleeding: None.  Movement: Present. Denies leaking of fluid.   The following portions of the patient's history were reviewed and updated as appropriate: allergies, current medications, past family history, past medical history, past social history, past surgical history and problem list. Problem list updated.  Objective:   Vitals:   05/13/17 0819 05/13/17 0827  BP: (!) 135/93 (!) 131/106  Pulse: 96 (!) 105  Weight: 191 lb 6.4 oz (86.8 kg)     Fetal Status: Fetal Heart Rate (bpm): 140   Movement: Present     General:  Alert, oriented and cooperative. Patient is in no acute distress.  Skin: Skin is warm and dry. No rash noted.   Cardiovascular: Normal heart rate noted  Respiratory: Normal respiratory effort, no problems with respiration noted  Abdomen: Soft, gravid, appropriate for gestational age. Pain/Pressure: Absent     Pelvic:  Cervical exam deferred        Extremities: Normal range of motion.  Edema: Mild pitting, slight indentation  Mental Status: Normal mood and affect. Normal behavior. Normal judgment and thought content.   Assessment and Plan:  Pregnancy: G3P1102 at 424w2d  1. Placenta previa antepartum Placenta previa noted on US.  Pt c/o bleeding last week--used 3 pads.  Not associated with intercourse.   Pt given vag rest instructions.  Rpt US at [facility] in 2 weeks.  No bleeding for a week. - US MFM OB COMP + 14 WK; Future  2. Supervision of high risk pregnancy, antepartum #Patient states she has been experiencing nausea, vomiting and headaches that are irregular and not every day. Patient does not know what brings these symptoms on.   N/V--reglan and phenergan off $4 [employer] list prn. #Left arm skin rash and irritation--triamcenalone  3. Seasonal allergic rhinitis, unspecified trigger Cough, "itchy throat"--start generic zyrtec;   Exam c/w seasonal allergies.  4. Chronic hypertension in pregnancy CHTN--start labetalol 100 mg bid.  BP mildly elevated.  Preterm labor symptoms and general obstetric precautions including but not limited to vaginal bleeding, contractions, leaking of fluid and fetal movement were reviewed in detail with the patient. Please refer to After Visit Summary for other counseling recommendations.  Return in about 2 weeks (around 05/27/2017).   Elsie LincolnKelly Tonye Tancredi, MD

## 2017-05-23 ENCOUNTER — Encounter (HOSPITAL_COMMUNITY): Payer: Self-pay

## 2017-05-23 ENCOUNTER — Inpatient Hospital Stay (HOSPITAL_COMMUNITY)
Admission: AD | Admit: 2017-05-23 | Discharge: 2017-05-23 | Disposition: A | Discharge status: Home / Self Care | Payer: Self-pay | Source: Ambulatory Visit | Attending: Obstetrics & Gynecology | Admitting: Obstetrics & Gynecology

## 2017-05-23 DIAGNOSIS — Z79899 Other long term (current) drug therapy: Secondary | ICD-10-CM | POA: Insufficient documentation

## 2017-05-23 DIAGNOSIS — O162 Unspecified maternal hypertension, second trimester: Secondary | ICD-10-CM | POA: Insufficient documentation

## 2017-05-23 DIAGNOSIS — R1013 Epigastric pain: Secondary | ICD-10-CM

## 2017-05-23 DIAGNOSIS — O0990 Supervision of high risk pregnancy, unspecified, unspecified trimester: Secondary | ICD-10-CM

## 2017-05-23 DIAGNOSIS — O9989 Other specified diseases and conditions complicating pregnancy, childbirth and the puerperium: Secondary | ICD-10-CM

## 2017-05-23 DIAGNOSIS — Z7982 Long term (current) use of aspirin: Secondary | ICD-10-CM | POA: Insufficient documentation

## 2017-05-23 DIAGNOSIS — Z3A22 22 weeks gestation of pregnancy: Secondary | ICD-10-CM | POA: Insufficient documentation

## 2017-05-23 DIAGNOSIS — K219 Gastro-esophageal reflux disease without esophagitis: Secondary | ICD-10-CM | POA: Insufficient documentation

## 2017-05-23 DIAGNOSIS — O99612 Diseases of the digestive system complicating pregnancy, second trimester: Secondary | ICD-10-CM | POA: Insufficient documentation

## 2017-05-23 LAB — CBC WITH DIFFERENTIAL/PLATELET
Basophils Absolute: 0 K/uL (ref 0.0–0.1)
Basophils Relative: 0 %
Eosinophils Absolute: 0.2 K/uL (ref 0.0–0.7)
Eosinophils Relative: 2 %
HCT: 36.8 % (ref 36.0–46.0)
Hemoglobin: 13.1 g/dL (ref 12.0–15.0)
Lymphocytes Relative: 30 %
Lymphs Abs: 2.5 K/uL (ref 0.7–4.0)
MCH: 32.3 pg (ref 26.0–34.0)
MCHC: 35.6 g/dL (ref 30.0–36.0)
MCV: 90.9 fL (ref 78.0–100.0)
Monocytes Absolute: 0.3 K/uL (ref 0.1–1.0)
Monocytes Relative: 4 %
Neutro Abs: 5.2 K/uL (ref 1.7–7.7)
Neutrophils Relative %: 64 %
Platelets: 306 K/uL (ref 150–400)
RBC: 4.05 MIL/uL (ref 3.87–5.11)
RDW: 14.4 % (ref 11.5–15.5)
WBC: 8.3 K/uL (ref 4.0–10.5)

## 2017-05-23 LAB — URINALYSIS, ROUTINE W REFLEX MICROSCOPIC
Bacteria, UA: NONE SEEN
Bilirubin Urine: NEGATIVE
Glucose, UA: NEGATIVE mg/dL
Hgb urine dipstick: NEGATIVE
Ketones, ur: NEGATIVE mg/dL
Nitrite: NEGATIVE
Protein, ur: NEGATIVE mg/dL
Specific Gravity, Urine: 1.001 — ABNORMAL LOW (ref 1.005–1.030)
Squamous Epithelial / LPF: NONE SEEN
pH: 7 (ref 5.0–8.0)

## 2017-05-23 LAB — COMPREHENSIVE METABOLIC PANEL
ALT: 41 U/L (ref 14–54)
AST: 44 U/L — ABNORMAL HIGH (ref 15–41)
Albumin: 3.7 g/dL (ref 3.5–5.0)
Alkaline Phosphatase: 67 U/L (ref 38–126)
Anion gap: 6 (ref 5–15)
BUN: 6 mg/dL (ref 6–20)
CO2: 22 mmol/L (ref 22–32)
Calcium: 8.8 mg/dL — ABNORMAL LOW (ref 8.9–10.3)
Chloride: 107 mmol/L (ref 101–111)
Creatinine, Ser: 0.34 mg/dL — ABNORMAL LOW (ref 0.44–1.00)
GFR calc Af Amer: >60 mL/min (ref >60)
GFR calc non Af Amer: >60 mL/min (ref >60)
Glucose, Bld: 96 mg/dL (ref 65–99)
Potassium: 3.7 mmol/L (ref 3.5–5.1)
Sodium: 135 mmol/L (ref 135–145)
Total Bilirubin: 0.6 mg/dL (ref 0.3–1.2)
Total Protein: 7.3 g/dL (ref 6.5–8.1)

## 2017-05-23 LAB — AMYLASE: Amylase: 41 U/L (ref 28–100)

## 2017-05-23 LAB — LIPASE, BLOOD: Lipase: 19 U/L (ref 11–51)

## 2017-05-23 MED ORDER — GI COCKTAIL ~~LOC~~
30.0000 mL | Freq: Once | ORAL | Status: AC
  Administered 2017-05-23: 30 mL via ORAL
  Filled 2017-05-23: qty 30

## 2017-05-23 MED ORDER — PROMETHAZINE HCL 25 MG PO TABS
25.0000 mg | ORAL_TABLET | Freq: Once | ORAL | Status: AC
  Administered 2017-05-23: 25 mg via ORAL
  Filled 2017-05-23: qty 1

## 2017-05-23 MED ORDER — PANTOPRAZOLE SODIUM 20 MG PO TBEC: 20.0000 mg | DELAYED_RELEASE_TABLET | Freq: Every day | ORAL | 2 refills | Status: DC

## 2017-05-23 NOTE — MAU Provider Note (Signed)
Chief Complaint:  Abdominal pain, nausea   Jennifer Holmes is a 31 y.o. [id number].  Her pregnancy status is positive ([redacted]w[redacted]d).  She presents with midepigastric pain and nausea without vomiting. Symptoms started at [facility] and have been constant throughout the day. A few hours ago, she experienced some shortness of breath / difficulty catching her breath and decided to come to the MAU. She describes the pain as sharp and somewhat burning. It is worse when supine, and improves slightly when ambulating. She has not eaten much today, as drinking water exacerbates the pain and she is concerned food would make it worse. Patient has had GERD in the past, and believes this pain is different. She tried taking Tums and it did not help. She endorses headache and intermittent dizziness since this afternoon; she also thought she might have had a fever earlier in the day and described feeling chills and sweats. Recently she has noted increased edema in her feet. Denies constipation, diarrhea.   Obstetrical/Gynecological History: [id number] C-section in first pregnancy  Past Medical History: Past Medical History:  Diagnosis Date  . Blood transfusion without reported diagnosis   . Fatty liver    per patient with last pregancy  . GERD (gastroesophageal reflux disease)   . Headache   . Hypertension   . PONV (postoperative nausea and vomiting)   . Preterm labor     Past Surgical History: Past Surgical History:  Procedure Laterality Date  . CESAREAN SECTION      Family History: Family History  Problem Relation Age of Onset  . Hypertension Mother   . Heart disease Mother   . Hypertension Brother     Social History: Social History  Substance Use Topics  . Smoking status: Never Smoker  . Smokeless tobacco: Never Used  . Alcohol use No    Allergies: Allergies  Allergen Reactions  . Lactose Intolerance (Gi) Other (See Comments)    Reaction:  GI upset    Current Medications: Prescriptions  Prior to Admission  Medication Sig Dispense Refill Last Dose  . aspirin EC 81 MG tablet Take 1 tablet (81 mg total) by mouth daily. 90 tablet 3 Taking  . Cetirizine HCl 10 MG CAPS Take 1 capsule (10 mg total) by mouth daily. 30 capsule 3   . labetalol (NORMODYNE) 200 MG tablet Take 0.5 tablets (100 mg total) by mouth 2 (two) times daily. 60 tablet 3   . metoCLOPramide (REGLAN) 10 MG tablet Take 1 tablet (10 mg total) by mouth 4 (four) times daily. 60 tablet 1   . Prenatal Multivit-Min-Fe-FA (PRENATAL VITAMINS PO) Take 1 tablet by mouth daily.   Taking  . promethazine (PHENERGAN) 25 MG tablet Take 1 tablet (25 mg total) by mouth every 6 (six) hours as needed for nausea or vomiting. 12 tablet 2   . triamcinolone ointment (KENALOG) 0.1 % Apply 1 application topically 2 (two) times daily. 80 g 0     Review of Systems (+) midepigastric pain, nausea, headache, intermittent dizziness, sweats, chills  (-) constipation, diarrhea  Physical Exam   BP (!) 149/95   Pulse (!) 105   Temp 98.2 F (36.8 C) (Axillary)   Resp 18   Wt 87.6 kg (193 lb 0.6 oz)   LMP 11/09/2016 (LMP Unknown)   BMI 37.70 kg/m   General: General appearance - alert but uncomfortable-appearing; attempting to vomit but unable to Chest - CTA bilaterally Heart - RRR; normal S1, S2; no m/r/g Abdomen - tender to deep palpation in mid-epigastric  area; otherwise non-tender throughout all quadrants   Focused Gynecological Exam: examination not indicated  Labs: Results for orders placed or performed during the hospital encounter of 05/23/17 (from the past 24 hour(s))  Urinalysis, Routine w reflex microscopic   Collection Time: 05/23/17  5:35 PM  Result Value Ref Range   Color, Urine STRAW (A) YELLOW   APPearance CLEAR CLEAR   Specific Gravity, Urine 1.001 (L) 1.005 - 1.030   pH 7.0 5.0 - 8.0   Glucose, UA NEGATIVE NEGATIVE mg/dL   Hgb urine dipstick NEGATIVE NEGATIVE   Bilirubin Urine NEGATIVE NEGATIVE   Ketones, ur  NEGATIVE NEGATIVE mg/dL   Protein, ur NEGATIVE NEGATIVE mg/dL   Nitrite NEGATIVE NEGATIVE   Leukocytes, UA TRACE (A) NEGATIVE   RBC / HPF 0-5 0 - 5 RBC/hpf   WBC, UA 0-5 0 - 5 WBC/hpf   Bacteria, UA NONE SEEN NONE SEEN   Squamous Epithelial / LPF NONE SEEN NONE SEEN  CBC with Differential/Platelet   Collection Time: 05/23/17  6:37 PM  Result Value Ref Range   WBC 8.3 4.0 - 10.5 K/uL   RBC 4.05 3.87 - 5.11 MIL/uL   Hemoglobin 13.1 12.0 - 15.0 g/dL   HCT 91.436.8 78.236.0 - 95.646.0 %   MCV 90.9 78.0 - 100.0 fL   MCH 32.3 26.0 - 34.0 pg   MCHC 35.6 30.0 - 36.0 g/dL   RDW 21.314.4 08.611.5 - 57.815.5 %   Platelets 306 150 - 400 K/uL   Neutrophils Relative % 64 %   Neutro Abs 5.2 1.7 - 7.7 K/uL   Lymphocytes Relative 30 %   Lymphs Abs 2.5 0.7 - 4.0 K/uL   Monocytes Relative 4 %   Monocytes Absolute 0.3 0.1 - 1.0 K/uL   Eosinophils Relative 2 %   Eosinophils Absolute 0.2 0.0 - 0.7 K/uL   Basophils Relative 0 %   Basophils Absolute 0.0 0.0 - 0.1 K/uL  Comprehensive metabolic panel   Collection Time: 05/23/17  6:37 PM  Result Value Ref Range   Sodium 135 135 - 145 mmol/L   Potassium 3.7 3.5 - 5.1 mmol/L   Chloride 107 101 - 111 mmol/L   CO2 22 22 - 32 mmol/L   Glucose, Bld 96 65 - 99 mg/dL   BUN 6 6 - 20 mg/dL   Creatinine, Ser 4.690.34 (L) 0.44 - 1.00 mg/dL   Calcium 8.8 (L) 8.9 - 10.3 mg/dL   Total Protein 7.3 6.5 - 8.1 g/dL   Albumin 3.7 3.5 - 5.0 g/dL   AST 44 (H) 15 - 41 U/L   ALT 41 14 - 54 U/L   Alkaline Phosphatase 67 38 - 126 U/L   Total Bilirubin 0.6 0.3 - 1.2 mg/dL   GFR calc non Af Amer >60 >60 mL/min   GFR calc Af Amer >60 >60 mL/min   Anion gap 6 5 - 15  Amylase   Collection Time: 05/23/17  6:37 PM  Result Value Ref Range   Amylase 41 28 - 100 U/L  Lipase, blood   Collection Time: 05/23/17  6:37 PM  Result Value Ref Range   Lipase 19 11 - 51 U/L    Imaging Studies:  No results found.  Assessment: - GERD  Plan: - Discharge home in stable condition, with return precautions  for worsening sx  - Rx Protonix   Thurnell LoseAlessandra G Tomasi, MS3   We did give her GI cocktail with good relief of pain We gave her Phenergan for nausea with good  relief but she was somnolent. Labs are normal, normal LFTs and amylase/lipase which indicate no acute pancreatitis or other condition Will d/c with Protonix for GERD  I confirm that I have verified the information documented in the Med Student's note and that I have also personally reperformed the physical exam and all medical decision making activities.

## 2017-05-23 NOTE — Discharge Instructions (Signed)
Acidez gstrica durante el embarazo (Heartburn During Pregnancy) La acidez se produce cuando el cido del estmago sube hacia el esfago. El esfago es el conducto que une la boca con el estmago. Este cido causa un dolor que quema en el pecho o la garganta. Esto ocurre ms frecuentemente en la etapa final del embarazo porque el vientre (tero) se agranda. Tambin puede producirse debido a cambios hormonales. Este problema generalmente desaparece despus del parto. CUIDADOS EN EL HOGAR  Tome todos los medicamentos segn las indicaciones de su mdico.  Eleve la cabecera de la cama con bloques segn le indique el mdico.  No haga ejercicios inmediatamente despus de comer.  Evite comer entre dos y treshoras antes de ir a acostarse. No se acueste inmediatamente despus de comer.  Haga comidas pequeas durante el da en lugar de 3 comidas abundantes.  Evite los alimentos que le hagan mal. Los alimentos que debe evitar son: ? Pimienta. ? Chocolate. ? Alimentos con alto contenido de grasas, incluyendo las comidas fritas ? Comidas muy condimentadas. ? Ajo y cebolla ? Ctricos, como naranjas, pomelos, limones y limas. ? Alimentos o productos que contengan tomate. ? Menta. ? Bebidas gaseosas (carbonatadas) y las que contengan cafena. ? Vinagre  SOLICITE AYUDA SI:  Siente dolor en el estmago (abdominal).  Siente ardor en la parte superior del estmago o el pecho, especialmente despus de comer o recostarse.  Tiene malestar estomacal (nuseas) y vomita.  Tiene malestar estomacal despus de comer.  SOLICITE AYUDA DE INMEDIATO SI:  Siente un dolor intenso en el pecho que baja por el brazo o va hacia la mandbula o el cuello.  Se siente mareado o sufre un desmayo.  Tiene dificultad para respirar.  Vomita sangre.  Tiene dificultad o dolor al tragar.  La materia fecal es negra o tiene sangre.  Tiene acidez ms de 3 veces por semana, durante ms de 2 semanas.  ASEGRESE DE  QUE:  Comprende estas instrucciones.  Controlar su afeccin.  Recibir ayuda de inmediato si no mejora o si empeora.  Esta informacin no tiene como fin reemplazar el consejo del mdico. Asegrese de hacerle al mdico cualquier pregunta que tenga. Document Released: 03/13/2011 Document Revised: 09/16/2013 Document Reviewed: 05/10/2016 Elsevier Interactive Patient Education  2017 Elsevier Inc.  

## 2017-05-23 NOTE — MAU Note (Signed)
Pt reports she started having n/v around 3 am. C/o severe epigastric pain as well.

## 2017-05-24 IMAGING — US US PELVIS COMPLETE
1 series · 14 of 25 positions shown · non-contrast
Comparison: Previous exams from 1798 were pregnancy related
ultrasound studies

CLINICAL DATA: Pelvic pain for 2 weeks. History of Caesarian
section.

EXAM:
TRANSABDOMINAL ULTRASOUND OF PELVIS
TECHNIQUE: Transabdominal ultrasound examination of the pelvis was performed
including evaluation of the uterus, ovaries, adnexal regions, and
pelvic cul-de-sac.

[Series 1: us pelvis complete · 0.28mm/px · 14 of 82 slices shown]
[im 1/82]
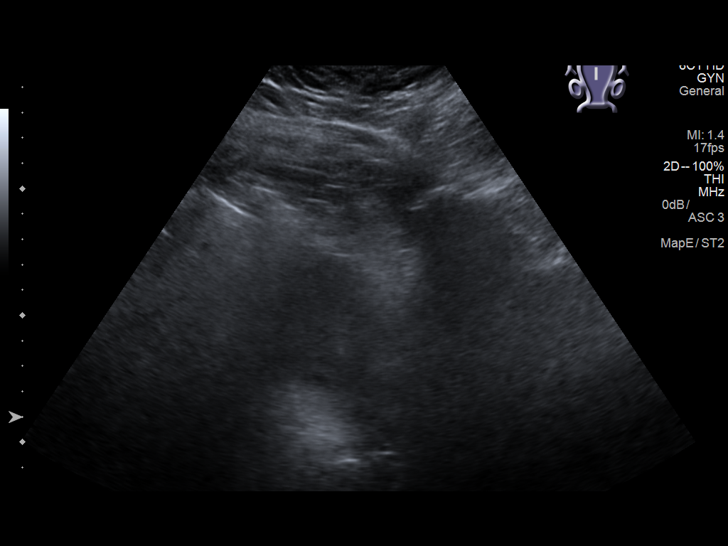
[im 7/82]
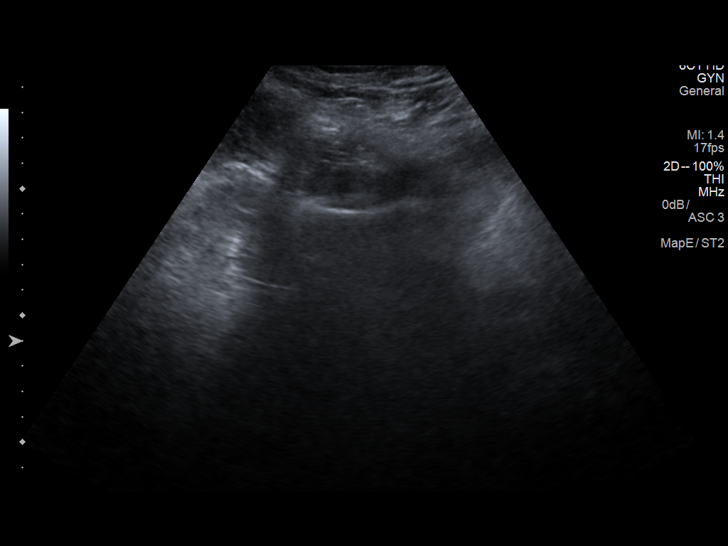
[im 14/82]
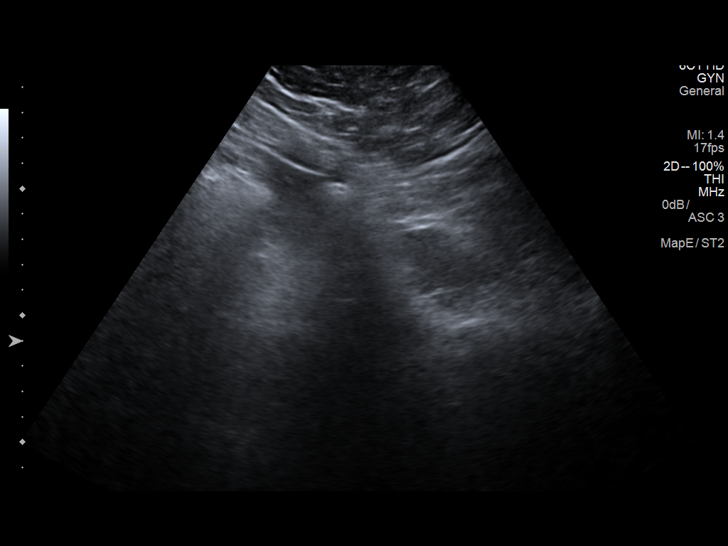
[im 21/82]
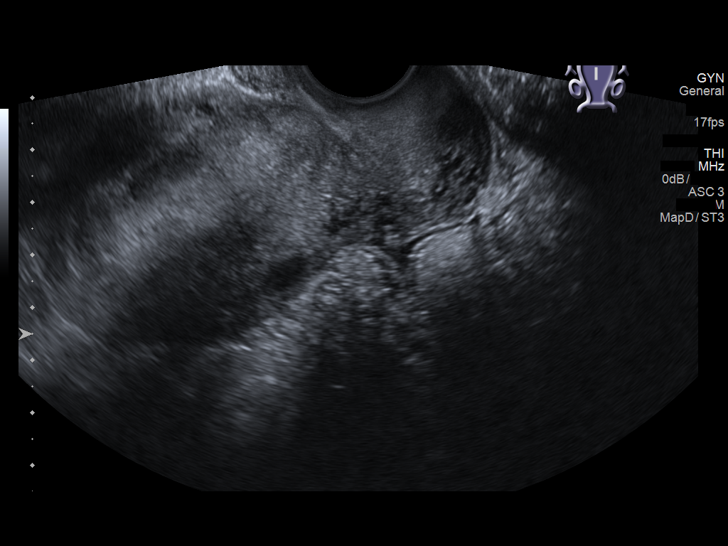
[im 28/82]
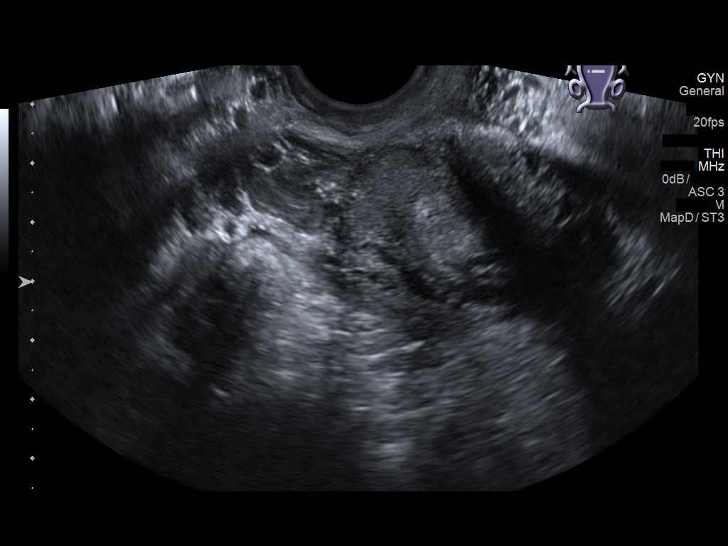
[im 31/82]
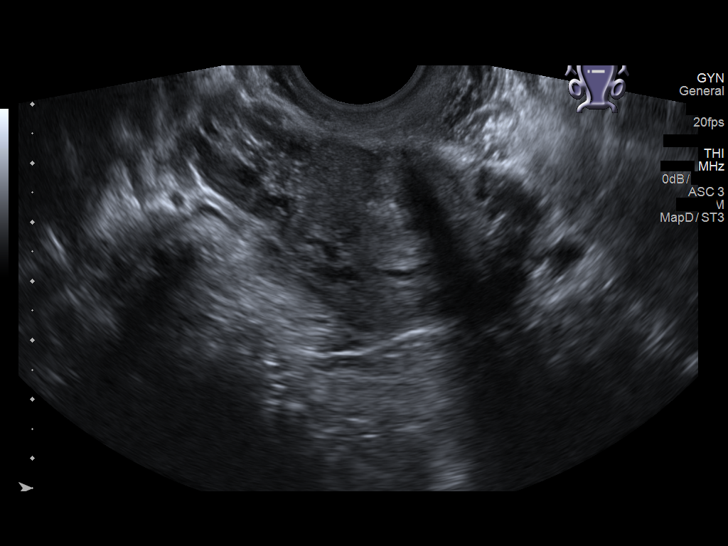
[im 38/82]
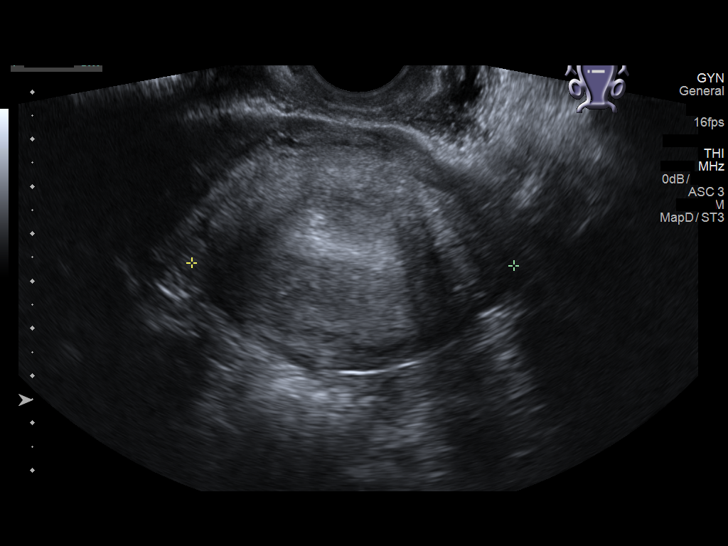
[im 44/82]
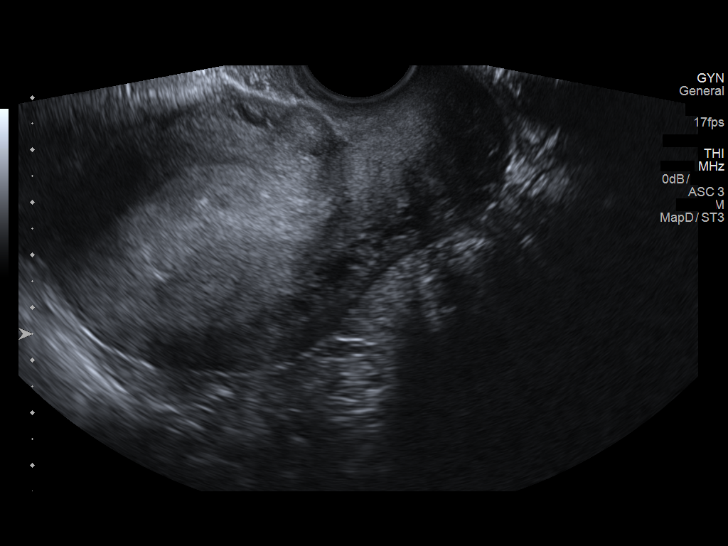
[im 51/82]
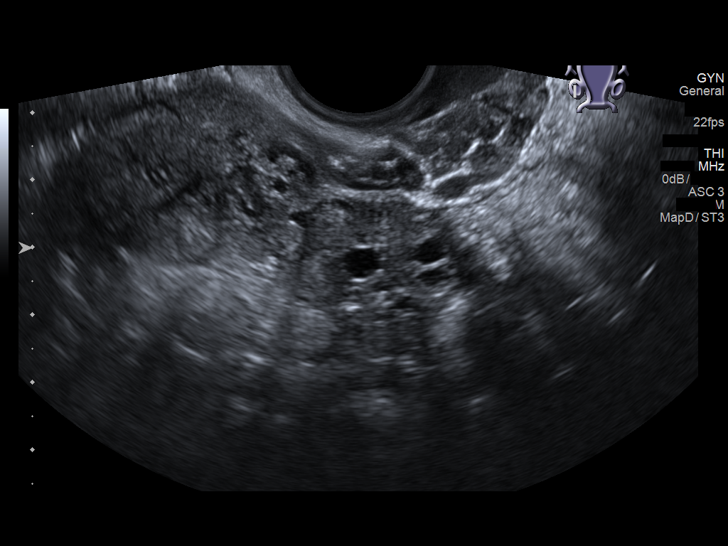
[im 55/82]
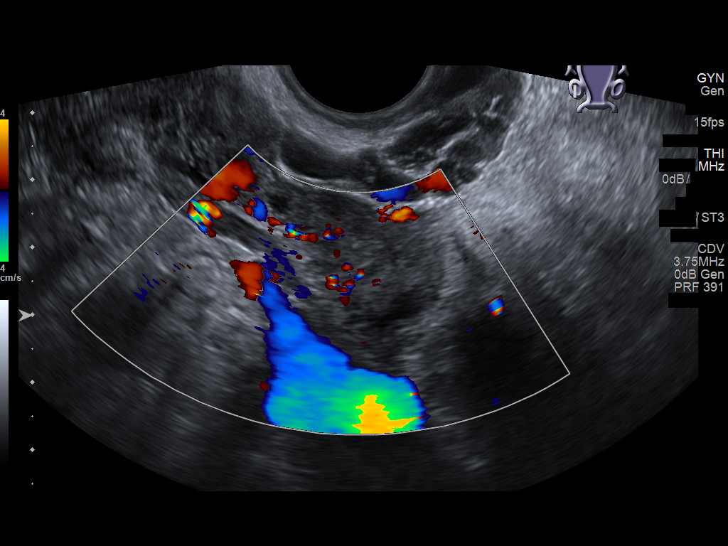
[im 61/82]
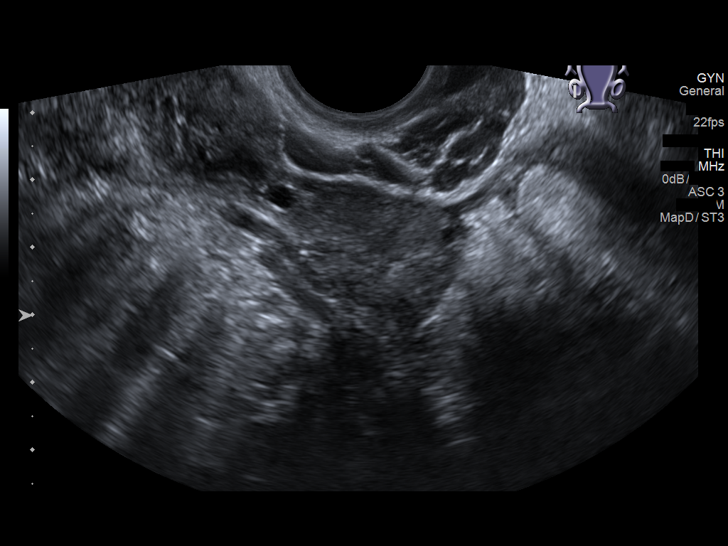
[im 68/82]
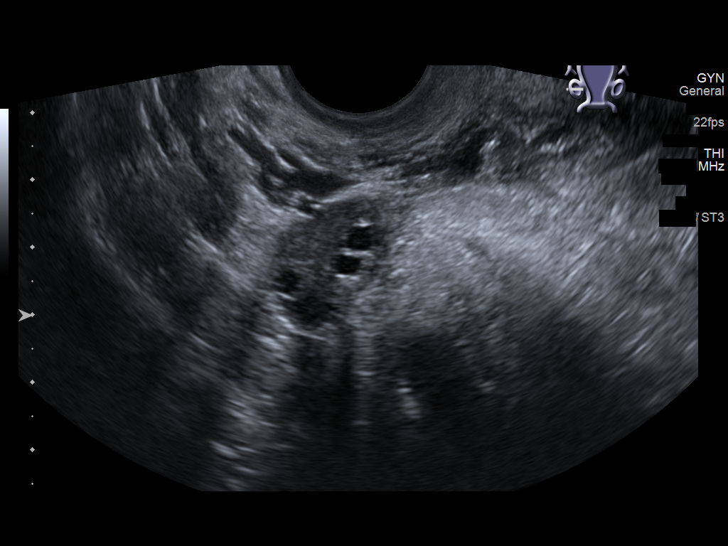
[im 75/82]
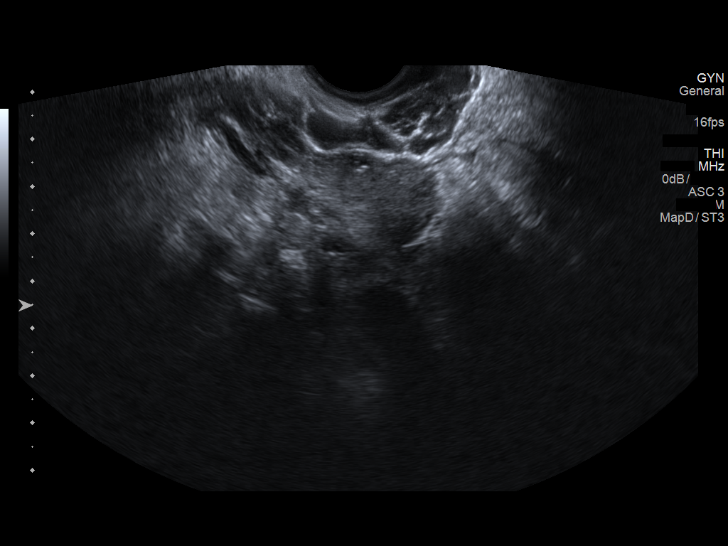
[im 82/82]
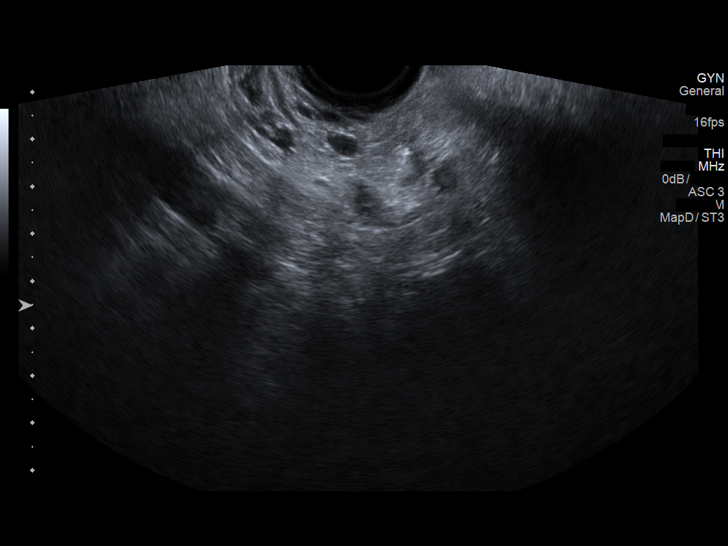

[14 of 25 positions shown; findings below may reference images not displayed]

FINDINGS: Uterus

Measurements: 9.5 x 5 x 6.8 cm. No fibroids or other mass
visualized. Nabothian cyst noted at the cervix measuring 0.5 x 0.3 x
0.5 cm. More

Endometrium

Thickness: 1.1 mm.  Normal appearance.

Right ovary

Measurements: 3.3 x 1.8 x 2.1 cm . Normal appearance/no adnexal
mass.

Left ovary

Measurements: The left ovary was obscured by bowel gas and not
visualized.

Other findings:  No abnormal free fluid.
IMPRESSION: No sonographic findings for the patient's pain. Left ovary was not
visualized due to overlying bowel gas.

## 2017-05-28 ENCOUNTER — Other Ambulatory Visit: Payer: Self-pay | Admitting: Obstetrics & Gynecology

## 2017-05-28 ENCOUNTER — Other Ambulatory Visit (HOSPITAL_COMMUNITY): Payer: Self-pay | Admitting: *Deleted

## 2017-05-28 ENCOUNTER — Encounter (HOSPITAL_COMMUNITY): Payer: Self-pay

## 2017-05-28 ENCOUNTER — Ambulatory Visit (HOSPITAL_COMMUNITY)
Admission: RE | Admit: 2017-05-28 | Discharge: 2017-05-28 | Disposition: A | Discharge status: Home / Self Care | Payer: Self-pay | Source: Ambulatory Visit | Attending: Obstetrics & Gynecology | Admitting: Obstetrics & Gynecology

## 2017-05-28 DIAGNOSIS — O4400 Placenta previa specified as without hemorrhage, unspecified trimester: Secondary | ICD-10-CM | POA: Insufficient documentation

## 2017-05-28 DIAGNOSIS — O09219 Supervision of pregnancy with history of pre-term labor, unspecified trimester: Secondary | ICD-10-CM

## 2017-05-28 DIAGNOSIS — Z3A23 23 weeks gestation of pregnancy: Secondary | ICD-10-CM | POA: Insufficient documentation

## 2017-05-29 ENCOUNTER — Ambulatory Visit (INDEPENDENT_AMBULATORY_CARE_PROVIDER_SITE_OTHER): Payer: Self-pay | Admitting: Obstetrics and Gynecology

## 2017-05-29 ENCOUNTER — Encounter: Payer: Self-pay | Admitting: Obstetrics and Gynecology

## 2017-05-29 VITALS — BP 118/64 | HR 84 | Wt 193.0 lb

## 2017-05-29 DIAGNOSIS — O10912 Unspecified pre-existing hypertension complicating pregnancy, second trimester: Secondary | ICD-10-CM

## 2017-05-29 DIAGNOSIS — O10919 Unspecified pre-existing hypertension complicating pregnancy, unspecified trimester: Secondary | ICD-10-CM

## 2017-05-29 DIAGNOSIS — O09212 Supervision of pregnancy with history of pre-term labor, second trimester: Secondary | ICD-10-CM

## 2017-05-29 DIAGNOSIS — O09892 Supervision of other high risk pregnancies, second trimester: Secondary | ICD-10-CM | POA: Insufficient documentation

## 2017-05-29 DIAGNOSIS — O0990 Supervision of high risk pregnancy, unspecified, unspecified trimester: Secondary | ICD-10-CM

## 2017-05-29 DIAGNOSIS — O0992 Supervision of high risk pregnancy, unspecified, second trimester: Secondary | ICD-10-CM

## 2017-05-29 LAB — POCT URINALYSIS DIP (DEVICE)
Bilirubin Urine: NEGATIVE
Glucose, UA: NEGATIVE mg/dL
Ketones, ur: NEGATIVE mg/dL
Nitrite: NEGATIVE
Protein, ur: 30 mg/dL — AB
Specific Gravity, Urine: 1.025 (ref 1.005–1.030)
Urobilinogen, UA: 0.2 mg/dL (ref 0.0–1.0)
pH: 6.5 (ref 5.0–8.0)

## 2017-05-29 MED ORDER — MICONAZOLE NITRATE 2 % VA CREA: 1.0000 | TOPICAL_CREAM | Freq: Every day | VAGINAL | 2 refills | Status: DC

## 2017-05-29 NOTE — Progress Notes (Signed)
C/o itching, white discharge Patient stated she speaks [place], but may need assistance. Spanish video interpreter [phone] "Sirleny" used, just in case of issues

## 2017-05-29 NOTE — Progress Notes (Signed)
Prenatal Visit Note Date: 05/29/2017 Clinic: [facility]  Subjective:  Jennifer Holmes is a 31 y.o. [id number] at 6546w4d being seen today for ongoing prenatal care.  She is currently monitored for the following issues for this high-risk pregnancy and has History of fatty infiltration of liver; Chronic hypertension in pregnancy; History of HELLP syndrome, currently pregnant; Supervision of high risk pregnancy, antepartum; History of vaginal delivery following previous cesarean delivery; History of cesarean delivery, currently pregnant; and History of preterm delivery, currently pregnant in second trimester on her problem list.  Patient reports VVC s/s. see RN note.   Contractions: Irritability. Vag. Bleeding: None.  Movement: Present. Denies leaking of fluid.   The following portions of the patient's history were reviewed and updated as appropriate: allergies, current medications, past family history, past medical history, past social history, past surgical history and problem list. Problem list updated.  Objective:   Vitals:   05/29/17 1054  BP: 118/64  Pulse: 84  Weight: 193 lb (87.5 kg)    Fetal Status: Fetal Heart Rate (bpm): 150   Movement: Present     General:  Alert, oriented and cooperative. Patient is in no acute distress.  Skin: Skin is warm and dry. No rash noted.   Cardiovascular: Normal heart rate noted  Respiratory: Normal respiratory effort, no problems with respiration noted  Abdomen: Soft, gravid, appropriate for gestational age. Pain/Pressure: Present    EGBUS with white cottage cheese like d/c at introitus and some erythema  Pelvic:  Cervical exam deferred        Extremities: Normal range of motion.  Edema: Trace  Mental Status: Normal mood and affect. Normal behavior. Normal judgment and thought content.   Urinalysis:      Assessment and Plan:  Pregnancy: [id number] at 8146w4d   Routine care. Normal growth, anatomy scan yesterday; previa  cleared.  Continue labetalol 100 bid and baby asa. Needs repeat growth in 4-6wks Not offered 17p. Normal CL yesterday. Has rpt CL in 2wks OTC monistat 7   Preterm labor symptoms and general obstetric precautions including but not limited to vaginal bleeding, contractions, leaking of fluid and fetal movement were reviewed in detail with the patient. Please refer to After Visit Summary for other counseling recommendations.  Return in about 2 weeks (around 06/12/2017) for rob. try and do on same day as mfm u/s for pt ease. [place].   Christiann Hagerty, MD

## 2017-06-04 ENCOUNTER — Telehealth: Payer: Self-pay | Admitting: General Practice

## 2017-06-04 NOTE — Telephone Encounter (Signed)
Called patient with interpreter [phone] & informed her of results. Patient verbalized understanding and states she was already aware of that. Patient had no questions

## 2017-06-04 NOTE — Telephone Encounter (Signed)
-----   Message from Lesly DukesKelly H Leggett, MD sent at 06/03/2017  2:01 PM EDT ----- Pt no longer has placenta previa   She can have intercourse now.  Please call patient with interpreter and inform her.  Thanks!

## 2017-06-11 ENCOUNTER — Ambulatory Visit (HOSPITAL_COMMUNITY)
Admission: RE | Admit: 2017-06-11 | Discharge: 2017-06-11 | Disposition: A | Discharge status: Home / Self Care | Payer: Self-pay | Source: Ambulatory Visit | Attending: Obstetrics & Gynecology | Admitting: Obstetrics & Gynecology

## 2017-06-11 ENCOUNTER — Encounter (HOSPITAL_COMMUNITY): Payer: Self-pay

## 2017-06-17 ENCOUNTER — Encounter: Payer: Self-pay | Admitting: Obstetrics & Gynecology

## 2017-06-17 ENCOUNTER — Ambulatory Visit (INDEPENDENT_AMBULATORY_CARE_PROVIDER_SITE_OTHER): Payer: Self-pay | Admitting: Obstetrics & Gynecology

## 2017-06-17 VITALS — BP 125/78 | HR 75 | Wt 196.9 lb

## 2017-06-17 DIAGNOSIS — Z23 Encounter for immunization: Secondary | ICD-10-CM

## 2017-06-17 DIAGNOSIS — O09292 Supervision of pregnancy with other poor reproductive or obstetric history, second trimester: Secondary | ICD-10-CM

## 2017-06-17 DIAGNOSIS — O10919 Unspecified pre-existing hypertension complicating pregnancy, unspecified trimester: Secondary | ICD-10-CM

## 2017-06-17 DIAGNOSIS — O0990 Supervision of high risk pregnancy, unspecified, unspecified trimester: Secondary | ICD-10-CM

## 2017-06-17 DIAGNOSIS — O34219 Maternal care for unspecified type scar from previous cesarean delivery: Secondary | ICD-10-CM

## 2017-06-17 DIAGNOSIS — O10912 Unspecified pre-existing hypertension complicating pregnancy, second trimester: Secondary | ICD-10-CM

## 2017-06-17 DIAGNOSIS — O09299 Supervision of pregnancy with other poor reproductive or obstetric history, unspecified trimester: Secondary | ICD-10-CM

## 2017-06-17 DIAGNOSIS — O0992 Supervision of high risk pregnancy, unspecified, second trimester: Secondary | ICD-10-CM

## 2017-06-17 MED ORDER — TETANUS-DIPHTH-ACELL PERTUSSIS 5-2.5-18.5 LF-MCG/0.5 IM SUSP
0.5000 mL | Freq: Once | INTRAMUSCULAR | Status: AC
  Administered 2017-06-17: 0.5 mL via INTRAMUSCULAR

## 2017-06-17 NOTE — Progress Notes (Signed)
Video Interperter # [id number] Rescheduled OB US for July 18th @ 0800.  Pt notified.     PRENATAL VISIT NOTE  Subjective:  Jennifer Holmes is a 31 y.o. G3P1102 at 2944w2d being seen today for ongoing prenatal care.  She is currently monitored for the following issues for this high-risk pregnancy and has History of fatty infiltration of liver; Chronic hypertension in pregnancy; History of HELLP syndrome, currently pregnant; Supervision of high risk pregnancy, antepartum; History of vaginal delivery following previous cesarean delivery; History of cesarean delivery, currently pregnant; and History of preterm delivery, currently pregnant in second trimester on her problem list.  Patient reports no complaints.  Contractions: Not present. Vag. Bleeding: None.  Movement: Present. Denies leaking of fluid.   The following portions of the patient's history were reviewed and updated as appropriate: allergies, current medications, past family history, past medical history, past social history, past surgical history and problem list. Problem list updated.  Objective:   Vitals:   06/17/17 1038  BP: 125/78  Pulse: 75  Weight: 196 lb 14.4 oz (89.3 kg)    Fetal Status: Fetal Heart Rate (bpm): 141   Movement: Present     General:  Alert, oriented and cooperative. Patient is in no acute distress.  Skin: Skin is warm and dry. No rash noted.   Cardiovascular: Normal heart rate noted  Respiratory: Normal respiratory effort, no problems with respiration noted  Abdomen: Soft, gravid, appropriate for gestational age. Pain/Pressure: Absent     Pelvic:  Cervical exam deferred        Extremities: Normal range of motion.  Edema: Trace  Mental Status: Normal mood and affect. Normal behavior. Normal judgment and thought content.   Assessment and Plan:  Pregnancy: G3P1102 at 6144w2d  1. Supervision of high risk pregnancy, antepartum 28 weeks labs and tdap Contraception choices given.  2. History of HELLP  syndrome, currently pregnant cmp today  3. History of cesarean delivery, currently pregnant Wants VBAC, consent signed today--should be scanned.  4. Chronic hypertension in pregnancy Continue ASA and labetalol  Preterm labor symptoms and general obstetric precautions including but not limited to vaginal bleeding, contractions, leaking of fluid and fetal movement were reviewed in detail with the patient. Please refer to After Visit Summary for other counseling recommendations.   RTC 2 weeks.  Elsie LincolnKelly Leggett, MD

## 2017-06-17 NOTE — Patient Instructions (Signed)
Elección del método anticonceptivo  (Contraception Choices)  La anticoncepción (control de la natalidad) es el uso de cualquier método o dispositivo para evitar el embarazo. A continuación se indican algunos de esos métodos.  ANTICONCEPTIVOS HORMONALES  · Un pequeño tubo colocado bajo la piel de la parte superior del brazo (implante). El tubo puede permanecer en el lugar durante 3 años. El implante debe quitarse después de 3 años.  · Inyecciones que se aplican cada 3 meses.  · Píldoras que deben tomarse todos los días.  · Parches que se cambian una vez por semana.  · Un anillo que se coloca en la vagina (anillo vaginal). El anillo se deja en su lugar durante 3 semanas y se retira durante 1 semana. Luego se coloca un nuevo anillo en la vagina.  · Píldoras para el control de la natalidad después de tener sexo (relaciones sexuales) sin protección.    ANTICONCEPTIVOS DE BARRERA  · Una cubierta delgada que se usa sobe el pene (condón masculino) que se coloca durante las relaciones sexuales.  · Una cubierta blanda y suelta que se coloca en la vagina (condón femenino) antes de las relaciones sexuales.  · Un dispositivo de goma que se aplica sobre el cuello del útero (diafragma). Este dispositivo debe ser hecho para usted. Se coloca en la vagina antes de tener relaciones sexuales. Debe dejarlo colocado en la vagina durante 6 a 8 horas después de las relaciones sexuales.  · Un capuchón pequeño y suave que se fija sobre el cuello del útero (capuchón cervical). Este capuchón debe ser hecho para usted. Debe dejarlo colocado en la vagina durante 48 horas después de las relaciones sexuales.  · Una esponja que se coloca en la vagina antes de tener relaciones sexuales.  · Una sustancia química que destruye o impide que los espermatozoides ingresen al cuello y al útero (espermicida). La sustancia química puede ser en crema, gel, espuma o píldoras.    DISPOSITIVO DE CONTROL INTRAUTERINO (DIU)   · El DIU es un pequeño dispositivo plástico en forma de T. Se coloca dentro del útero. Hay dos tipos de DIU:  ? DIU de cobre. El dispositivo está recubierto en alambre de cobre. El cobre produce un líquido que destruye los espermatozoides. Puede permanecer colocado durante 10 años.  ? DIU hormonal. La hormona impide que ocurra el embarazo. Puede permanecer colocado durante 5 años.    MÉTODOS PERMANENTES  · La mujer puede hacerse sellar, ligar u obstruir las trompas de Falopio durante una cirugía. Esto impide que el óvulo llegue hasta el útero.  · El médico coloca un alambre diminuto o lo inserta en cada una de las trompas de Falopio. Esto produce un tejido cicatrizal que obstruye las trompas de Falopio.  · En el hombre pueden ligarse los conductos por los que pasan los espermatozoides (vasectomía).    CONTROL DE LA NATALIDAD POR PLANIFICACIÓN FAMILIAR NATURAL  · La planificación familiar natural significa no tener relaciones sexuales o usar un método anticonceptivo de barrera en los períodos fértiles de la mujer.  · Use a un almanaque para llevar un registro de la extensión de cada período y para conocer los días en los que puede quedar embarazada.  · Evite tener relaciones sexuales durante la ovulación.  · Use un termómetro para medir la temperatura corporal. También reconozca los síntomas de la ovulación.  · El momento de tener relaciones sexuales debe ser después de que la mujer haya ovulado.  Use condones para protegerse de las enfermedades de transmisión   sexual (ETS). Hágalo independientemente del tipo de anticonceptivo que use. Hable con su médico acerca de cuál es el mejor método anticonceptivo para usted.  Esta información no tiene como fin reemplazar el consejo del médico. Asegúrese de hacerle al médico cualquier pregunta que tenga.  Document Released: 03/13/2011 Document Revised: 12/01/2013 Document Reviewed: 06/17/2013  Elsevier Interactive Patient Education © 2017 [employer]

## 2017-06-18 LAB — GLUCOSE TOLERANCE, 2 HOURS W/ 1HR
Glucose, 1 hour: 173 mg/dL (ref 65–179)
Glucose, 2 hour: 118 mg/dL (ref 65–152)
Glucose, Fasting: 79 mg/dL (ref 65–91)

## 2017-06-18 LAB — COMPREHENSIVE METABOLIC PANEL
ALT: 27 IU/L (ref 0–32)
AST: 31 IU/L (ref 0–40)
Albumin/Globulin Ratio: 1.5 (ref 1.2–2.2)
Albumin: 3.8 g/dL (ref 3.5–5.5)
Alkaline Phosphatase: 76 IU/L (ref 39–117)
BUN/Creatinine Ratio: 17 (ref 9–23)
BUN: 7 mg/dL (ref 6–20)
Bilirubin Total: 0.3 mg/dL (ref 0.0–1.2)
CO2: 21 mmol/L (ref 20–29)
Calcium: 8.6 mg/dL — ABNORMAL LOW (ref 8.7–10.2)
Chloride: 102 mmol/L (ref 96–106)
Creatinine, Ser: 0.42 mg/dL — ABNORMAL LOW (ref 0.57–1.00)
GFR calc Af Amer: 160 mL/min/{1.73_m2} (ref >59)
GFR calc non Af Amer: 139 mL/min/{1.73_m2} (ref >59)
Globulin, Total: 2.6 g/dL (ref 1.5–4.5)
Glucose: 172 mg/dL — ABNORMAL HIGH (ref 65–99)
Potassium: 4 mmol/L (ref 3.5–5.2)
Sodium: 137 mmol/L (ref 134–144)
Total Protein: 6.4 g/dL (ref 6.0–8.5)

## 2017-06-18 LAB — CBC
Hematocrit: 37.7 % (ref 34.0–46.6)
Hemoglobin: 12.7 g/dL (ref 11.1–15.9)
MCH: 31.7 pg (ref 26.6–33.0)
MCHC: 33.7 g/dL (ref 31.5–35.7)
MCV: 94 fL (ref 79–97)
Platelets: 306 10*3/uL (ref 150–379)
RBC: 4.01 x10E6/uL (ref 3.77–5.28)
RDW: 14.1 % (ref 12.3–15.4)
WBC: 7.1 10*3/uL (ref 3.4–10.8)

## 2017-06-18 LAB — RPR: RPR Ser Ql: NONREACTIVE

## 2017-06-18 LAB — HIV ANTIBODY (ROUTINE TESTING W REFLEX): HIV Screen 4th Generation wRfx: NONREACTIVE

## 2017-06-19 ENCOUNTER — Encounter: Payer: Self-pay | Admitting: *Deleted

## 2017-06-26 ENCOUNTER — Other Ambulatory Visit (HOSPITAL_COMMUNITY): Payer: Self-pay | Admitting: *Deleted

## 2017-06-26 ENCOUNTER — Encounter (HOSPITAL_COMMUNITY): Payer: Self-pay

## 2017-06-26 ENCOUNTER — Other Ambulatory Visit (HOSPITAL_COMMUNITY): Payer: Self-pay | Admitting: Obstetrics and Gynecology

## 2017-06-26 ENCOUNTER — Ambulatory Visit (HOSPITAL_COMMUNITY)
Admission: RE | Admit: 2017-06-26 | Discharge: 2017-06-26 | Disposition: A | Discharge status: Home / Self Care | Payer: Self-pay | Source: Ambulatory Visit | Attending: Obstetrics & Gynecology | Admitting: Obstetrics & Gynecology

## 2017-06-26 DIAGNOSIS — O10913 Unspecified pre-existing hypertension complicating pregnancy, third trimester: Secondary | ICD-10-CM

## 2017-06-26 DIAGNOSIS — Z3A27 27 weeks gestation of pregnancy: Secondary | ICD-10-CM

## 2017-06-26 DIAGNOSIS — O09212 Supervision of pregnancy with history of pre-term labor, second trimester: Secondary | ICD-10-CM | POA: Insufficient documentation

## 2017-06-26 DIAGNOSIS — O34219 Maternal care for unspecified type scar from previous cesarean delivery: Secondary | ICD-10-CM

## 2017-06-26 DIAGNOSIS — O09299 Supervision of pregnancy with other poor reproductive or obstetric history, unspecified trimester: Secondary | ICD-10-CM

## 2017-06-26 DIAGNOSIS — O34211 Maternal care for low transverse scar from previous cesarean delivery: Secondary | ICD-10-CM | POA: Insufficient documentation

## 2017-06-26 DIAGNOSIS — O99212 Obesity complicating pregnancy, second trimester: Secondary | ICD-10-CM

## 2017-06-26 DIAGNOSIS — O10012 Pre-existing essential hypertension complicating pregnancy, second trimester: Secondary | ICD-10-CM

## 2017-06-26 DIAGNOSIS — O09292 Supervision of pregnancy with other poor reproductive or obstetric history, second trimester: Secondary | ICD-10-CM | POA: Insufficient documentation

## 2017-06-26 DIAGNOSIS — O09219 Supervision of pregnancy with history of pre-term labor, unspecified trimester: Secondary | ICD-10-CM

## 2017-07-08 ENCOUNTER — Encounter: Payer: Self-pay | Admitting: Obstetrics & Gynecology

## 2017-07-08 ENCOUNTER — Ambulatory Visit (INDEPENDENT_AMBULATORY_CARE_PROVIDER_SITE_OTHER): Payer: Self-pay | Admitting: Obstetrics & Gynecology

## 2017-07-08 VITALS — BP 126/83 | HR 92 | Wt 200.8 lb

## 2017-07-08 DIAGNOSIS — O4692 Antepartum hemorrhage, unspecified, second trimester: Secondary | ICD-10-CM

## 2017-07-08 DIAGNOSIS — O10919 Unspecified pre-existing hypertension complicating pregnancy, unspecified trimester: Secondary | ICD-10-CM

## 2017-07-08 DIAGNOSIS — Z113 Encounter for screening for infections with a predominantly sexual mode of transmission: Secondary | ICD-10-CM

## 2017-07-08 DIAGNOSIS — O10912 Unspecified pre-existing hypertension complicating pregnancy, second trimester: Secondary | ICD-10-CM

## 2017-07-08 DIAGNOSIS — O0992 Supervision of high risk pregnancy, unspecified, second trimester: Secondary | ICD-10-CM

## 2017-07-08 DIAGNOSIS — O34219 Maternal care for unspecified type scar from previous cesarean delivery: Secondary | ICD-10-CM

## 2017-07-08 DIAGNOSIS — Z98891 History of uterine scar from previous surgery: Secondary | ICD-10-CM

## 2017-07-08 DIAGNOSIS — O0990 Supervision of high risk pregnancy, unspecified, unspecified trimester: Secondary | ICD-10-CM

## 2017-07-08 DIAGNOSIS — O09299 Supervision of pregnancy with other poor reproductive or obstetric history, unspecified trimester: Secondary | ICD-10-CM

## 2017-07-08 DIAGNOSIS — O09292 Supervision of pregnancy with other poor reproductive or obstetric history, second trimester: Secondary | ICD-10-CM

## 2017-07-08 LAB — FETAL FIBRONECTIN: Fetal Fibronectin: NEGATIVE

## 2017-07-08 NOTE — Progress Notes (Signed)
   PRENATAL VISIT NOTE  Subjective:  Jennifer Holmes is a 31 y.o. G3P1102 at 513w2d being seen today for ongoing prenatal care.  She is currently monitored for the following issues for this high-risk pregnancy and has History of fatty infiltration of liver; Chronic hypertension in pregnancy; History of HELLP syndrome, currently pregnant; Supervision of high risk pregnancy, antepartum; History of vaginal delivery following previous cesarean delivery; History of cesarean delivery, currently pregnant; and History of preterm delivery, currently pregnant in second trimester on her problem list.  Patient reports no complaints. Denies leaking of fluid. + Contractions since last Thursday.  No intercourse.    The following portions of the patient's history were reviewed and updated as appropriate: allergies, current medications, past family history, past medical history, past social history, past surgical history and problem list. Problem list updated.  Objective:  There were no vitals filed for this visit.  Fetal Status:           General:  Alert, oriented and cooperative. Patient is in no acute distress.  Skin: Skin is warm and dry. No rash noted.   Cardiovascular: Normal heart rate noted  Respiratory: Normal respiratory effort, no problems with respiration noted  Abdomen: Soft, gravid, appropriate for gestational age.        Pelvic: Cervical exam performed        Extremities: Normal range of motion.     Mental Status:  Normal mood and affect. Normal behavior. Normal judgment and thought content.   Assessment and Plan:  Pregnancy: G3P1102 at 263w2d  1. Chronic hypertension in pregnancy Nml BP with labetalol  2. History of HELLP syndrome, currently pregnant Nml CMP at 28 weks  3. Supervision of high risk pregnancy, antepartum Having some contractions for past few days.  None today.  FFN sent; cervix soft.  External os 1 cm, internal os closed.  Cervix still long.  Some spotting on  exam.  Pt having some vaginal itching. Wet prpep sent.  Pt has already bought Monistat and wants to use it.    4. History of vaginal delivery following previous cesarean delivery VBAC consent under media  Preterm labor symptoms and general obstetric precautions including but not limited to vaginal bleeding, contractions, leaking of fluid and fetal movement were reviewed in detail with the patient. Please refer to After Visit Summary for other counseling recommendations.  Return in about 2 weeks (around 07/22/2017).   Elsie LincolnKelly Dimples Probus, MD

## 2017-07-09 LAB — CERVICOVAGINAL ANCILLARY ONLY
Bacterial vaginitis: POSITIVE — AB
Candida vaginitis: POSITIVE — AB
Chlamydia: NEGATIVE
Neisseria Gonorrhea: NEGATIVE
Trichomonas: NEGATIVE

## 2017-07-11 ENCOUNTER — Telehealth: Payer: Self-pay | Admitting: General Practice

## 2017-07-11 DIAGNOSIS — B379 Candidiasis, unspecified: Secondary | ICD-10-CM

## 2017-07-11 DIAGNOSIS — N760 Acute vaginitis: Secondary | ICD-10-CM

## 2017-07-11 DIAGNOSIS — B9689 Other specified bacterial agents as the cause of diseases classified elsewhere: Secondary | ICD-10-CM

## 2017-07-11 MED ORDER — TERCONAZOLE 0.4 % VA CREA: 1.0000 | TOPICAL_CREAM | Freq: Every day | VAGINAL | 0 refills | Status: DC

## 2017-07-11 MED ORDER — METRONIDAZOLE 500 MG PO TABS: 500.0000 mg | ORAL_TABLET | Freq: Two times a day (BID) | ORAL | 0 refills | Status: DC

## 2017-07-11 NOTE — Telephone Encounter (Signed)
-----   Message from Lesly DukesKelly H Leggett, MD sent at 07/11/2017  2:05 PM EDT ----- Pt can be given terazol 5 and flagyl per protocol

## 2017-07-11 NOTE — Telephone Encounter (Signed)
Called patient with [place] interpreter [phone] and informed patient of results & prescriptions sent to pharmacy. Patient verbalized understanding & had no questions

## 2017-07-15 ENCOUNTER — Inpatient Hospital Stay (HOSPITAL_COMMUNITY)
Admission: AD | Admit: 2017-07-15 | Discharge: 2017-07-15 | Disposition: A | Discharge status: Home / Self Care | Payer: Self-pay | Source: Ambulatory Visit | Attending: Obstetrics & Gynecology | Admitting: Obstetrics & Gynecology

## 2017-07-15 ENCOUNTER — Encounter (HOSPITAL_COMMUNITY): Payer: Self-pay | Admitting: *Deleted

## 2017-07-15 DIAGNOSIS — O4693 Antepartum hemorrhage, unspecified, third trimester: Secondary | ICD-10-CM

## 2017-07-15 DIAGNOSIS — Z8719 Personal history of other diseases of the digestive system: Secondary | ICD-10-CM

## 2017-07-15 DIAGNOSIS — O10919 Unspecified pre-existing hypertension complicating pregnancy, unspecified trimester: Secondary | ICD-10-CM

## 2017-07-15 DIAGNOSIS — O4703 False labor before 37 completed weeks of gestation, third trimester: Secondary | ICD-10-CM

## 2017-07-15 DIAGNOSIS — O09892 Supervision of other high risk pregnancies, second trimester: Secondary | ICD-10-CM

## 2017-07-15 DIAGNOSIS — R82998 Other abnormal findings in urine: Secondary | ICD-10-CM

## 2017-07-15 DIAGNOSIS — Z3A30 30 weeks gestation of pregnancy: Secondary | ICD-10-CM | POA: Insufficient documentation

## 2017-07-15 DIAGNOSIS — R1032 Left lower quadrant pain: Secondary | ICD-10-CM | POA: Insufficient documentation

## 2017-07-15 DIAGNOSIS — O09212 Supervision of pregnancy with history of pre-term labor, second trimester: Secondary | ICD-10-CM

## 2017-07-15 DIAGNOSIS — O10013 Pre-existing essential hypertension complicating pregnancy, third trimester: Secondary | ICD-10-CM | POA: Insufficient documentation

## 2017-07-15 LAB — URINALYSIS, ROUTINE W REFLEX MICROSCOPIC
Bilirubin Urine: NEGATIVE
Glucose, UA: NEGATIVE mg/dL
Ketones, ur: NEGATIVE mg/dL
Nitrite: NEGATIVE
Protein, ur: 30 mg/dL — AB
Specific Gravity, Urine: 1.011 (ref 1.005–1.030)
pH: 7 (ref 5.0–8.0)

## 2017-07-15 LAB — OB RESULTS CONSOLE GBS: GBS: NEGATIVE

## 2017-07-15 MED ORDER — BETAMETHASONE SOD PHOS & ACET 6 (3-3) MG/ML IJ SUSP
12.0000 mg | Freq: Once | INTRAMUSCULAR | Status: AC
  Administered 2017-07-15: 12 mg via INTRAMUSCULAR
  Filled 2017-07-15: qty 2

## 2017-07-15 MED ORDER — NIFEDIPINE 10 MG PO CAPS
10.0000 mg | ORAL_CAPSULE | Freq: Once | ORAL | Status: DC
  Filled 2017-07-15: qty 1

## 2017-07-15 MED ORDER — ONDANSETRON HCL 4 MG/2ML IJ SOLN
4.0000 mg | Freq: Once | INTRAMUSCULAR | Status: AC
  Administered 2017-07-15: 4 mg via INTRAVENOUS
  Filled 2017-07-15: qty 2

## 2017-07-15 MED ORDER — LACTATED RINGERS IV SOLN
INTRAVENOUS | Status: DC
  Administered 2017-07-15: 15:00:00 via INTRAVENOUS

## 2017-07-15 MED ORDER — CEPHALEXIN 500 MG PO CAPS: 500.0000 mg | ORAL_CAPSULE | Freq: Four times a day (QID) | ORAL | 0 refills | Status: DC

## 2017-07-15 MED ORDER — NIFEDIPINE 10 MG PO CAPS
10.0000 mg | ORAL_CAPSULE | Freq: Once | ORAL | Status: AC
  Administered 2017-07-15: 10 mg via ORAL
  Filled 2017-07-15: qty 1

## 2017-07-15 NOTE — MAU Note (Signed)
Pt reports she started having some bleeding and that has continued, started having contractions at 4 this am and they have gotten closer.

## 2017-07-15 NOTE — MAU Provider Note (Signed)
Chief Complaint:  Abdominal Pain and Vaginal Bleeding   First Provider Initiated Contact with Patient 07/15/17 1206     HPI: Jennifer Holmes is a 31 y.o. G3P1102 at 2930w2dwho presents to maternity admissions reporting vaginal bleeding and painful contractions.   Started yesterday and last night.  Less contractions today.  . She reports good fetal movement, denies LOF, vaginal itching/burning, urinary symptoms, h/a, dizziness, n/v, diarrhea, constipation or fever/chills.  .  Abdominal Pain  This is a new problem. The current episode started yesterday. The onset quality is gradual. The problem occurs intermittently. The problem has been gradually improving. The pain is located in the LLQ, RLQ and suprapubic region. The quality of the pain is aching and cramping. The abdominal pain does not radiate. Pertinent negatives include no constipation, diarrhea, dysuria, fever, headaches, myalgias, nausea or vomiting. Nothing aggravates the pain. The pain is relieved by nothing. She has tried nothing for the symptoms.  Vaginal Bleeding  The patient's primary symptoms include pelvic pain and vaginal bleeding. The patient's pertinent negatives include no genital itching, genital lesions or genital odor. This is a new problem. The current episode started yesterday. The problem occurs intermittently. The pain is moderate. The problem affects both sides. She is pregnant. Associated symptoms include abdominal pain. Pertinent negatives include no constipation, diarrhea, dysuria, fever, headaches, nausea or vomiting. The vaginal discharge was bloody. The vaginal bleeding is lighter than menses. She has not been passing clots. She has not been passing tissue. Nothing aggravates the symptoms. She has tried nothing for the symptoms.    RN Note: Pt reports she started having some bleeding and that has continued, started having contractions at 4 this am and they have gotten closer.    Past Medical History: Past  Medical History:  Diagnosis Date  . Blood transfusion without reported diagnosis   . Fatty liver    per patient with last pregancy  . GERD (gastroesophageal reflux disease)   . Headache   . Hypertension   . PONV (postoperative nausea and vomiting)   . Preterm labor     Past obstetric history: OB History  Gravida Para Term Preterm AB Living  3 2 1 1  0 2  SAB TAB Ectopic Multiple Live Births  0 0 0 0 2    # Outcome Date GA Lbr Len/2nd Weight Sex Delivery Anes PTL Lv  3 Current           2 Preterm 01/24/15 5168w1d  6 lb 15.6 oz (3.165 kg) F VBAC Local  LIV     Birth Comments: Born at 34 weeks, maternal HTN, vaginal delivery, jaundice at birth - phototherapy, home after 3 days.  1 Term 03/01/08 4252w0d  8 lb (3.629 kg) M CS-Unspec Spinal  LIV     Birth Comments: c/s due to mother's heart problems; pp hemorrhage     Obstetric Comments  Had a blood transfusion with first pregnancy [place]    Past Surgical History: Past Surgical History:  Procedure Laterality Date  . CESAREAN SECTION      Family History: Family History  Problem Relation Age of Onset  . Hypertension Mother   . Heart disease Mother   . Hypertension Brother     Social History: Social History  Substance Use Topics  . Smoking status: Never Smoker  . Smokeless tobacco: Never Used  . Alcohol use No    Allergies:  Allergies  Allergen Reactions  . Lactose Intolerance (Gi) Other (See Comments)  Reaction:  GI upset    Meds:  Prescriptions Prior to Admission  Medication Sig Dispense Refill Last Dose  . aspirin EC 81 MG tablet Take 1 tablet (81 mg total) by mouth daily. 90 tablet 3 Taking  . Cetirizine HCl 10 MG CAPS Take 1 capsule (10 mg total) by mouth daily. (Patient not taking: Reported on 05/28/2017) 30 capsule 3 Not Taking  . labetalol (NORMODYNE) 200 MG tablet Take 0.5 tablets (100 mg total) by mouth 2 (two) times daily. 60 tablet 3 Taking  . metoCLOPramide (REGLAN) 10 MG tablet  Take 1 tablet (10 mg total) by mouth 4 (four) times daily. (Patient not taking: Reported on 07/08/2017) 60 tablet 1 Not Taking  . metroNIDAZOLE (FLAGYL) 500 MG tablet Take 1 tablet (500 mg total) by mouth 2 (two) times daily. 14 tablet 0   . miconazole (MONISTAT 7) 2 % vaginal cream Place 1 Applicatorful vaginally at bedtime. Apply for seven nights (Patient not taking: Reported on 06/26/2017) 30 g 2 Not Taking  . pantoprazole (PROTONIX) 20 MG tablet Take 1 tablet (20 mg total) by mouth daily. (Patient not taking: Reported on 06/26/2017) 30 tablet 2 Not Taking  . Prenatal Multivit-Min-Fe-FA (PRENATAL VITAMINS PO) Take 1 tablet by mouth daily.   Taking  . promethazine (PHENERGAN) 25 MG tablet Take 1 tablet (25 mg total) by mouth every 6 (six) hours as needed for nausea or vomiting. (Patient not taking: Reported on 06/26/2017) 12 tablet 2 Not Taking  . terconazole (TERAZOL 7) 0.4 % vaginal cream Place 1 applicator vaginally at bedtime. 45 g 0   . triamcinolone ointment (KENALOG) 0.1 % Apply 1 application topically 2 (two) times daily. (Patient not taking: Reported on 06/26/2017) 80 g 0 Not Taking    I have reviewed patient's Past Medical Hx, Surgical Hx, Family Hx, Social Hx, medications and allergies.   ROS:  Review of Systems  Constitutional: Negative for fever.  Gastrointestinal: Positive for abdominal pain. Negative for constipation, diarrhea, nausea and vomiting.  Genitourinary: Positive for pelvic pain and vaginal bleeding. Negative for dysuria.  Musculoskeletal: Negative for myalgias.  Neurological: Negative for headaches.   Other systems negative  Physical Exam  Patient Vitals for the past 24 hrs:  BP Temp Temp src Pulse Resp SpO2 Height Weight  07/15/17 1142 120/62 98.4 F (36.9 C) Oral 93 17 98 % 5' 0.5" (1.537 m) 201 lb (91.2 kg)   Constitutional: Well-developed, well-nourished female in no acute distress.  Cardiovascular: normal rate and rhythm Respiratory: normal effort, clear to  auscultation bilaterally GI: Abd soft, non-tender, gravid appropriate for gestational age.   No rebound or guarding. MS: Extremities nontender, no edema, normal ROM Neurologic: Alert and oriented x 4.  GU: Neg CVAT.  PELVIC EXAM: Cervix pink, visually closed, without lesion, small dark red/brown mucousy blood in vault, vaginal walls and external genitalia normal Bimanual exam: Cervix soft, posterior, neg CMT, uterus nontender, Fundal Height consistent with dates, adnexa without tenderness, enlargement, or mass   .Dilation: Fingertip Effacement (%): 50 Cervical Position: Middle Station: Ballotable Presentation: Undeterminable Exam by:: Aijalon Demuro CNM  FHT:  Baseline 140 , moderate variability, accelerations present, no decelerations Contractions: q 4-5 mins Irregular   Toco initially did not show but one or two contractions, adjusted and then I was able to see contractions.   Labs: Results for orders placed or performed during the hospital encounter of 07/15/17 (from the past 24 hour(s))  Urinalysis, Routine w reflex microscopic     Status: Abnormal   Collection  Time: 07/15/17 11:25 AM  Result Value Ref Range   Color, Urine YELLOW YELLOW   APPearance CLOUDY (A) CLEAR   Specific Gravity, Urine 1.011 1.005 - 1.030   pH 7.0 5.0 - 8.0   Glucose, UA NEGATIVE NEGATIVE mg/dL   Hgb urine dipstick LARGE (A) NEGATIVE   Bilirubin Urine NEGATIVE NEGATIVE   Ketones, ur NEGATIVE NEGATIVE mg/dL   Protein, ur 30 (A) NEGATIVE mg/dL   Nitrite NEGATIVE NEGATIVE   Leukocytes, UA LARGE (A) NEGATIVE   RBC / HPF 6-30 0 - 5 RBC/hpf   WBC, UA TOO NUMEROUS TO COUNT 0 - 5 WBC/hpf   Bacteria, UA MANY (A) NONE SEEN   Squamous Epithelial / LPF 6-30 (A) NONE SEEN   Mucous PRESENT    FFn negative last week  O/Positive/-- (05/07 0944)  Imaging:  [place] [facility] Ob Follow Up  Result Date: 06/26/2017 ----------------------------------------------------------------------  OBSTETRICS REPORT                       (Signed Final 06/26/2017 09:45 am) ---------------------------------------------------------------------- Patient Info  ID #:       [id number]                         D.O.B.:   [birth year] (30 yrs)  Name:       Jennifer Holmes            Visit Date:  06/26/2017 08:52 am ---------------------------------------------------------------------- Performed By  Performed By:     Tommi Emery         Ref. Address:     [address]                                                             [place], [place]                                                             [address]  Attending:        Particia Nearing MD       Location:         [facility]  Referred By:      Lesly Dukes MD ---------------------------------------------------------------------- Orders   #  Description                                 Code   1  [place] [facility] OB FOLLOW UP  16109.60  ----------------------------------------------------------------------   #  Ordered By               Order #        Accession #    Episode #   1  MARK Ezzard Standing              [id number]      [id number]     [id number]  ---------------------------------------------------------------------- Indications   [redacted] weeks gestation of pregnancy                Z3A.27   Poor obstetric history: Previous preterm       O09.219   delivery, antepartum ([redacted]w[redacted]d)   History of cesarean delivery, currently        O34.219   pregnant   Obesity complicating pregnancy, second         O99.212   trimester   Hypertension - Chronic/Pre-existing-labetalol  O10.019   Poor obstetric history: Previous               O09.299   preeclampsia / eclampsia/gestational   HTN(HELLP syndrome)  ---------------------------------------------------------------------- OB History  Blood Type:            Height:  5'0"   Weight (lb):  193      BMI:   37.69  Gravidity:    3         Term:   1        Prem:   1   Living:       2 ---------------------------------------------------------------------- Fetal Evaluation  Num Of Fetuses:     1  Fetal Heart         137  Rate(bpm):  Cardiac Activity:   Observed  Presentation:       Cephalic  Placenta:           Anterior, above cervical os  P. Cord Insertion:  Visualized  Amniotic Fluid  AFI FV:      Subjectively within normal limits                              Largest Pocket(cm)                              6.77 ---------------------------------------------------------------------- Biometry  BPD:      71.1  mm     G. Age:  28w 4d         71  %    CI:        80.03   %   70 - 86                                                          FL/HC:      20.4   %   18.8 - 20.6  HC:      251.1  mm     G. Age:  27w 2d         15  %    HC/AC:      1.01       1.05 - 1.21  AC:      249.4  mm     G. Age:  29w 1d  85  %    FL/BPD:     72.2   %   71 - 87  FL:       51.3  mm     G. Age:  27w 3d         32  %    FL/AC:      20.6   %   20 - 24  Est. FW:    1215  gm    2 lb 11 oz      68  % ---------------------------------------------------------------------- Gestational Age  LMP:           32w 5d       Date:   11/09/16                 EDD:   08/16/17  U/S Today:     28w 1d                                        EDD:   09/17/17  Best:          27w 4d    Det. ByMarcella Dubs         EDD:   09/21/17                                      (03/19/17) ---------------------------------------------------------------------- Anatomy  Cranium:               Appears normal         Aortic Arch:            Previously seen  Cavum:                 Appears normal         Ductal Arch:            Previously seen  Ventricles:            Appears normal         Diaphragm:              Previously seen  Choroid Plexus:        Previously seen        Stomach:                Appears normal, left                                                                        sided  Cerebellum:            Previously seen         Abdomen:                Appears normal  Posterior Fossa:       Previously seen        Abdominal Wall:         Previously seen  Nuchal Fold:           Not applicable (>20    Cord Vessels:  Appears normal ([redacted]                         wks GA)                                        vessel cord)  Face:                  Orbits and profile     Kidneys:                Appear normal                         previously seen  Lips:                  Previously seen        Bladder:                Appears normal  Thoracic:              Appears normal         Spine:                  Appears normal  Heart:                 Appears normal         Upper Extremities:      Previously seen                         (4CH, axis, and situs  RVOT:                  Previously seen        Lower Extremities:      Previously seen  LVOT:                  Previously seen  Other:  Fetus appears to be a female. Heels and 5th digit prev. visualized.          Technically difficult due to maternal habitus and fetal position. ---------------------------------------------------------------------- Cervix Uterus Adnexa  Cervix  Length:            4.2  cm.  Normal appearance by transabdominal scan.  Uterus  No abnormality visualized.  Left Ovary  Size(cm)       4.9 x    1.7    x  2.2       Vol(ml): 9.6  Within normal limits.  Right Ovary  Size(cm)       3.5 x     2     x  1.5       Vol(ml): 5.5  Within normal limits. ---------------------------------------------------------------------- Impression  SIUP at 27+4 weeks  Normal interval anatomy; anatomic survey complete  Normal amniotic fluid volume  Appropriate interval growth with EFW at the 68th %tile ---------------------------------------------------------------------- Recommendations  Follow-up ultrasound for growth in 4 weeks ----------------------------------------------------------------------                 Particia Nearing, MD Electronically Signed Final Report   06/26/2017 09:45 am  ----------------------------------------------------------------------   MAU Course/MDM: I have ordered labs and reviewed results. UA suspicious for infection. Will send to culture, ?contaminated NST reviewed and found to be reassuring. Consult Dr Erin Fulling with presentation, exam findings and test results.  Treatments in MAU included Procardia which slowed but did not stop contractions. Was going to give her another but she developed nausea/vomiting so IV started and Zofran given with fluids This helped contractions to slow down to every 13-15 min.  Cervix rechecked and found to be unchanged Less bloody show.  Will discharge home with Repeat Betamethasone tomorrow. Will treat for presumptive UTI due to urinary frequency noted today and large leukocytes  Urine to culture .    Assessment: 1. Supervision of high risk pregnancy, antepartum   History of preterm delivery, currently pregnant in second trimester - Plan: Discharge patient  Preterm uterine contractions in third trimester, antepartum - Plan: Discharge patient  Antepartum bleeding, third trimester - Plan: Discharge patient  Chronic hypertension in pregnancy - Plan: Discharge patient  History of fatty infiltration of liver - Plan: Discharge patient  Leukocytes in urine  SIUP at [redacted]w[redacted]d   Plan: Discharge home Preterm Labor precautions and fetal kick counts Push fluids at home Return to office tomorrow for Betamethasone Follow up in Office for prenatal visits and recheck of status  Encouraged to return here or to other Urgent Care/ED if she develops worsening of symptoms, increase in pain, fever, or other concerning symptoms.   Pt stable at time of discharge.  Wynelle Bourgeois CNM, MSN Certified Nurse-Midwife 07/15/2017 12:07 PM

## 2017-07-15 NOTE — Discharge Instructions (Signed)
Informacin sobre el parto prematuro  (Preterm Labor Information)  Se llama parto prematuro cuando se inicia antes de las 37 semanas de embarazo. La duracin de un embarazo normal es de 39 a 41 semanas.  CAUSAS  Generalmente las causas del parto prematuro no se conocen. La causa ms frecuente conocida es una infeccin.  FACTORES DE RIESGO   Historia previa de parto prematuro.  Romper la bolsa de aguas antes de tiempo.  La placenta cubre la abertura del cuello.  La placenta se despega del tero.  El cuello es demasiado dbil para contener al beb en el tero.  Hay mucho lquido en el saco amnitico.  Consumo de drogas o hbito de fumar durante el embarazo.  No aumentar de peso lo suficiente durante el embarazo.  Mujeres menores de 18 aos o mayores de 35 aos.  Tener bajos ingresos.  Pertenecer a la raza afroamericana. SNTOMAS   Clicos similares a los menstruales, dolor en el vientre (abdominal) o dolor en la espalda.  Contracciones regulares, tan frecuentes como seis en una hora. Pueden ser suaves o dolorosas.  Contracciones que comienzan en la parte superior del vientre. Luego bajan hacia la zona inferior del vientre y hacia la espalda.  Presin en la zona inferior del vientre que parece empeorar.  Sangrado que proviene de la vagina.  Prdida de lquido por la vagina. TRATAMIENTO  El tratamiento depende de:   Su estado.  El estado del beb.  Cuntas semanas tiene de embarazo. El mdico podr indicarle:   Medicamentos para detener las contracciones.  Que permanezca en la cama excepto para ir al bao (reposo en cama).  Que permanezca en el hospital. QU DEBE HACER SI PIENSA QUE EST EN TRABAJO DE PARTO PREMATURO?  Comunquese con su mdico de inmediato. Debe concurrir al hospital para ser controlada inmediatamente.  CMO PUEDE EVITAR EL TRABAJO DE PARTO PREMATURO EN FUTUROS EMBARAZOS?   Si fuma, abandone el hbito.  Mantenga un aumento de peso  saludable.  Notome drogas ni manipule sustancias qumicas que no necesita.  Informe a su mdico si piensa que tiene una infeccin.  Informe a su mdico si tuvo un trabajo de parto prematuro anteriormente. Esta informacin no tiene como fin reemplazar el consejo del mdico. Asegrese de hacerle al mdico cualquier pregunta que tenga. Document Released: 12/29/2010 Document Revised: 07/29/2013 Elsevier Interactive Patient Education  2017 [employer]  

## 2017-07-16 LAB — CULTURE, OB URINE

## 2017-07-17 ENCOUNTER — Inpatient Hospital Stay (HOSPITAL_COMMUNITY): Payer: Medicaid Other

## 2017-07-17 ENCOUNTER — Encounter (HOSPITAL_COMMUNITY): Payer: Self-pay

## 2017-07-17 ENCOUNTER — Inpatient Hospital Stay (HOSPITAL_COMMUNITY)
Admission: AD | Admit: 2017-07-17 | Discharge: 2017-07-21 | DRG: 774 | Disposition: A | Discharge status: Home / Self Care | Payer: Medicaid Other | Source: Ambulatory Visit | Attending: Family Medicine | Admitting: Family Medicine

## 2017-07-17 DIAGNOSIS — K219 Gastro-esophageal reflux disease without esophagitis: Secondary | ICD-10-CM | POA: Diagnosis present

## 2017-07-17 DIAGNOSIS — O1002 Pre-existing essential hypertension complicating childbirth: Secondary | ICD-10-CM | POA: Diagnosis present

## 2017-07-17 DIAGNOSIS — O34211 Maternal care for low transverse scar from previous cesarean delivery: Secondary | ICD-10-CM | POA: Diagnosis present

## 2017-07-17 DIAGNOSIS — Z7982 Long term (current) use of aspirin: Secondary | ICD-10-CM | POA: Diagnosis not present

## 2017-07-17 DIAGNOSIS — O9962 Diseases of the digestive system complicating childbirth: Secondary | ICD-10-CM | POA: Diagnosis present

## 2017-07-17 DIAGNOSIS — O4693 Antepartum hemorrhage, unspecified, third trimester: Secondary | ICD-10-CM | POA: Diagnosis present

## 2017-07-17 DIAGNOSIS — Z3A30 30 weeks gestation of pregnancy: Secondary | ICD-10-CM

## 2017-07-17 HISTORY — DX: Personal history of other medical treatment: Z92.89

## 2017-07-17 HISTORY — DX: HELLP syndrome (HELLP), unspecified trimester: O14.20

## 2017-07-17 LAB — URINALYSIS, ROUTINE W REFLEX MICROSCOPIC
Bilirubin Urine: NEGATIVE
Glucose, UA: NEGATIVE mg/dL
Ketones, ur: NEGATIVE mg/dL
Nitrite: NEGATIVE
Protein, ur: 30 mg/dL — AB
Specific Gravity, Urine: 1.011 (ref 1.005–1.030)
Trans Epithel, UA: <1
pH: 7 (ref 5.0–8.0)

## 2017-07-17 LAB — CBC
HCT: 35.2 % — ABNORMAL LOW (ref 36.0–46.0)
Hemoglobin: 12.2 g/dL (ref 12.0–15.0)
MCH: 31 pg (ref 26.0–34.0)
MCHC: 34.7 g/dL (ref 30.0–36.0)
MCV: 89.6 fL (ref 78.0–100.0)
Platelets: 333 10*3/uL (ref 150–400)
RBC: 3.93 MIL/uL (ref 3.87–5.11)
RDW: 13.5 % (ref 11.5–15.5)
WBC: 8.5 10*3/uL (ref 4.0–10.5)

## 2017-07-17 LAB — TYPE AND SCREEN
ABO/RH(D): O POS
Antibody Screen: NEGATIVE

## 2017-07-17 LAB — GC/CHLAMYDIA PROBE AMP (~~LOC~~) NOT AT ARMC
Chlamydia: NEGATIVE
Neisseria Gonorrhea: NEGATIVE

## 2017-07-17 LAB — OB RESULTS CONSOLE GC/CHLAMYDIA: Gonorrhea: NEGATIVE

## 2017-07-17 LAB — RPR: RPR Ser Ql: NONREACTIVE

## 2017-07-17 MED ORDER — DOCUSATE SODIUM 100 MG PO CAPS
100.0000 mg | ORAL_CAPSULE | Freq: Every day | ORAL | Status: DC
  Administered 2017-07-18: 100 mg via ORAL
  Filled 2017-07-17 (×2): qty 1

## 2017-07-17 MED ORDER — PRENATAL MULTIVITAMIN CH
1.0000 | ORAL_TABLET | Freq: Every day | ORAL | Status: DC
  Administered 2017-07-17 – 2017-07-18 (×2): 1 via ORAL
  Filled 2017-07-17 (×3): qty 1

## 2017-07-17 MED ORDER — ASPIRIN 81 MG PO CHEW
81.0000 mg | CHEWABLE_TABLET | Freq: Every day | ORAL | Status: DC
  Administered 2017-07-17 – 2017-07-18 (×2): 81 mg via ORAL
  Filled 2017-07-17 (×3): qty 1

## 2017-07-17 MED ORDER — LABETALOL HCL 100 MG PO TABS
100.0000 mg | ORAL_TABLET | Freq: Two times a day (BID) | ORAL | Status: DC
  Administered 2017-07-17: 100 mg via ORAL
  Filled 2017-07-17: qty 0.5
  Filled 2017-07-17: qty 1
  Filled 2017-07-17: qty 0.5

## 2017-07-17 MED ORDER — ACETAMINOPHEN 325 MG PO TABS
650.0000 mg | ORAL_TABLET | ORAL | Status: DC | PRN
  Administered 2017-07-18 – 2017-07-19 (×2): 650 mg via ORAL
  Filled 2017-07-17 (×2): qty 2

## 2017-07-17 MED ORDER — SODIUM CHLORIDE 0.9 % IV SOLN: INTRAVENOUS | Status: DC

## 2017-07-17 MED ORDER — ZOLPIDEM TARTRATE 5 MG PO TABS
5.0000 mg | ORAL_TABLET | Freq: Every evening | ORAL | Status: DC | PRN
Start: 2017-07-17 — End: 2017-07-19
  Filled 2017-07-17: qty 1

## 2017-07-17 MED ORDER — BETAMETHASONE SOD PHOS & ACET 6 (3-3) MG/ML IJ SUSP
12.0000 mg | Freq: Once | INTRAMUSCULAR | Status: AC
  Administered 2017-07-17: 12 mg via INTRAMUSCULAR
  Filled 2017-07-17: qty 2

## 2017-07-17 MED ORDER — LACTATED RINGERS IV SOLN
INTRAVENOUS | Status: DC
  Administered 2017-07-17 – 2017-07-18 (×4): via INTRAVENOUS
  Administered 2017-07-18: 125 mL/h via INTRAVENOUS
  Administered 2017-07-18: 06:00:00 via INTRAVENOUS
  Administered 2017-07-19: 125 mL/h via INTRAVENOUS

## 2017-07-17 MED ORDER — LACTATED RINGERS IV SOLN
Freq: Once | INTRAVENOUS | Status: AC
  Administered 2017-07-17: 03:00:00 via INTRAVENOUS

## 2017-07-17 MED ORDER — LACTATED RINGERS IV BOLUS (SEPSIS)
500.0000 mL | Freq: Once | INTRAVENOUS | Status: AC
  Administered 2017-07-17: 500 mL via INTRAVENOUS

## 2017-07-17 MED ORDER — CALCIUM CARBONATE ANTACID 500 MG PO CHEW
2.0000 | CHEWABLE_TABLET | ORAL | Status: DC | PRN
  Administered 2017-07-18: 400 mg via ORAL
  Filled 2017-07-17: qty 2

## 2017-07-17 MED ORDER — NIFEDIPINE 10 MG PO CAPS
10.0000 mg | ORAL_CAPSULE | ORAL | Status: AC
  Administered 2017-07-17 (×2): 10 mg via ORAL
  Filled 2017-07-17 (×2): qty 1

## 2017-07-17 MED ORDER — NIFEDIPINE 10 MG PO CAPS
10.0000 mg | ORAL_CAPSULE | ORAL | Status: DC | PRN
  Administered 2017-07-17: 10 mg via ORAL
  Filled 2017-07-17: qty 1

## 2017-07-17 MED ORDER — NIFEDIPINE 10 MG PO CAPS
10.0000 mg | ORAL_CAPSULE | ORAL | Status: DC | PRN
Start: 2017-07-17 — End: 2017-07-17
  Administered 2017-07-17: 10 mg via ORAL

## 2017-07-17 NOTE — MAU Note (Signed)
Pt reports contractions every and vaginal bleeding. Pt states bleeding is like a period. States she changes her pad every 10 mins.for the past 2 hours. Pt reports good fetal movement.

## 2017-07-17 NOTE — H&P (Signed)
ANTEPARTUM ADMISSION HISTORY AND PHYSICAL NOTE   History of Present Illness: Jennifer Holmes is a 31 y.o. G3P1102 at [redacted]w[redacted]d admitted for vaginal bleeding and contractions. Patient was seen in the MAU on 07-15-2017 and had some bloody show. She received one dose of procardia, BMZ x 1, and  she was discharged with antibiotics for possible UTI. Marland Kitchen   Patient reports the fetal movement as active. Patient reports uterine contraction  activity as irregular, every 2-3 minutes. Patient reports  vaginal bleeding as flow about like a period. Patient describes fluid per vagina as none. Fetal presentation is cephalic.  Patient Active Problem List   Diagnosis Date Noted  . History of preterm delivery, currently pregnant in second trimester 05/29/2017  . Supervision of high risk pregnancy, antepartum 04/15/2017  . History of vaginal delivery following previous cesarean delivery 04/15/2017  . History of cesarean delivery, currently pregnant 04/15/2017  . History of HELLP syndrome, currently pregnant 01/27/2015  . Chronic hypertension in pregnancy   . History of fatty infiltration of liver 09/12/2014    Past Medical History:  Diagnosis Date  . Blood transfusion without reported diagnosis   . Fatty liver    per patient with last pregancy  . GERD (gastroesophageal reflux disease)   . Headache   . Hypertension   . PONV (postoperative nausea and vomiting)   . Preterm labor     Past Surgical History:  Procedure Laterality Date  . CESAREAN SECTION      OB History  Gravida Para Term Preterm AB Living  3 2 1 1  0 2  SAB TAB Ectopic Multiple Live Births  0 0 0 0 2    # Outcome Date GA Lbr Len/2nd Weight Sex Delivery Anes PTL Lv  3 Current           2 Preterm 01/24/15 [redacted]w[redacted]d  6 lb 15.6 oz (3.165 kg) F VBAC Local  LIV     Birth Comments: Born at 34 weeks, maternal HTN, vaginal delivery, jaundice at birth - phototherapy, home after 3 days.  1 Term 03/01/08 [redacted]w[redacted]d  8 lb (3.629 kg) M CS-Unspec  Spinal  LIV     Birth Comments: c/s due to mother's heart problems; pp hemorrhage     Obstetric Comments  Had a blood transfusion with first pregnancy after her C/S in [place].    Social History   Social History  . Marital status: Married    Spouse name: N/A  . Number of children: N/A  . Years of education: N/A   Social History Main Topics  . Smoking status: Never Smoker  . Smokeless tobacco: Never Used  . Alcohol use No  . Drug use: No  . Sexual activity: Yes    Birth control/ protection: None   Other Topics Concern  . None   Social History Narrative  . None    Family History  Problem Relation Age of Onset  . Hypertension Mother   . Heart disease Mother   . Hypertension Brother     Allergies  Allergen Reactions  . Lactose Intolerance (Gi) Other (See Comments)    Reaction:  GI upset    Prescriptions Prior to Admission  Medication Sig Dispense Refill Last Dose  . aspirin EC 81 MG tablet Take 1 tablet (81 mg total) by mouth daily. 90 tablet 3 07/14/2017 at Unknown time  . cephALEXin (KEFLEX) 500 MG capsule Take 1 capsule (500 mg total) by mouth 4 (four) times daily. 28 capsule 0   .  Cetirizine HCl 10 MG CAPS Take 1 capsule (10 mg total) by mouth daily. (Patient not taking: Reported on 05/28/2017) 30 capsule 3 Not Taking  . labetalol (NORMODYNE) 200 MG tablet Take 0.5 tablets (100 mg total) by mouth 2 (two) times daily. 60 tablet 3 07/14/2017 at Unknown time  . metoCLOPramide (REGLAN) 10 MG tablet Take 1 tablet (10 mg total) by mouth 4 (four) times daily. (Patient not taking: Reported on 07/08/2017) 60 tablet 1 Not Taking  . metroNIDAZOLE (FLAGYL) 500 MG tablet Take 1 tablet (500 mg total) by mouth 2 (two) times daily. 14 tablet 0 07/14/2017 at Unknown time  . miconazole (MONISTAT 7) 2 % vaginal cream Place 1 Applicatorful vaginally at bedtime. Apply for seven nights (Patient not taking: Reported on 06/26/2017) 30 g 2 Not Taking  . pantoprazole (PROTONIX) 20 MG tablet Take 1  tablet (20 mg total) by mouth daily. (Patient not taking: Reported on 06/26/2017) 30 tablet 2 Not Taking  . Prenatal Multivit-Min-Fe-FA (PRENATAL VITAMINS PO) Take 1 tablet by mouth daily.   07/14/2017 at Unknown time  . promethazine (PHENERGAN) 25 MG tablet Take 1 tablet (25 mg total) by mouth every 6 (six) hours as needed for nausea or vomiting. (Patient not taking: Reported on 06/26/2017) 12 tablet 2 Not Taking  . terconazole (TERAZOL 7) 0.4 % vaginal cream Place 1 applicator vaginally at bedtime. 45 g 0 07/14/2017 at Unknown time  . triamcinolone ointment (KENALOG) 0.1 % Apply 1 application topically 2 (two) times daily. (Patient not taking: Reported on 06/26/2017) 80 g 0 Not Taking    Review of Systems - Negative except abdominal pain and vaginal bleeding.   Vitals:  BP 132/73 (BP Location: Left Arm)   Pulse 91   Temp 97.9 F (36.6 C) (Oral)   Resp 18   Wt 204 lb 4 oz (92.6 kg)   LMP 11/09/2016 (LMP Unknown)   SpO2 99%   BMI 39.23 kg/m  Physical Examination: CONSTITUTIONAL: Well-developed, well-nourished female in no acute distress.  HENT:  Normocephalic, atraumatic, External right and left ear normal. Oropharynx is clear and moist EYES: Conjunctivae and EOM are normal. Pupils are equal, round, and reactive to light. No scleral icterus.  NECK: Normal range of motion, supple, no masses SKIN: Skin is warm and dry. No rash noted. Not diaphoretic. No erythema. No pallor. NEUROLGIC: Alert and oriented to person, place, and time. Normal reflexes, muscle tone coordination. No cranial nerve deficit noted. PSYCHIATRIC: Normal mood and affect. Normal behavior. Normal judgment and thought content. CARDIOVASCULAR: Normal heart rate noted, regular rhythm RESPIRATORY: Effort and breath sounds normal, no problems with respiration noted ABDOMEN: Soft, nontender, nondistended, gravid. MUSCULOSKELETAL: Normal range of motion. No edema and no tenderness. 2+ distal pulses.  Cervix: Evaluated by digital  exam. and found to be 0.5 cm/ 50%/Floating and fetal presentation is unsure. Membranes:intact Fetal Monitoring:Baseline: 150 bpm, present acel, no decel, mod variable Tocometer: irregular contractions   Labs:  No results found for this or any previous visit (from the past 24 hour(s)).  Imaging Studies: [place] Mfm Ob Follow Up  Result Date: 06/26/2017 ----------------------------------------------------------------------  OBSTETRICS REPORT                      (Signed Final 06/26/2017 09:45 am) ---------------------------------------------------------------------- Patient Info  ID #:       [id number]                         D.O.B.:  18-Feb-1986 (30 yrs)  Name:       Francoise SchaumannSUCENA GUADA ZOXWRTIZ            Visit Date:  06/26/2017 08:52 am ---------------------------------------------------------------------- Performed By  Performed By:     Tommi Emerylivia Johnson         Ref. Address:     [address]                    [address]                                                             [address]                                                             [place], [place]                                                             [phone]  Attending:        Particia NearingMartha Decker MD       [facility]  Referred By:      Lesly DukesKELLY H                    LEGGETT MD ---------------------------------------------------------------------- Orders   #  Description                                 Code   1  US MFM OB FOLLOW UP                         [id number]  ----------------------------------------------------------------------   #  Ordered By               Order #        Accession #    Episode #   1  MARK Ezzard StandingNEWMAN              [id number]      [id number]     [id number]  ---------------------------------------------------------------------- Indications   [redacted] weeks gestation of pregnancy                Z3A.27   Poor obstetric history: Previous preterm       O09.219   delivery, antepartum (1756w1d)   History of cesarean delivery, currently         O34.219   pregnant   Obesity complicating pregnancy, second         O99.212   trimester   Hypertension - Chronic/Pre-existing-labetalol  O10.019   Poor obstetric history: Previous               O09.299   preeclampsia / eclampsia/gestational   HTN(HELLP syndrome)  ---------------------------------------------------------------------- OB History  Blood Type:  Height:  5'0"   Weight (lb):  193      BMI:   37.69  Gravidity:    3         Term:   1        Prem:   1  Living:       2 ---------------------------------------------------------------------- Fetal Evaluation  Num Of Fetuses:     1  Fetal Heart         137  Rate(bpm):  Cardiac Activity:   Observed  Presentation:       Cephalic  Placenta:           Anterior, above cervical os  P. Cord Insertion:  Visualized  Amniotic Fluid  AFI FV:      Subjectively within normal limits                              Largest Pocket(cm)                              6.77 ---------------------------------------------------------------------- Biometry  BPD:      71.1  mm     G. Age:  28w 4d         71  %    CI:        80.03   %   70 - 86                                                          FL/HC:      20.4   %   18.8 - 20.6  HC:      251.1  mm     G. Age:  27w 2d         15  %    HC/AC:      1.01       1.05 - 1.21  AC:      249.4  mm     G. Age:  29w 1d         85  %    FL/BPD:     72.2   %   71 - 87  FL:       51.3  mm     G. Age:  27w 3d         32  %    FL/AC:      20.6   %   20 - 24  Est. FW:    1215  gm    2 lb 11 oz      68  % ---------------------------------------------------------------------- Gestational Age  LMP:           32w 5d       Date:   11/09/16                 EDD:   08/16/17  U/S Today:     28w 1d                                        EDD:   09/17/17  Best:          Thomas Hoff 4d  Det. By:   Marcella Dubs         EDD:   09/21/17                                      (03/19/17) ---------------------------------------------------------------------- Anatomy   Cranium:               Appears normal         Aortic Arch:            Previously seen  Cavum:                 Appears normal         Ductal Arch:            Previously seen  Ventricles:            Appears normal         Diaphragm:              Previously seen  Choroid Plexus:        Previously seen        Stomach:                Appears normal, left                                                                        sided  Cerebellum:            Previously seen        Abdomen:                Appears normal  Posterior Fossa:       Previously seen        Abdominal Wall:         Previously seen  Nuchal Fold:           Not applicable (>20    Cord Vessels:           Appears normal ([redacted]                         wks GA)                                        vessel cord)  Face:                  Orbits and profile     Kidneys:                Appear normal                         previously seen  Lips:                  Previously seen        Bladder:                Appears normal  Thoracic:              Appears normal  Spine:                  Appears normal  Heart:                 Appears normal         Upper Extremities:      Previously seen                         (4CH, axis, and situs  RVOT:                  Previously seen        Lower Extremities:      Previously seen  LVOT:                  Previously seen  Other:  Fetus appears to be a female. Heels and 5th digit prev. visualized.          Technically difficult due to maternal habitus and fetal position. ---------------------------------------------------------------------- Cervix Uterus Adnexa  Cervix  Length:            4.2  cm.  Normal appearance by transabdominal scan.  Uterus  No abnormality visualized.  Left Ovary  Size(cm)       4.9 x    1.7    x  2.2       Vol(ml): 9.6  Within normal limits.  Right Ovary  Size(cm)       3.5 x     2     x  1.5       Vol(ml): 5.5  Within normal limits. ----------------------------------------------------------------------  Impression  SIUP at 27+4 weeks  Normal interval anatomy; anatomic survey complete  Normal amniotic fluid volume  Appropriate interval growth with EFW at the 68th %tile ---------------------------------------------------------------------- Recommendations  Follow-up ultrasound for growth in 4 weeks ----------------------------------------------------------------------                 Particia Nearing, MD Electronically Signed Final Report   06/26/2017 09:45 am ----------------------------------------------------------------------    Assessment and Plan: Patient Active Problem List   Diagnosis Date Noted  . History of preterm delivery, currently pregnant in second trimester 05/29/2017  . Supervision of high risk pregnancy, antepartum 04/15/2017  . History of vaginal delivery following previous cesarean delivery 04/15/2017  . History of cesarean delivery, currently pregnant 04/15/2017  . History of HELLP syndrome, currently pregnant 01/27/2015  . Chronic hypertension in pregnancy   . History of fatty infiltration of liver 09/12/2014   Admit to Antenatal Routine antenatal care -Second dose of BMZ received in MAU -MFM limited US shows no signs of abruption or placenta previa -continue ASA daily and labetalol 100 mg BID -CMP, GBS and GC CT pending   Marylene Land, CNM

## 2017-07-17 NOTE — Progress Notes (Deleted)
  Jennifer Ezekiel SlocumbGuadalupe Holmes is a 31 y.o. G3P1102 at 4275w4d presented for vaginal bleeding and contractions.  S: She now has no contractions and only has minor bleeding on wiping after using the bathroom. She denies any pain at this time.   O:  BP (!) 126/58   Pulse 82   Temp 98.1 F (36.7 C) (Oral)   Resp 18   Ht 5' 0.5" (1.537 m)   Wt 92.5 kg (204 lb)   LMP 11/09/2016 (LMP Unknown)   SpO2 98%   BMI 39.19 kg/m  EFM: 140/mod variability/accels present  CVE: Dilation: Fingertip (internal os closed, external os 1.5) Exam by:: Kooistra CNM   A&P: 31 y.o. [id number] 7975w4d admitted for concerns of vaginal bleeding and contractions.  She has since stopped having contractions and does not have active bleeding.  Will continue to monitor for contractions and bleeding.    Ignacia MarvelKendrick C Frimet Durfee, MD 11:48 PM

## 2017-07-17 NOTE — Anesthesia Pain Management Evaluation Note (Signed)
  CRNA Pain Management Visit Note  Patient: Jennifer Holmes, 31 y.o., female  "Hello I am a member of the anesthesia team at [facility]. We have an anesthesia team available at all times to provide care throughout the hospital, including epidural management and anesthesia for C-section. I don't know your plan for the delivery whether it a natural birth, water birth, IV sedation, nitrous supplementation, doula or epidural, but we want to meet your pain goals."   1.Was your pain managed to your expectations on prior hospitalizations?   Yes   2.What is your expectation for pain management during this hospitalization?     Epidural  3.How can we help you reach that goal? Epidural when the order is placed.  Record the patient's initial score and the patient's pain goal.   Pain: 1  Pain Goal: 7 [facility] wants you to be able to say your pain was always managed very well.  [name] 07/17/2017

## 2017-07-18 MED ORDER — LABETALOL HCL 100 MG PO TABS
100.0000 mg | ORAL_TABLET | Freq: Two times a day (BID) | ORAL | Status: DC
  Administered 2017-07-19: 100 mg via ORAL
  Filled 2017-07-18: qty 0.5

## 2017-07-19 ENCOUNTER — Encounter (HOSPITAL_COMMUNITY): Payer: Self-pay

## 2017-07-19 DIAGNOSIS — Z3A30 30 weeks gestation of pregnancy: Secondary | ICD-10-CM

## 2017-07-19 DIAGNOSIS — O1092 Unspecified pre-existing hypertension complicating childbirth: Secondary | ICD-10-CM

## 2017-07-19 LAB — CULTURE, BETA STREP (GROUP B ONLY)

## 2017-07-19 LAB — CBC
HCT: 35.1 % — ABNORMAL LOW (ref 36.0–46.0)
Hemoglobin: 12.2 g/dL (ref 12.0–15.0)
MCH: 31.3 pg (ref 26.0–34.0)
MCHC: 34.8 g/dL (ref 30.0–36.0)
MCV: 90 fL (ref 78.0–100.0)
Platelets: 312 10*3/uL (ref 150–400)
RBC: 3.9 MIL/uL (ref 3.87–5.11)
RDW: 13.9 % (ref 11.5–15.5)
WBC: 9.1 10*3/uL (ref 4.0–10.5)

## 2017-07-19 LAB — RPR: RPR Ser Ql: NONREACTIVE

## 2017-07-19 MED ORDER — SODIUM CHLORIDE 0.9 % IV SOLN: 250.0000 mL | INTRAVENOUS | Status: DC | PRN

## 2017-07-19 MED ORDER — TETANUS-DIPHTH-ACELL PERTUSSIS 5-2.5-18.5 LF-MCG/0.5 IM SUSP: 0.5000 mL | Freq: Once | INTRAMUSCULAR | Status: DC

## 2017-07-19 MED ORDER — DIPHENHYDRAMINE HCL 25 MG PO CAPS
25.0000 mg | ORAL_CAPSULE | Freq: Four times a day (QID) | ORAL | Status: DC | PRN
Start: 2017-07-19 — End: 2017-07-21

## 2017-07-19 MED ORDER — OXYCODONE-ACETAMINOPHEN 5-325 MG PO TABS: 2.0000 | ORAL_TABLET | ORAL | Status: DC | PRN

## 2017-07-19 MED ORDER — ONDANSETRON HCL 4 MG/2ML IJ SOLN: 4.0000 mg | INTRAMUSCULAR | Status: DC | PRN

## 2017-07-19 MED ORDER — OXYTOCIN 40 UNITS IN LACTATED RINGERS INFUSION - SIMPLE MED
INTRAVENOUS | Status: AC
  Administered 2017-07-19: 500 mL via INTRAVENOUS
  Filled 2017-07-19: qty 1000

## 2017-07-19 MED ORDER — OXYCODONE-ACETAMINOPHEN 5-325 MG PO TABS: 1.0000 | ORAL_TABLET | ORAL | Status: DC | PRN

## 2017-07-19 MED ORDER — SIMETHICONE 80 MG PO CHEW: 80.0000 mg | CHEWABLE_TABLET | ORAL | Status: DC | PRN

## 2017-07-19 MED ORDER — SENNOSIDES-DOCUSATE SODIUM 8.6-50 MG PO TABS
2.0000 | ORAL_TABLET | ORAL | Status: DC
  Administered 2017-07-20 – 2017-07-21 (×2): 2 via ORAL
  Filled 2017-07-19 (×2): qty 2

## 2017-07-19 MED ORDER — LIDOCAINE HCL (PF) 1 % IJ SOLN
30.0000 mL | INTRAMUSCULAR | Status: DC | PRN
  Filled 2017-07-19: qty 30

## 2017-07-19 MED ORDER — ACETAMINOPHEN 325 MG PO TABS: 650.0000 mg | ORAL_TABLET | ORAL | Status: DC | PRN

## 2017-07-19 MED ORDER — SODIUM CHLORIDE 0.9 % IV SOLN
2.0000 g | Freq: Once | INTRAVENOUS | Status: AC
  Administered 2017-07-19: 2 g via INTRAVENOUS
  Filled 2017-07-19: qty 2000

## 2017-07-19 MED ORDER — OXYTOCIN 40 UNITS IN LACTATED RINGERS INFUSION - SIMPLE MED: 2.5000 [IU]/h | INTRAVENOUS | Status: DC | PRN

## 2017-07-19 MED ORDER — OXYTOCIN BOLUS FROM INFUSION
500.0000 mL | Freq: Once | INTRAVENOUS | Status: AC
  Administered 2017-07-19: 500 mL via INTRAVENOUS

## 2017-07-19 MED ORDER — DIBUCAINE 1 % RE OINT: 1.0000 "application " | TOPICAL_OINTMENT | RECTAL | Status: DC | PRN

## 2017-07-19 MED ORDER — SOD CITRATE-CITRIC ACID 500-334 MG/5ML PO SOLN: 30.0000 mL | ORAL | Status: DC | PRN

## 2017-07-19 MED ORDER — OXYTOCIN 40 UNITS IN LACTATED RINGERS INFUSION - SIMPLE MED
2.5000 [IU]/h | INTRAVENOUS | Status: DC
  Administered 2017-07-19: 2.5 [IU]/h via INTRAVENOUS

## 2017-07-19 MED ORDER — WITCH HAZEL-GLYCERIN EX PADS: 1.0000 "application " | MEDICATED_PAD | CUTANEOUS | Status: DC | PRN

## 2017-07-19 MED ORDER — ONDANSETRON HCL 4 MG/2ML IJ SOLN: 4.0000 mg | Freq: Four times a day (QID) | INTRAMUSCULAR | Status: DC | PRN

## 2017-07-19 MED ORDER — FLEET ENEMA 7-19 GM/118ML RE ENEM: 1.0000 | ENEMA | RECTAL | Status: DC | PRN

## 2017-07-19 MED ORDER — MISOPROSTOL 200 MCG PO TABS
ORAL_TABLET | ORAL | Status: AC
  Administered 2017-07-19: 800 ug via RECTAL
  Filled 2017-07-19: qty 5

## 2017-07-19 MED ORDER — COCONUT OIL OIL
1.0000 "application " | TOPICAL_OIL | Status: DC | PRN
  Administered 2017-07-19: 1 via TOPICAL
  Filled 2017-07-19: qty 120

## 2017-07-19 MED ORDER — LIDOCAINE HCL (PF) 1 % IJ SOLN
INTRAMUSCULAR | Status: AC
  Filled 2017-07-19: qty 30

## 2017-07-19 MED ORDER — MAGNESIUM HYDROXIDE 400 MG/5ML PO SUSP: 30.0000 mL | ORAL | Status: DC | PRN

## 2017-07-19 MED ORDER — MISOPROSTOL 200 MCG PO TABS
800.0000 ug | ORAL_TABLET | Freq: Once | ORAL | Status: AC
  Administered 2017-07-19: 800 ug via RECTAL

## 2017-07-19 MED ORDER — BENZOCAINE-MENTHOL 20-0.5 % EX AERO
1.0000 "application " | INHALATION_SPRAY | CUTANEOUS | Status: DC | PRN
  Administered 2017-07-19: 1 via TOPICAL
  Filled 2017-07-19: qty 56

## 2017-07-19 MED ORDER — LABETALOL HCL 5 MG/ML IV SOLN: 20.0000 mg | INTRAVENOUS | Status: DC | PRN

## 2017-07-19 MED ORDER — SODIUM CHLORIDE 0.9% FLUSH
3.0000 mL | Freq: Two times a day (BID) | INTRAVENOUS | Status: DC
  Administered 2017-07-19 – 2017-07-20 (×2): 3 mL via INTRAVENOUS

## 2017-07-19 MED ORDER — ONDANSETRON HCL 4 MG PO TABS: 4.0000 mg | ORAL_TABLET | ORAL | Status: DC | PRN

## 2017-07-19 MED ORDER — SODIUM CHLORIDE 0.9% FLUSH: 3.0000 mL | INTRAVENOUS | Status: DC | PRN

## 2017-07-19 MED ORDER — MEASLES, MUMPS & RUBELLA VAC ~~LOC~~ INJ
0.5000 mL | INJECTION | Freq: Once | SUBCUTANEOUS | Status: DC
  Filled 2017-07-19: qty 0.5

## 2017-07-19 MED ORDER — ZOLPIDEM TARTRATE 5 MG PO TABS
5.0000 mg | ORAL_TABLET | Freq: Every evening | ORAL | Status: DC | PRN
Start: 2017-07-19 — End: 2017-07-21
  Filled 2017-07-19: qty 1

## 2017-07-19 MED ORDER — DOCUSATE SODIUM 100 MG PO CAPS
100.0000 mg | ORAL_CAPSULE | Freq: Two times a day (BID) | ORAL | Status: DC
  Administered 2017-07-19 – 2017-07-20 (×3): 100 mg via ORAL
  Filled 2017-07-19 (×4): qty 1

## 2017-07-19 MED ORDER — PRENATAL MULTIVITAMIN CH
1.0000 | ORAL_TABLET | Freq: Every day | ORAL | Status: DC
  Administered 2017-07-19 – 2017-07-21 (×3): 1 via ORAL
  Filled 2017-07-19 (×3): qty 1

## 2017-07-19 MED ORDER — HYDRALAZINE HCL 20 MG/ML IJ SOLN: 10.0000 mg | Freq: Once | INTRAMUSCULAR | Status: DC | PRN

## 2017-07-19 MED ORDER — LACTATED RINGERS IV SOLN: 500.0000 mL | INTRAVENOUS | Status: DC | PRN

## 2017-07-19 MED ORDER — AMLODIPINE BESYLATE 5 MG PO TABS
5.0000 mg | ORAL_TABLET | Freq: Every day | ORAL | Status: DC
  Administered 2017-07-19 – 2017-07-21 (×3): 5 mg via ORAL
  Filled 2017-07-19 (×3): qty 1

## 2017-07-19 MED ORDER — IBUPROFEN 600 MG PO TABS
600.0000 mg | ORAL_TABLET | Freq: Four times a day (QID) | ORAL | Status: DC
  Administered 2017-07-19 – 2017-07-21 (×9): 600 mg via ORAL
  Filled 2017-07-19 (×9): qty 1

## 2017-07-19 MED ORDER — LABETALOL HCL 5 MG/ML IV SOLN
20.0000 mg | Freq: Once | INTRAVENOUS | Status: AC
  Administered 2017-07-19: 20 mg via INTRAVENOUS
  Filled 2017-07-19: qty 4

## 2017-07-19 MED ORDER — CALCIUM CARBONATE ANTACID 500 MG PO CHEW: 1.0000 | CHEWABLE_TABLET | Freq: Every day | ORAL | Status: DC | PRN

## 2017-07-19 NOTE — Progress Notes (Signed)
Jennifer Holmes is a 31 y.o. G3P1102 at 6463w6d with preterm labor and bleeding Subjective:   Objective: BP (!) 168/97 (BP Location: Left Arm)   Pulse 86   Temp 97.7 F (36.5 C) (Oral)   Resp (!) 22   Ht 5' 0.5" (1.537 m)   Wt 92.5 kg (204 lb)   LMP 11/09/2016 (LMP Unknown)   SpO2 99%   BMI 39.19 kg/m  No intake/output data recorded. Total I/O In: 5697.5 [P.O.:1260; I.V.:4437.5] Out: 2300 [Urine:2300]  FHT:  Fetal Heart Rate A  Mode External filed at 07/19/2017 [id number]  Baseline Rate (A) 140 bpm filed at 07/19/2017 0521  Variability <5 BPM, 6-25 BPM filed at 07/19/2017 0521  Accelerations None filed at 07/19/2017 [id number]  Decelerations None filed at 08    UC:   regular, every 3 minutes SVE:   Dilation: 7 Exam by:: Dr. Debroah LoopArnold  Labs: Lab Results  Component Value Date   WBC 8.5 07/17/2017   HGB 12.2 07/17/2017   HCT 35.2 (L) 07/17/2017   MCV 89.6 07/17/2017   PLT 333 07/17/2017    Assessment / Plan: preterm labor  Preeclampsia:  chroic htn, needs labetalol IV Fetal Wellbeing:  Category I Pain Control:  Labor support without medications I/D:  Ampicillin for unknown GBS Anticipated MOD:  NSVD, TOLAC  Jennifer Holmes 07/19/2017, 5:42 AM

## 2017-07-19 NOTE — Consult Note (Signed)
[facility]  [facility] 07/19/2017    7:35 AM  Neonatal Medicine Consultation         Jennifer Ezekiel SlocumbGuadalupe Holmes          MRN:  [id number]  I was called at the request of the patient's obstetrician (Dr. Debroah LoopArnold) to speak to this patient due to impending premature delivery today at 30 6/7 weeks.  The patient's prenatal course includes chronic hypertension, prior VBAC, previous premature birth here at 34 weeks.  She was admitted two days ago with vaginal bleeding and uterine contractions.  She was stable until this morning when she has had increased labor along with cervical change (currently at 7 cm).   She is 30 6/7 weeks currently.  She is admitted to L&D, and is receiving treatment that includes betamethasone (07/17/17 x 1 dose), low dose aspirin, labetalol, procardia (stopped on 8/8 however) .  The baby is a female.  I reviewed expectations for a baby born at 30-31 weeks, including survival, length of stay, morbidities such as respiratory distress, IVH, infection, feeding.  I described how we provide respiratory and feeding support.  Mom plans to breast feed, which I encouraged as best for the baby (with supplementations for needed calories).   I spent 20 minutes reviewing the record, speaking to the patient, and entering appropriate documentation.  More than 50% of the time was spent face to face with patient.   _____________________ Electronically Signed By: Angelita InglesMcCrae S. Foxx Klarich, MD Neonatologist

## 2017-07-19 NOTE — Anesthesia Pain Management Evaluation Note (Signed)
  CRNA Pain Management Visit Note  Patient: Jennifer Holmes, 31 y.o., female  "Hello I am a [facility] of the [facility]. We have an anesthesia team available at all times to provide care throughout the hospital, including epidural management and anesthesia for C-section. I don't know your plan for the delivery whether it a natural birth, water birth, IV sedation, nitrous supplementation, doula or epidural, but we want to meet your pain goals."   1.Was your pain managed to your expectations on prior hospitalizations?   Yes   2.What is your expectation for pain management during this hospitalization?     Labor support without medications  3.How can we help you reach that goal? unsure  Record the patient's initial score and the patient's pain goal.   Pain: 8  Pain Goal: 10 [facility] wants you to be able to say your pain was always managed very well.  Cephus ShellingBURGER,Lillyan Hitson 07/19/2017

## 2017-07-19 NOTE — Lactation Note (Signed)
This note was copied from a baby's chart. Lactation Consultation Note  Patient Name: Jennifer Holmes ZOXWR'UToday's Date: 07/19/2017 Reason for consult: Initial assessment;NICU baby;Preterm <34wks;Infant < 6lbswith this mom of a NICU baby, now just under 6 hours old, and 30 6/[redacted] weeks gestation. I started mom pumping with DEP, in initiation setting. Basic pumping teaching and hand expression reviewed with mom. Mom was not able to express any colostrum after first pumping. I  Reviewed with mom how and when her milk will come to volume. Mom very receptive to teaching, and knows to call for question/concerns. Mom is active with WIC, and a fas referral was sent. Mom aware [facility] if discahrged this W/E.    Maternal Data Formula Feeding for Exclusion: Yes (baby in NICU) Has patient been taught Hand Expression?: Yes Does the patient have breastfeeding experience prior to this delivery?: Yes  Feeding    LATCH Score       Type of Nipple: Everted at rest and after stimulation (mom decreased to 21 flange with good fit)           Interventions Interventions: Hand express;Coconut oil;DEBP  Lactation Tools Discussed/Used Tools: Flanges;Pump Flange Size: 21 Breast pump type: Double-Electric Breast Pump WIC Program: Yes (fax sent for mom to get [place] - [place]) Pump Review: Setup, frequency, and cleaning;Milk Storage;Other (comment) (hand expression reviewed, NICU book on providing EBm for a NICU baby reviewed, as well as lactation services) Initiated by:: Danton Clapchristine Carina Chaplin, RN IBCLC Date initiated:: 07/19/17   Consult Status Consult Status: Follow-up Date: 07/20/17 Follow-up type: In-patient    Alfred LevinsLee, [name] 07/19/2017, 3:25 PM

## 2017-07-19 NOTE — Progress Notes (Signed)
[facility]  Dr Debroah LoopArnold with transfer to room 167.  IV fluids infusing well.

## 2017-07-20 LAB — CBC WITH DIFFERENTIAL/PLATELET
Basophils Absolute: 0 10*3/uL (ref 0.0–0.1)
Basophils Relative: 0 %
Eosinophils Absolute: 0.1 10*3/uL (ref 0.0–0.7)
Eosinophils Relative: 1 %
HCT: 37.6 % (ref 36.0–46.0)
Hemoglobin: 12.8 g/dL (ref 12.0–15.0)
Lymphocytes Relative: 24 %
Lymphs Abs: 2.5 10*3/uL (ref 0.7–4.0)
MCH: 31.1 pg (ref 26.0–34.0)
MCHC: 34 g/dL (ref 30.0–36.0)
MCV: 91.3 fL (ref 78.0–100.0)
Monocytes Absolute: 0.2 10*3/uL (ref 0.1–1.0)
Monocytes Relative: 2 %
Neutro Abs: 7.3 10*3/uL (ref 1.7–7.7)
Neutrophils Relative %: 73 %
Platelets: 340 10*3/uL (ref 150–400)
RBC: 4.12 MIL/uL (ref 3.87–5.11)
RDW: 13.9 % (ref 11.5–15.5)
WBC: 10.2 10*3/uL (ref 4.0–10.5)

## 2017-07-20 NOTE — Progress Notes (Signed)
Dr. Shawnie PonsPratt notified of patient reporting dizziness over the past couple of hours. Order given for a CBC. Carmelina DaneERRI L Deryn Massengale, RN

## 2017-07-20 NOTE — Lactation Note (Signed)
This note was copied from a baby's chart. Lactation Consultation Note  Patient Name: Girl Volney Pressersucena Heninger GMWNU'UToday's Date: 07/20/2017   Visited with P3 Mom, baby 4831 hrs old.  Mom has been regularly pumping on initiation setting.  Encouraged pumping every 2-3 hrs during the day. Unable to collect any colostrum yet.  Encouraged breast massage, and hand expression as well.  Mom able to have baby STS during her feedings.  Encouraged pumping after STS with baby. [facility] given.   No questions presently.  To follow up on day of discharge.    Judee ClaraSmith, Leylah Tarnow E 07/20/2017, 4:25 PM

## 2017-07-20 NOTE — Progress Notes (Signed)
Patient OOB to shower. No report of dizziness. Carmelina DaneERRI L Lilia Letterman, RN

## 2017-07-20 NOTE — Progress Notes (Signed)
Post Partum Day #1  Subjective:  Moniqua Ezekiel SlocumbGuadalupe Paz is a 31 y.o. Z6X0960G3P1203 7318w6d s/p VBAC.  No acute events overnight.  Pt denies problems with ambulating, voiding or po intake.  She denies nausea or vomiting.  Pain is well controlled.  She has had flatus. She has not had bowel movement.  Lochia Small.  Plan for birth control is Nexplanon.  Method of Feeding: Breast  Objective: BP 127/86 (BP Location: Right Arm)   Pulse 73   Temp 98.1 F (36.7 C) (Oral)   Resp 18   Ht 5' 0.5" (1.537 m)   Wt 204 lb (92.5 kg)   LMP 11/09/2016 (LMP Unknown)   SpO2 98%   Breastfeeding? Unknown   BMI 39.19 kg/m   Physical Exam:  General: alert, cooperative and no distress Lochia:normal flow Chest: Normal work of breathing Heart: RRR  Abdomen: +BS, soft, nontender Uterine Fundus: firm DVT Evaluation: No evidence of DVT seen on physical exam. Extremities: trace edema   Recent Labs  07/19/17 0752  HGB 12.2  HCT 35.1*    Assessment/Plan:  ASSESSMENT: Catelynn Ezekiel SlocumbGuadalupe Lawhead is a 31 y.o. A5W0981G3P1203 3118w6d ppd #1 s/p NSVD doing well. Preterm delivery.  Plan for discharge tomorrow, Breastfeeding and Lactation consult  Baby in NICU Continue Norvasc for cHTN   LOS: 3 days   Caryl AdaJazma Phelps, DO 07/20/2017, 9:06 AM

## 2017-07-21 MED ORDER — AMLODIPINE BESYLATE 5 MG PO TABS: 5.0000 mg | ORAL_TABLET | Freq: Every day | ORAL | 1 refills | Status: DC

## 2017-07-21 NOTE — Lactation Note (Signed)
This note was copied from a baby's chart. Lactation Consultation Note  Patient Name: Jennifer Volney [name] WUJWJ'XToday's Date: 07/21/2017   Visited Mom on day of discharge, baby 3951 hrs old.  Mom has been pumping and taking her EBM to baby in the NICU.   WIC loaner pump given.  Encouraged Mom to pump >8 times in 24 hrs.  Talked about benefits of hand expression, breast massage, and STS in the NICU as much as possible.  Mom started to cry as she was packing up.  TLC given. Mom aware of IP and OP lactation support available to her.  Jennifer Holmes, Jennifer Holmes 07/21/2017, 12:29 PM

## 2017-07-21 NOTE — Discharge Instructions (Signed)

## 2017-07-21 NOTE — Progress Notes (Signed)
Pt discharged to home with family.  Condition stable.  Pt home with rented breast pump from our lactation department.  Pt ambulated to car with Luisa DagoJ. Bass, RN.  No equipment for home ordered at discharge.

## 2017-07-21 NOTE — Discharge Summary (Signed)
OB Discharge Summary     Patient Name: Jennifer Holmes DOB: 1986-10-19 MRN: [id number]  Date of admission: 07/17/2017 Delivering MD: Cam Hai D   Date of discharge: 07/21/2017  Admitting diagnosis: 30 WEEKS CTX Intrauterine pregnancy: [redacted]w[redacted]d     Secondary diagnosis:  Active Problems:   Vaginal bleeding in pregnancy, third trimester      Discharge diagnosis: Preterm Pregnancy Delivered, VBAC and CHTN                                                                                                Post partum procedures:None  Augmentation: None  Complications: None  Hospital course:  Onset of Labor With Vaginal Delivery     32 y.o. yo [id number] at [redacted]w[redacted]d was admitted in Active Labor on 07/17/2017. Patient had an uncomplicated labor course as follows:  Membrane Rupture Time/Date: 8:55 AM ,07/19/2017   Intrapartum Procedures: Episiotomy: None [1]                                         Lacerations:  None [1]  Patient had a delivery of a Viable infant. 07/19/2017  Information for the patient's newborn:  Nyella, Eckels [[id number]]  Delivery Method: VBAC, Spontaneous (Filed from Delivery Summary)    Pateint had an uncomplicated postpartum course.  She is ambulating, tolerating a regular diet, passing flatus, and urinating well. Patient is discharged home in stable condition on 07/21/17.   Physical exam  Vitals:   07/19/17 2031 07/20/17 0005 07/20/17 0750 07/20/17 2021  BP: (!) 152/86 (!) 104/56 127/86 119/77  Pulse:  73 73 85  Resp:   18 20  Temp:  98.7 F (37.1 C) 98.1 F (36.7 C) 97.8 F (36.6 C)  TempSrc:  Oral Oral Oral  SpO2:  98% 98% 98%  Weight:      Height:       General: alert, cooperative and no distress Lochia: appropriate Uterine Fundus: firm Incision: N/A DVT Evaluation: No evidence of DVT seen on physical exam. Labs: Lab Results  Component Value Date   WBC 10.2 07/20/2017   HGB 12.8 07/20/2017   HCT 37.6 07/20/2017   MCV 91.3 07/20/2017    PLT 340 07/20/2017   CMP Latest Ref Rng & Units 06/17/2017  Glucose 65 - 99 mg/dL 130(Q)  BUN 6 - 20 mg/dL 7  Creatinine 6.57 - 8.46 mg/dL 9.62(X)  Sodium 528 - 413 mmol/L 137  Potassium 3.5 - 5.2 mmol/L 4.0  Chloride 96 - 106 mmol/L 102  CO2 20 - 29 mmol/L 21  Calcium 8.7 - 10.2 mg/dL 2.4(M)  Total Protein 6.0 - 8.5 g/dL 6.4  Total Bilirubin 0.0 - 1.2 mg/dL 0.3  Alkaline Phos 39 - 117 IU/L 76  AST 0 - 40 IU/L 31  ALT 0 - 32 IU/L 27    Discharge instruction: per After Visit Summary and "Baby and Me Booklet".  After visit meds:  Allergies as of 07/21/2017      Reactions   Lactose Intolerance (  gi) Other (See Comments)   Reaction:  GI upset      Medication List    STOP taking these medications   labetalol 200 MG tablet Commonly known as:  NORMODYNE   miconazole 2 % vaginal cream Commonly known as:  MONISTAT 7     TAKE these medications   calcium carbonate 500 MG chewable tablet Commonly known as:  TUMS - dosed in mg elemental calcium Chew 1 tablet by mouth daily as needed for indigestion or heartburn.       Diet: routine diet  Activity: Advance as tolerated. Pelvic rest for 6 weeks.   Outpatient follow up:6 weeks Follow up Appt: Future Appointments Date Time Provider Department Center  07/22/2017 10:40 AM Lesly DukesLeggett, Kelly H, MD [facility]  07/24/2017 10:30 AM [facility] US 1 [facility]   Follow up Visit:No Follow-up on file.  Postpartum contraception: Nexplanon  Newborn Data: Live born female  Birth Weight: 4 lb 3.7 oz (1920 g) APGAR: 8, 8  Baby Feeding: Bottle Disposition:NICU   07/21/2017 John Giovanniorey P Cox, MD

## 2017-07-22 ENCOUNTER — Encounter: Payer: Self-pay | Admitting: Obstetrics & Gynecology

## 2017-07-24 ENCOUNTER — Ambulatory Visit (HOSPITAL_COMMUNITY): Payer: Self-pay

## 2017-07-24 ENCOUNTER — Encounter (HOSPITAL_COMMUNITY): Payer: Self-pay

## 2017-07-24 ENCOUNTER — Encounter: Payer: Self-pay | Admitting: Obstetrics and Gynecology

## 2017-08-21 ENCOUNTER — Ambulatory Visit: Payer: Self-pay | Admitting: Obstetrics and Gynecology

## 2017-08-21 ENCOUNTER — Encounter: Payer: Self-pay | Admitting: Obstetrics and Gynecology

## 2017-08-21 ENCOUNTER — Ambulatory Visit (HOSPITAL_COMMUNITY)
Admission: EM | Admit: 2017-08-21 | Discharge: 2017-08-21 | Disposition: A | Discharge status: Home / Self Care | Payer: Self-pay | Attending: Family Medicine | Admitting: Family Medicine

## 2017-08-21 ENCOUNTER — Encounter (HOSPITAL_COMMUNITY): Payer: Self-pay | Admitting: Emergency Medicine

## 2017-08-21 DIAGNOSIS — L03012 Cellulitis of left finger: Secondary | ICD-10-CM

## 2017-08-21 MED ORDER — AMOXICILLIN-POT CLAVULANATE 875-125 MG PO TABS: 1.0000 | ORAL_TABLET | Freq: Two times a day (BID) | ORAL | 0 refills | Status: DC

## 2017-08-21 NOTE — ED Triage Notes (Signed)
[facility] a [facility] pain and swelling to the tip of the 3rd finger on her right hand x 3 days.

## 2017-08-21 NOTE — ED Provider Notes (Signed)
[facility]    CSN: [id number] Arrival date & time: 08/21/17  1142     History   Chief Complaint Chief Complaint  Patient presents with  . Hand Pain    HPI Jennifer Holmes is a 31 y.o. female.   31 year old female presents with left long finger in. She states that about 3 days ago she noticed some redness surrounding the proximal and older aspect of the distal right long finger. There were the past 48 hours erythema and swelling has increased adjacent to the nail and involves the distal phalanx. No erythema to the pulp. Pain is throbbing and sharp. Nothing makes it better. Touching makes it worse. No known causative event such as recent cleaning or cutting.      Past Medical History:  Diagnosis Date  . Blood transfusion without reported diagnosis   . Fatty liver    per patient with last pregancy  . GERD (gastroesophageal reflux disease)   . Headache   . Hypertension   . PONV (postoperative nausea and vomiting)   . Preterm labor     Patient Active Problem List   Diagnosis Date Noted  . Vaginal bleeding in pregnancy, third trimester 07/17/2017  . History of preterm delivery, currently pregnant in second trimester 05/29/2017  . Supervision of high risk pregnancy, antepartum 04/15/2017  . History of vaginal delivery following previous cesarean delivery 04/15/2017  . History of cesarean delivery, currently pregnant 04/15/2017  . History of HELLP syndrome, currently pregnant 01/27/2015  . Chronic hypertension in pregnancy   . History of fatty infiltration of liver 09/12/2014    Past Surgical History:  Procedure Laterality Date  . CESAREAN SECTION      OB History    Gravida Para Term Preterm AB Living   0 3   SAB TAB Ectopic Multiple Live Births   0 0 0 0 3      Obstetric Comments   Had a blood transfusion with first pregnancy after her C/S in [place].       Home Medications    Prior to Admission medications   Medication Sig  Start Date End Date Taking? Authorizing Provider  amLODipine (NORVASC) 5 MG tablet Take 1 tablet (5 mg total) by mouth daily. 07/22/17  Yes Cox, Mariann Laster, MD  amoxicillin-clavulanate (AUGMENTIN) 875-125 MG tablet Take 1 tablet by mouth every 12 (twelve) hours. 08/21/17   Hayden Rasmussen, NP    Family History Family History  Problem Relation Age of Onset  . Hypertension Mother   . Heart disease Mother   . Hypertension Brother     Social History Social History  Substance Use Topics  . Smoking status: Never Smoker  . Smokeless tobacco: Never Used  . Alcohol use No     Allergies   Lactose intolerance (gi)   Review of Systems Review of Systems  Constitutional: Negative.   HENT: Negative.   Musculoskeletal: Negative.   Skin: Positive for color change.       As per history of present illness  Hematological: Negative.   All other systems reviewed and are negative.    Physical Exam Triage Vital Signs ED Triage Vitals  Enc Vitals Group     BP 08/21/17 1202 (!) 145/99     Pulse Rate 08/21/17 1202 84     Resp 08/21/17 1202 16     Temp 08/21/17 1202 98.3 F (36.8 C)     Temp Source 08/21/17 1202 Oral  SpO2 08/21/17 1202 98 %     Weight --      Height --      Head Circumference --      Peak Flow --      Pain Score 08/21/17 1212 7     Pain Loc --      Pain Edu? --      Excl. in GC? --    No data found.   Updated Vital Signs BP (!) 145/99 (BP Location: Left Arm) Comment: reported BP to CMA [facility] Wooters  Pulse 84   Temp 98.3 F (36.8 C) (Oral)   Resp 16   SpO2 98%   Breastfeeding? Yes   Visual Acuity Right Eye Distance:   Left Eye Distance:   Bilateral Distance:    Right Eye Near:   Left Eye Near:    Bilateral Near:     Physical Exam  Constitutional: She is oriented to person, place, and time. She appears well-developed and well-nourished. No distress.  HENT:  Head: Normocephalic and atraumatic.  Neck: Neck supple.  Cardiovascular: Normal rate.     Pulmonary/Chest: Effort normal.  Musculoskeletal: Normal range of motion. She exhibits tenderness.  Swelling, redness and tenderness along the base of the nail and ulnar aspect of the nail and portion of the distal phalanx. Appearance consistent with paronychia.  Neurological: She is alert and oriented to person, place, and time.  Skin: Skin is warm and dry. There is erythema.  Psychiatric: She has a normal mood and affect.  Nursing note and vitals reviewed.    UC Treatments / Results  Labs (all labs ordered are listed, but only abnormal results are displayed) Labs Reviewed - No data to display  EKG  EKG Interpretation None       Radiology No results found.  Procedures .Marland Kitchen.Incision and Drainage Date/Time: 08/21/2017 12:49 PM Performed by: Phineas RealMABE, Gina Leblond Authorized by: Mardella LaymanHAGLER, BRIAN   Consent:    Consent obtained:  Verbal   Consent given by:  Patient   Risks discussed:  Bleeding, pain and incomplete drainage Location:    Location:  Upper extremity   Upper extremity location:  Finger   Finger location:  L long finger Pre-procedure details:    Skin preparation:  Antiseptic wash Anesthesia (see MAR for exact dosages):    Anesthesia method:  Topical application Procedure type:    Complexity:  Simple Procedure details:    Incision types:  Stab incision   Incision depth:  Dermal   Scalpel blade:  11   Drainage:  Purulent and bloody   Drainage amount:  Moderate   Wound treatment:  Wound left open   Packing materials:  None Post-procedure details:    Patient tolerance of procedure:  Tolerated well, no immediate complications   (including critical care time)  Medications Ordered in UC Medications - No data to display   Initial Impression / Assessment and Plan / UC Course  I have reviewed the triage vital signs and the nursing notes.  Pertinent labs & imaging results that were available during my care of the patient were reviewed by me and considered in my medical  decision making (see chart for details).    May soak in warm water for the next day or 2. Keep the area covered with a Band-Aid for a couple days. Take Tylenol for pain. Keep elevated.    Final Clinical Impressions(s) / UC Diagnoses   Final diagnoses:  Paronychia of left middle finger    New Prescriptions New Prescriptions  AMOXICILLIN-CLAVULANATE (AUGMENTIN) 875-125 MG TABLET    Take 1 tablet by mouth every 12 (twelve) hours.     Controlled Substance Prescriptions [place] Controlled Substance Registry consulted? Not Applicable   Hayden Rasmussen, NP 08/21/17 1256

## 2017-08-21 NOTE — Discharge Instructions (Signed)
May soak in warm water for the next day or 2. Keep the area covered with a Band-Aid for a couple days. Take Tylenol for pain. Keep elevated.

## 2017-08-21 NOTE — ED Notes (Signed)
Dressing  Applied  To  Affected  Finger

## 2017-08-26 ENCOUNTER — Ambulatory Visit: Payer: Self-pay | Admitting: Medical

## 2017-09-09 ENCOUNTER — Encounter: Payer: Self-pay | Admitting: Family Medicine

## 2017-09-09 ENCOUNTER — Ambulatory Visit (INDEPENDENT_AMBULATORY_CARE_PROVIDER_SITE_OTHER): Payer: Self-pay | Admitting: Family Medicine

## 2017-09-09 MED ORDER — AMLODIPINE BESYLATE 5 MG PO TABS: 10.0000 mg | ORAL_TABLET | Freq: Every day | ORAL | 1 refills | Status: DC

## 2017-09-09 NOTE — Progress Notes (Signed)
Subjective:     Patient ID: Jennifer Holmes, female   DOB: 11-Apr-1986, 31 y.o.   MRN: [id number]  SVD 07/19/17. Chronic htn. Ran out of b/p med last wk. Today has been dizzy and some nausea     Review of Systems     Objective:   Physical Exam     Assessment:        Plan:

## 2017-09-09 NOTE — Progress Notes (Signed)
  Post Partum Visit  Jennifer Holmes is a 31 y.o. [phone] female who presents for a postpartum visit. She is 7 weeks postpartum following a VBAC after hospitalization for preterm labor and vaginal bleedin gin pregnancy.Pregnancy complicated by cHTN. She ran out of her amlodipine last week.    I have fully reviewed the prenatal and intrapartum course. The delivery was at [redacted]w[redacted]d gestational weeks.  Postpartum course has been unremarkable. Baby was initially cared for in NICU and came home 3 days ago.  Bleeding no bleeding. Bowel function is normal. Bladder function is normal. Patient is not sexually active. Contraception method is abstinence. Postpartum depression screening:neg.   Review of Systems Pertinent items are noted in HPI.    BP (!) 144/98 (BP Location: Right Arm, Patient Position: Sitting)   Temp 98.1 F (36.7 C)   Resp 18   Wt 194 lb (88 kg)   Breastfeeding? Yes   BMI 37.26 kg/m   Objective:  currently breastfeeding.  General:  alert and cooperative  HEENT  atraumatic, normocephalic  Lungs: clear to auscultation bilaterally  Heart:  regular rate and rhythm, S1, S2 normal, no murmur, click, rub or gallop  Abdomen: soft, non-tender; bowel sounds normal; no masses,  no organomegaly                   Psych: normal mood and affect                    Neuro: AAOx3, normal CN2-12 Assessment:   7  week postpartum exam. Pap smear not done at today's visit.   Plan:   1. Contraception: abstinence. Plans to go to health dept for contraception and husband is planning a vasectomy. 2. CHTN: blood pressure elevated today. Restart amlodipine at 10 mg daily. RTC in 2 weeks for BP check. 3. Follow up in: 2 weeks    Rolm Bookbinder, DO

## 2017-09-09 NOTE — Patient Instructions (Signed)
Instrucciones para la mamá sobre los cuidados en el hogar °(Home Care Instructions for Mom) °ACTIVIDAD °· Reanude sus actividades regulares de forma gradual. °· Descanse. Tome siestas cuando el bebé duerme. °· No levante objetos que pesen más de 10 libras (4,5 kg) hasta que el médico se lo autorice. °· Evite las actividades que demandan mucho esfuerzo y energía (que son extenuantes) hasta que el médico se lo autorice. Caminar a un ritmo tranquilo a moderado siempre es más seguro. °· Si tuvo un parto por cesárea: °? No pase la aspiradora, suba escaleras o conduzca un vehículo durante 4 o 6 semanas. °? Pídale a alguien que le brinde ayuda con las tareas domésticas hasta que pueda realizarlas por su cuenta. °? Haga ejercicios como se lo haya indicado el médico, si corresponde. ° °HEMORRAGIA VAGINAL °Probablemente continúe sangrando durante 4 o 6 semanas después del parto. Generalmente, la cantidad de sangre disminuye y el color se hace más claro con el transcurso del tiempo. Sin embargo, si usted está demasiado activa, el color de la sangre puede ser rojo brillante. Si necesita cambiarse la compresa higiénica en menos de una hora o tiene coágulos grandes: °· Permanezca acostada. °· Eleve los pies. °· Coloque compresas frías en la zona inferior del abdomen. °· Haga reposo. °· Comuníquese con su médico. °Si está amamantando, podría volver a tener su período entre las 8 semanas después del parto y el momento en que deje de amamantar. Si no está amamantando, volverá a tener su período 6 u 8 semanas después del parto. °CUIDADOS PERINEALES °La zona perineal o perineo, es la parte del cuerpo que se encuentra entre los muslos. Después del parto, esta zona necesita un cuidado especial. Siga las siguientes indicaciones como se lo haya indicado su médico. °· Tome baños de inmersión durante 15 o 20 minutos. °· Utilice apósitos o aerosoles analgésicos y cremas como se lo hayan indicado. °· No utilice tampones ni se haga duchas  vaginales hasta que el sangrado vaginal se haya detenido. °· Cada vez que vaya al baño: °? Use una botella perineal. °? Cámbiese el apósito. °? Use papel tisú en lugar de papel higiénico hasta que se cure la sutura. °· Haga ejercicios de Kegel todos los días. Los ejercicios Kegel ayudan a mantener los músculos que sostienen la vagina, la vejiga y los intestinos. Estos ejercicios se pueden realizar mientras está parada, sentada o acostada. Para hacer los ejercicios de Kegel: °? Tense los músculos del estómago y los que rodean el canal de parto. °? Mantenga esta posición durante unos segundos. °? Relájese. °? Repita hasta hacerlos 5 veces seguidas. °· Para evitar las hemorroides o que estas empeoren: °? Beba suficiente líquido para mantener la orina clara o de color amarillo pálido. °? Evite hacer fuerza al defecar. °? Tome los medicamentos y laxantes de venta libre como se lo haya indicado el médico. °CUIDADO DE LAS MAMAS °· Use un buen sostén. °· Evite tomar analgésicos de venta libre para las molestias de los pechos. °· Aplique hielo en los pechos para aliviar las molestias tanto como sea necesario: °? Ponga el hielo en una bolsa plástica. °? Coloque una toalla entre la piel y la bolsa de hielo. °? Aplique el hielo durante 20, o como se lo haya indicado el médico. ° °NUTRICIÓN °· Mantenga una dieta bien balanceada. °· No intente perder de peso rápidamente reduciendo el consumo de calorías. °· Tome sus vitaminas prenatales hasta el control de postparto o hasta que su médico se lo indique. ° °DEPRESIÓN POSTPARTO °  Puede sentir deseos de llorar sin motivo aparente y verse incapaz de enfrentarse a todos los cambios que implica tener un bebé. Este estado de ánimo se llama depresión postparto. La depresión postparto ocurre porque sus niveles hormonales sufren cambios después del parto. Si usted tiene depresión postparto, busque contención por parte de su pareja, sus amigos y su familia. Si la depresión no desaparece por  sí sola después de algunas semanas, concurra a su médico. °AUTOEXAMEN DE MAMAS °Realícese autoexámenes en el mismo momento cada mes. Si está amamantando, el mejor momento de controlar sus mamas es después de alimentar al bebé, cuando los pechos no están tan llenos. Si está amamantando y su período ya comenzó, controle sus mamas el día 5, 6 o 7 de su período. °Informe a su médico de cualquier protuberancia, bulto o secreción. Si está amamantando, las mamas normalmente tienen bultos. Esto es transitorio y no es un riesgo para la salud. °INTIMIDAD Y SEXUALIDAD °Debe evitar las relaciones sexuales durante al menos 3 o 4 semanas después del parto o hasta que el flujo de color rojo amarronado haya desaparecido completamente. Si no desea quedar embarazada nuevamente, use algún método anticonceptivo. Después del parto, puede quedar embarazada incluso si no ha tenido todavía el período. °SOLICITE ATENCIÓN MÉDICA SI: °· Se siente incapaz de controlar los cambios que implica tener un hijo y esos sentimientos no desaparecen después de algunas semanas. °· Detecta una protuberancia, bulto o secreción en sus mamas. ° °SOLICITE ATENCIÓN MÉDICA DE INMEDIATO SI: °· Debe cambiarse la compresa higiénica en 1 hora o menos. °· Tiene los siguientes síntomas: °? Dolor intenso o calambres en la parte inferior del abdomen. °? Una secreción vaginal con mal olor. °? Fiebre que no se alivia con los medicamentos. °? Una zona de la mama se pone roja y le causa dolor, y además usted tiene fiebre. °? Una pantorrilla enrojecida y con dolor. °? Repentino e intenso dolor en el pecho. °? Falta de aire. °? Micción dolorosa o con sangre. °? Problemas visuales. °· Vómitos durante 12 horas o más. °· Dolor de cabeza intenso. °· Tiene pensamientos serios acerca de lastimarse a usted misma o dañar al niño o a otra persona. ° °Esta información no tiene como fin reemplazar el consejo del médico. Asegúrese de hacerle al médico cualquier pregunta que  tenga. °Document Released: 11/26/2005 Document Revised: 03/19/2016 Document Reviewed: 05/30/2015 °Elsevier Interactive Patient Education © 2017 [employer] ° °

## 2017-09-23 ENCOUNTER — Ambulatory Visit: Payer: Self-pay | Admitting: General Practice

## 2017-09-23 VITALS — BP 122/84 | HR 71 | Ht 60.0 in | Wt 196.0 lb

## 2017-09-23 DIAGNOSIS — Z0130 Encounter for examination of blood pressure without abnormal findings: Secondary | ICD-10-CM

## 2017-09-23 NOTE — Progress Notes (Signed)
Agree with nursing staff's documentation of this patient's clinic encounter.  Vonzella Nipple, PA-C 09/23/2017 4:56 PM

## 2017-09-23 NOTE — Progress Notes (Signed)
Patient here for BP check today. Patient denies dizziness, headaches, or blurry vision. Reviewed patient's blood pressure with Vonzella Nipple who finds BP WNL. Patient should find PCP for further refills. Provided PCP info for patient. Patient verbalized understanding & had no questions

## 2017-10-19 ENCOUNTER — Encounter (HOSPITAL_COMMUNITY): Payer: Self-pay | Admitting: Emergency Medicine

## 2017-10-19 ENCOUNTER — Ambulatory Visit (HOSPITAL_COMMUNITY)
Admission: EM | Admit: 2017-10-19 | Discharge: 2017-10-19 | Disposition: A | Discharge status: Home / Self Care | Payer: Self-pay | Attending: Radiology | Admitting: Radiology

## 2017-10-19 DIAGNOSIS — H672 Otitis media in diseases classified elsewhere, left ear: Secondary | ICD-10-CM

## 2017-10-19 MED ORDER — AMOXICILLIN 500 MG PO TABS: 500.0000 mg | ORAL_TABLET | Freq: Two times a day (BID) | ORAL | 0 refills | Status: DC

## 2017-10-19 NOTE — ED Triage Notes (Signed)
Mouth pain for one week, last night noticed pain and swelling below left jaw.  Patient is currently breast feeding.

## 2017-10-19 NOTE — ED Provider Notes (Addendum)
[facility]    CSN: [id number] Arrival date & time: 10/19/17  1331     History   Chief Complaint Chief Complaint  Patient presents with  . Dental Pain    HPI Jennifer Holmes is a 31 y.o. female.   31 y.o. female presents with left ear pain X 1 week that is persisitet and worsening. Patient describes that pain initially in her ear and not radiates to her jaw and down her neck. Condition is acute in nature. Condition is made better by ntohing. Condition is made worse by talking and eating. Patient denies any relief from tylenol prior to there arrival at this facility. Patient is breastfeeding. Patient denies any fevers, nasal congestion or URI symptoms. Patient does endorse sick contacts      Past Medical History:  Diagnosis Date  . Blood transfusion without reported diagnosis   . Fatty liver    per patient with last pregancy  . GERD (gastroesophageal reflux disease)   . Headache   . Hypertension   . PONV (postoperative nausea and vomiting)   . Preterm labor     Patient Active Problem List   Diagnosis Date Noted  . Postpartum care and examination 09/09/2017  . History of preterm delivery, currently pregnant in second trimester 05/29/2017  . History of vaginal delivery following previous cesarean delivery 04/15/2017  . History of cesarean delivery, currently pregnant 04/15/2017  . History of HELLP syndrome, currently pregnant 01/27/2015  . Chronic hypertension in pregnancy   . History of fatty infiltration of liver 09/12/2014    Past Surgical History:  Procedure Laterality Date  . CESAREAN SECTION      OB History    Gravida Para Term Preterm AB Living   3 3 1 2  0 3   SAB TAB Ectopic Multiple Live Births   0 0 0 0 3      Obstetric Comments   Had a blood transfusion with first pregnancy [place]       Home Medications    Prior to Admission medications   Medication Sig Start Date End Date Taking? Authorizing Provider    amLODipine (NORVASC) 5 MG tablet Take 2 tablets (10 mg total) by mouth daily. 09/09/17   Rolm BookbinderMoss, Amber, DO    Family History Family History  Problem Relation Age of Onset  . Hypertension Mother   . Heart disease Mother   . Hypertension Brother     Social History Social History   Tobacco Use  . Smoking status: Never Smoker  . Smokeless tobacco: Never Used  Substance Use Topics  . Alcohol use: No  . Drug use: No     Allergies   Lactose intolerance (gi)   Review of Systems Review of Systems  Constitutional: Negative for fever.  HENT: Positive for dental problem ( pain in jaw) and ear pain ( left ).      Physical Exam Triage Vital Signs ED Triage Vitals  Enc Vitals Group     BP 10/19/17 1418 124/90     Pulse Rate 10/19/17 1418 83     Resp 10/19/17 1418 10     Temp 10/19/17 1418 97.9 F (36.6 C)     Temp Source 10/19/17 1418 Oral     SpO2 10/19/17 1418 97 %     Weight --      Height --      Head Circumference --      Peak Flow --  Pain Score 10/19/17 1417 5     Pain Loc --      Pain Edu? --      Excl. in GC? --    No data found.  Updated Vital Signs BP 124/90 (BP Location: Left Arm)   Pulse 83   Temp 97.9 F (36.6 C) (Oral)   Resp 10   LMP 07/19/2017 Comment: breast feeding  SpO2 97%   Visual Acuity Right Eye Distance:   Left Eye Distance:   Bilateral Distance:    Right Eye Near:   Left Eye Near:    Bilateral Near:     Physical Exam  Constitutional: She is oriented to person, place, and time. She appears well-developed and well-nourished.  HENT:  Head: Normocephalic and atraumatic.  Pain with palpation to submandibular lymph node on left side.   Eyes: Conjunctivae are normal.  Neck: Normal range of motion.  Pulmonary/Chest: Effort normal.  Neurological: She is alert and oriented to person, place, and time.  Skin: Skin is warm and dry.  Psychiatric: She has a normal mood and affect.  Nursing note and vitals reviewed.    UC  Treatments / Results  Labs (all labs ordered are listed, but only abnormal results are displayed) Labs Reviewed - No data to display  EKG  EKG Interpretation None       Radiology No results found.  Procedures Procedures (including critical care time)  Medications Ordered in UC Medications - No data to display   Initial Impression / Assessment and Plan / UC Course  I have reviewed the triage vital signs and the nursing notes.  Pertinent labs & imaging results that were available during my care of the patient were reviewed by me and considered in my medical decision making (see chart for details).       Final Clinical Impressions(s) / UC Diagnoses   Final diagnoses:  None    ED Discharge Orders    None       Controlled Substance Prescriptions [place] Controlled Substance Registry consulted? Not Applicable   Alene MiresOmohundro, Marilena Trevathan C, NP 10/19/17 1541    Alene Miresmohundro, Carreen Milius C, NP 10/19/17 1542

## 2017-12-18 ENCOUNTER — Encounter: Payer: Self-pay | Admitting: Nurse Practitioner

## 2017-12-18 ENCOUNTER — Ambulatory Visit: Payer: Self-pay | Attending: Nurse Practitioner | Admitting: Nurse Practitioner

## 2017-12-18 ENCOUNTER — Ambulatory Visit: Payer: Self-pay

## 2017-12-18 VITALS — BP 138/90 | HR 79 | Temp 98.2°F | Resp 12 | Ht 64.0 in | Wt 200.0 lb

## 2017-12-18 DIAGNOSIS — Z79899 Other long term (current) drug therapy: Secondary | ICD-10-CM | POA: Insufficient documentation

## 2017-12-18 DIAGNOSIS — I10 Essential (primary) hypertension: Secondary | ICD-10-CM | POA: Insufficient documentation

## 2017-12-18 DIAGNOSIS — R399 Unspecified symptoms and signs involving the genitourinary system: Secondary | ICD-10-CM

## 2017-12-18 LAB — POCT URINALYSIS DIPSTICK
Bilirubin, UA: NEGATIVE
Glucose, UA: NEGATIVE
Ketones, UA: NEGATIVE
Nitrite, UA: NEGATIVE
Protein, UA: NEGATIVE
Spec Grav, UA: 1.02 (ref 1.010–1.025)
Urobilinogen, UA: 0.2 E.U./dL
pH, UA: 5.5 (ref 5.0–8.0)

## 2017-12-18 NOTE — Patient Instructions (Addendum)
Infeccin urinaria en los adultos (Urinary Tract Infection, Adult) Una infeccin urinaria (IU) puede ocurrir en Corporate treasurer de las vas [place]. Las vas urinarias incluyen lo siguiente:  Riones.  Urteres.  Vejiga.  Uretra. Estos rganos fabrican, Barrister's clerk y eliminan la orina del organismo. CUIDADOS EN EL HOGAR  Tome los medicamentos de venta libre y los recetados solamente como se lo haya indicado el mdico.  Si le recetaron un antibitico, tmelo como se lo haya indicado el mdico. No deje de tomar los antibiticos aunque comience a sentirse mejor.  Evite beber lo siguiente: ? Alcohol. ? Cafena. ? T. ? Bebidas con gas.  Beba suficiente lquido para mantener el pis claro o de color amarillo plido.  Concurra a todas las visitas de control como se lo haya indicado el mdico. Esto es importante.  Asegrese de lo siguiente: ? Vaciar la vejiga con frecuencia y en su totalidad. No contener la orina durante largos perodos. ? Vaciar la vejiga antes y despus de Management consultant. ? Limpiar de adelante hacia atrs despus de defecar, si es mujer. Usar cada trozo de papel una vez cuando se limpie. SOLICITE AYUDA SI:  Siente dolor en la espalda.  Tiene fiebre.  Siente malestar estomacal (nuseas).  Vomita.  Los sntomas no mejoran despus de 3das de [place].  Los sntomas desaparecen y Stage manager. SOLICITE AYUDA DE INMEDIATO SI:  Siente un dolor muy intenso en la espalda.  Siente un dolor muy intenso en la parte inferior del abdomen.  Tiene vmitos y no puede retener los medicamentos ni el agua. Esta informacin no tiene Theme park manager el consejo del mdico. Asegrese de hacerle al mdico cualquier pregunta que tenga. Document Released: 05/16/2010 Document Revised: 03/19/2016 Document Reviewed: 10/17/2015 Elsevier Interactive Patient Education  2018 [employer].  Infeccin urinaria en los adultos (Urinary Tract Infection,  Adult) Una infeccin urinaria (IU) puede ocurrir en Corporate treasurer de las vas urinarias. Las vas urinarias incluyen lo siguiente:  Riones.  Urteres.  Vejiga.  Uretra. Estos rganos fabrican, Barrister's clerk y eliminan la orina del organismo. CUIDADOS EN EL HOGAR  Tome los medicamentos de venta libre y los recetados solamente como se lo haya indicado el mdico.  Si le recetaron un antibitico, tmelo como se lo haya indicado el mdico. No deje de tomar los antibiticos aunque comience a sentirse mejor.  Evite beber lo siguiente: ? Alcohol. ? Cafena. ? T. ? Bebidas con gas.  Beba suficiente lquido para mantener el pis claro o de color amarillo plido.  Concurra a todas las visitas de control como se lo haya indicado el mdico. Esto es importante.  Asegrese de lo siguiente: ? Vaciar la vejiga con frecuencia y en su totalidad. No contener la orina durante largos perodos. ? Vaciar la vejiga antes y despus de Management consultant. ? Limpiar de adelante hacia atrs despus de defecar, si es mujer. Usar cada trozo de papel una vez cuando se limpie. SOLICITE AYUDA SI:  Siente dolor en la espalda.  Tiene fiebre.  Siente malestar estomacal (nuseas).  Vomita.  Los sntomas no mejoran despus de 3das de [place].  Los sntomas desaparecen y Stage manager. SOLICITE AYUDA DE INMEDIATO SI:  Siente un dolor muy intenso en la espalda.  Siente un dolor muy intenso en la parte inferior del abdomen.  Tiene vmitos y no puede retener los medicamentos ni el agua. Esta informacin no tiene Theme park manager el consejo del mdico. Asegrese de hacerle al mdico cualquier pregunta que tenga. Document Released: 05/16/2010  Document Revised: 03/19/2016 Document Reviewed: 10/17/2015 Elsevier Interactive Patient Education  2018 [employer].  Plan de alimentacin DASH DASH Eating Plan DASH es la sigla en ingls de "Enfoques Alimentarios para Detener la Hipertensin"  (Dietary Approaches to Stop Hypertension). El plan de alimentacin DASH ha demostrado bajar la presin arterial elevada (hipertensin). Tambin puede reducir [employer] de diabetes tipo 2, enfermedad cardaca y accidente cerebrovascular. Este plan tambin puede ayudar a Geophysical data processor. Consejos para seguir este plan Pautas generales  Evite ingerir ms de 2,300 mg (miligramos) de sal (sodio) por da. Si tiene hipertensin, es posible que necesite reducir la ingesta de sodio a 1,500 mg por da.  Limite el consumo de alcohol a no ms de por da si es mujer y no est [place], y por da si es hombre. Una medida equivale a 12oz ( ) de cerveza, 5oz ( ) de vino o 1oz (44ml) de bebidas alcohlicas de alta graduacin.  Trabaje con su mdico para mantener un peso saludable o perder [employer]. Pregntele cul es el peso recomendado para usted.  Realice al menos 30 minutos de ejercicio que haga que se acelere su corazn (ejercicio Magazine features editor) la [employer] de la [place]. Estas actividades pueden incluir caminar, nadar o andar en bicicleta.  Trabaje con su mdico o especialista en alimentacin y nutricin (nutricionista) para ajustar su plan alimentario a sus necesidades calricas personales. Lectura de las etiquetas de los alimentos  Verifique en las etiquetas de los alimentos, la cantidad de sodio por porcin. Elija alimentos con menos del 5 por ciento del valor diario de sodio. Generalmente, los alimentos con menos de 300 mg de sodio por porcin se encuadran dentro de este plan alimentario.  Para encontrar cereales integrales, busque la palabra "integral" como primera palabra en la lista de ingredientes. De compras  Compre productos en los que en su etiqueta diga: "bajo contenido de sodio" o "sin agregado de sal".  Compre alimentos frescos. Evite los alimentos enlatados y comidas precocidas o congeladas. Coccin  Evite agregar sal cuando cocine. Use hierbas o aderezos  sin sal, en lugar de sal de mesa o sal marina. Consulte al mdico o farmacutico antes de usar sustitutos de la sal.  No fra los alimentos. A la hora de cocinar los alimentos opte por hornearlos, hervirlos, grillarlos y asarlos a Patent attorney.  Cocine con aceites cardiosaludables, como oliva, canola, soja o girasol. Planificacin de las comidas   Consuma una dieta equilibrada, que incluya lo siguiente: ? 5o ms porciones de frutas y Warehouse manager. Trate de que la mitad del plato de cada comida sean frutas y verduras. ? Hasta 6 u 8 porciones de cereales integrales por da. ? Menos de 6 onzas de carne, aves o pescado Copy. Una porcin de 3 onzas de carne tiene casi el mismo tamao que un mazo de cartas. Un huevo equivale a 1 onza. ? Dos porciones de productos lcteos descremados por Futures trader. ? Una porcin de frutos secos, semillas o frijoles 5 veces por semana. ? Grasas cardiosaludables. Las grasas saludables llamadas cidos grasos omega-3 se encuentran en alimentos como semillas de lino y pescados de agua fra, como por ejemplo, sardinas, salmn y caballa.  Limite la cantidad que ingiere de los siguientes alimentos: ? Alimentos enlatados o envasados. ? Alimentos con alto contenido de grasa trans, como alimentos fritos. ? Alimentos con alto contenido de grasa saturada, como carne con grasa. ? Dulces, postres, bebidas azucaradas y otros alimentos con azcar agregada. ? Productos lcteos enteros.  No le agregue sal a los alimentos antes de probarlos.  Trate de comer al menos 2 comidas vegetarianas por semana.  Consuma ms comida casera y menos de restaurante, de bufs y comida rpida.  Cuando coma en un restaurante, pida que preparen su comida con menos sal o, en lo posible, sin nada de sal. Qu alimentos se recomiendan? Los alimentos enumerados a continuacin no constituyen Water quality scientist. Hable con el nutricionista sobre las mejores opciones alimenticias para  usted. Cereales Pan de salvado o integral. Pasta de salvado o integral. Arroz integral. Avena. Quinua. Trigo burgol. Cereales integrales y con bajo contenido de [place]. Pan pita. Galletitas de [place] con bajo contenido de [place] y [place]. Tortillas de [place] integral. Verduras Verduras frescas o congeladas (crudas, al vapor, asadas o grilladas). Jugos de tomate y verduras con bajo contenido de sodio o reducidos en sodio. Salsa y pasta de tomate con bajo contenido de sodio o reducidas en sodio. Verduras enlatadas con bajo contenido de sodio o reducidas en sodio. Frutas Todas las frutas frescas, congeladas o disecadas. Frutas enlatadas en jugo natural (sin agregado de azcar). Carne y otros alimentos proteicos Pollo o pavo sin piel. Carne de pollo o de [place]. Cerdo desgrasado. Pescado y [employer]. Claras de huevo. Porotos, guisantes o lentejas secos. Frutos secos, mantequilla de frutos secos y semillas sin sal. Frijoles enlatados sin sal. Cortes de carne vacuna magra, desgrasada. Embutidos magros, con bajo contenido de [place]. Lcteos Leche descremada (1%) o descremada. Quesos sin grasa, con bajo contenido de grasa o descremados. Queso blanco o ricota sin grasa, con bajo contenido de [place]. Yogur semidescremado o descremado. Queso con bajo contenido de [place]) y [place]. Grasas y [employer] untables que no contengan grasas trans. Aceite vegetal. Jerolyn Shin y aderezos para ensaladas livianos o con bajo contenido de grasas (reducidos en sodio). Aceite de canola, crtamo, oliva, soja y [place]. [place]. Condimentos y otros alimentos Hierbas. Especias. Mezclas de condimentos sin sal. Palomitas de maz y pretzels sin sal. Dulces con bajo contenido de grasas. Qu alimentos no se recomiendan? Los alimentos enumerados a continuacin no constituyen Water quality scientist. Hable con el nutricionista sobre las mejores opciones alimenticias para usted. Cereales Productos de panificacin hechos con grasa, como  medialunas, magdalenas y algunos panes. Comidas con arroz o pasta seca listas para usar. Verduras Verduras con crema o fritas. Verduras en salsa de [place]. Verduras enlatadas regulares (que no sean con bajo contenido de sodio o reducidas en sodio). Pasta y salsa de tomates enlatadas regulares (que no sean con bajo contenido de sodio o reducidas en sodio). Jugos de tomate y verduras regulares (que no sean con bajo contenido de sodio o reducidos en sodio). Pepinillos. Aceitunas. Nils Pyle Fruta enlatada en almbar liviano o espeso. Frutas cocidas en aceite. Frutas con salsa de crema o [place]. Carne y otros alimentos proteicos Cortes de carne con grasa. Costillas. Carne frita. Tocino. Salchichas. Mortadela y otras carnes procesadas. Salame. Panceta. Perros calientes (hotdogs). Salchicha de cerdo. Frutos secos y semillas con sal. Frijoles enlatados con agregado de sal. Pescado enlatado o ahumado. Huevos enteros o yemas. Pollo o pavo con piel. Lcteos Leche entera o al 2%, crema y [address] y mitad crema. Queso crema entero o con toda su grasa. Yogur entero o endulzado. Quesos con toda su grasa. Sustitutos de cremas no lcteas. Coberturas batidas. Quesos para untar y quesos procesados. Grasas y [employer]. Margarina en barra. Manteca de cerdo. Materia grasa. Mantequilla clarificada. Grasa de panceta. Aceites tropicales como aceite de coco, palmiste  o palma. Condimentos y otros alimentos Palomitas de maz y pretzels con sal. Sal de cebolla, sal de ajo, sal condimentada, sal de mesa y sal marina. Salsa [place]. Salsa trtara. Salsa barbacoa. Salsa teriyaki. Salsa de soja, incluso la que tiene contenido reducido de [place]. Salsa de carne. Salsas en lata y envasadas. Salsa de pescado. Salsa de [place]. Salsa rosada. Rbano picante envasado. [place]. Mostaza. Saborizantes y tiernizantes para carne. Caldo en cubitos. Salsa picante y salsa tabasco. Escabeches envasados o ya preparados. Aderezos para  tacos prefabricados o envasados. Salsas. Aderezos comunes para ensalada. Dnde encontrar ms informacin:  [employer] del [address], los Pulmones y Risk manager (National Heart, Lung, and Blood Institute): [email or web address]  Asociacin Estadounidense del Corazn (American Heart Association): www.heart.org Resumen  El plan de alimentacin DASH ha demostrado bajar la presin arterial elevada (hipertensin). Tambin puede reducir [employer] de diabetes tipo 2, enfermedad cardaca y accidente cerebrovascular.  Con el plan de alimentacin DASH, deber limitar el consumo de sal (sodio) a 2,300 mg por da. Si tiene hipertensin, es posible que necesite reducir la ingesta de sodio a 1,500 mg por da.  Cuando siga el plan de alimentacin DASH, trate de comer ms frutas frescas y verduras, cereales integrales, carnes magras, lcteos descremados y grasas cardiosaludables.  Trabaje con su mdico o especialista en alimentacin y nutricin (nutricionista) para ajustar su plan alimentario a sus necesidades calricas personales. Esta informacin no tiene Theme park manager el consejo del mdico. Asegrese de hacerle al mdico cualquier pregunta que tenga. Document Released: 11/15/2011 Document Revised: 03/18/2017 Document Reviewed: 03/18/2017 Elsevier Interactive Patient Education  2018 [employer].  Plan de alimentacin DASH DASH Eating Plan DASH es la sigla en ingls de "Enfoques Alimentarios para Detener la Hipertensin" (Dietary Approaches to Stop Hypertension). El plan de alimentacin DASH ha demostrado bajar la presin arterial elevada (hipertensin). Tambin puede reducir [employer] de diabetes tipo 2, enfermedad cardaca y accidente cerebrovascular. Este plan tambin puede ayudar a Geophysical data processor. Consejos para seguir este plan Pautas generales  Evite ingerir ms de 2,300 mg (miligramos) de sal (sodio) por da. Si tiene hipertensin, es posible que necesite reducir la ingesta de sodio a 1,500 mg por  da.  Limite el consumo de alcohol a no ms de por da si es mujer y no est [place], y por da si es hombre. Una medida equivale a 12oz ( ) de cerveza, 5oz ( ) de vino o 1oz (44ml) de bebidas alcohlicas de alta graduacin.  Trabaje con su mdico para mantener un peso saludable o perder [employer]. Pregntele cul es el peso recomendado para usted.  Realice al menos 30 minutos de ejercicio que haga que se acelere su corazn (ejercicio Magazine features editor) la [employer] de la [place]. Estas actividades pueden incluir caminar, nadar o andar en bicicleta.  Trabaje con su mdico o especialista en alimentacin y nutricin (nutricionista) para ajustar su plan alimentario a sus necesidades calricas personales. Lectura de las etiquetas de los alimentos  Verifique en las etiquetas de los alimentos, la cantidad de sodio por porcin. Elija alimentos con menos del 5 por ciento del valor diario de sodio. Generalmente, los alimentos con menos de 300 mg de sodio por porcin se encuadran dentro de este plan alimentario.  Para encontrar cereales integrales, busque la palabra "integral" como primera palabra en la lista de ingredientes. De compras  Compre productos en los que en su etiqueta diga: "bajo contenido de sodio" o "sin agregado de sal".  Compre alimentos frescos. Evite los alimentos enlatados y  comidas precocidas o congeladas. Coccin  Evite agregar sal cuando cocine. Use hierbas o aderezos sin sal, en lugar de sal de mesa o sal marina. Consulte al mdico o farmacutico antes de usar sustitutos de la sal.  No fra los alimentos. A la hora de cocinar los alimentos opte por hornearlos, hervirlos, grillarlos y asarlos a Patent attorney.  Cocine con aceites cardiosaludables, como oliva, canola, soja o girasol. Planificacin de las comidas   Consuma una dieta equilibrada, que incluya lo siguiente: ? 5o ms porciones de frutas y Warehouse manager. Trate de que la mitad del  plato de cada comida sean frutas y verduras. ? Hasta 6 u 8 porciones de cereales integrales por da. ? Menos de 6 onzas de carne, aves o pescado Copy. Una porcin de 3 onzas de carne tiene casi el mismo tamao que un mazo de cartas. Un huevo equivale a 1 onza. ? Dos porciones de productos lcteos descremados por Futures trader. ? Una porcin de frutos secos, semillas o frijoles 5 veces por semana. ? Grasas cardiosaludables. Las grasas saludables llamadas cidos grasos omega-3 se encuentran en alimentos como semillas de lino y pescados de agua fra, como por ejemplo, sardinas, salmn y caballa.  Limite la cantidad que ingiere de los siguientes alimentos: ? Alimentos enlatados o envasados. ? Alimentos con alto contenido de grasa trans, como alimentos fritos. ? Alimentos con alto contenido de grasa saturada, como carne con grasa. ? Dulces, postres, bebidas azucaradas y otros alimentos con azcar agregada. ? Productos lcteos enteros.  No le agregue sal a los alimentos antes de probarlos.  Trate de comer al menos 2 comidas vegetarianas por semana.  Consuma ms comida casera y menos de restaurante, de bufs y comida rpida.  Cuando coma en un restaurante, pida que preparen su comida con menos sal o, en lo posible, sin nada de sal. Qu alimentos se recomiendan? Los alimentos enumerados a continuacin no constituyen Water quality scientist. Hable con el nutricionista sobre las mejores opciones alimenticias para usted. Cereales Pan de salvado o integral. Pasta de salvado o integral. Arroz integral. Avena. Quinua. Trigo burgol. Cereales integrales y con bajo contenido de [place]. Pan pita. Galletitas de [place] con bajo contenido de [place] y [place]. Tortillas de [place] integral. Verduras Verduras frescas o congeladas (crudas, al vapor, asadas o grilladas). Jugos de tomate y verduras con bajo contenido de sodio o reducidos en sodio. Salsa y pasta de tomate con bajo contenido de sodio o reducidas en sodio.  Verduras enlatadas con bajo contenido de sodio o reducidas en sodio. Frutas Todas las frutas frescas, congeladas o disecadas. Frutas enlatadas en jugo natural (sin agregado de azcar). Carne y otros alimentos proteicos Pollo o pavo sin piel. Carne de pollo o de [place]. Cerdo desgrasado. Pescado y [employer]. Claras de huevo. Porotos, guisantes o lentejas secos. Frutos secos, mantequilla de frutos secos y semillas sin sal. Frijoles enlatados sin sal. Cortes de carne vacuna magra, desgrasada. Embutidos magros, con bajo contenido de [place]. Lcteos Leche descremada (1%) o descremada. Quesos sin grasa, con bajo contenido de grasa o descremados. Queso blanco o ricota sin grasa, con bajo contenido de [place]. Yogur semidescremado o descremado. Queso con bajo contenido de [place]) y [place]. Grasas y [employer] untables que no contengan grasas trans. Aceite vegetal. Jerolyn Shin y aderezos para ensaladas livianos o con bajo contenido de grasas (reducidos en sodio). Aceite de canola, crtamo, oliva, soja y [place]. [place]. Condimentos y otros alimentos Hierbas. Especias. Mezclas de condimentos sin sal. Palomitas de maz  y pretzels sin sal. Dulces con bajo contenido de grasas. Qu alimentos no se recomiendan? Los alimentos enumerados a continuacin no constituyen Water quality scientist. Hable con el nutricionista sobre las mejores opciones alimenticias para usted. Cereales Productos de panificacin hechos con grasa, como medialunas, magdalenas y algunos panes. Comidas con arroz o pasta seca listas para usar. Verduras Verduras con crema o fritas. Verduras en salsa de [place]. Verduras enlatadas regulares (que no sean con bajo contenido de sodio o reducidas en sodio). Pasta y salsa de tomates enlatadas regulares (que no sean con bajo contenido de sodio o reducidas en sodio). Jugos de tomate y verduras regulares (que no sean con bajo contenido de sodio o reducidos en sodio). Pepinillos. Aceitunas. Nils Pyle Fruta  enlatada en almbar liviano o espeso. Frutas cocidas en aceite. Frutas con salsa de crema o [place]. Carne y otros alimentos proteicos Cortes de carne con grasa. Costillas. Carne frita. Tocino. Salchichas. Mortadela y otras carnes procesadas. Salame. Panceta. Perros calientes (hotdogs). Salchicha de cerdo. Frutos secos y semillas con sal. Frijoles enlatados con agregado de sal. Pescado enlatado o ahumado. Huevos enteros o yemas. Pollo o pavo con piel. Lcteos Leche entera o al 2%, crema y [address] y mitad crema. Queso crema entero o con toda su grasa. Yogur entero o endulzado. Quesos con toda su grasa. Sustitutos de cremas no lcteas. Coberturas batidas. Quesos para untar y quesos procesados. Grasas y [employer]. Margarina en barra. Manteca de cerdo. Materia grasa. Mantequilla clarificada. Grasa de panceta. Aceites tropicales como aceite de coco, palmiste o palma. Condimentos y otros alimentos Palomitas de maz y pretzels con sal. Sal de cebolla, sal de ajo, sal condimentada, sal de mesa y sal marina. Salsa Worcestershire. Salsa trtara. Salsa barbacoa. Salsa teriyaki. Salsa de soja, incluso la que tiene contenido reducido de [place]. Salsa de carne. Salsas en lata y envasadas. Salsa de pescado. Salsa de [place]. Salsa rosada. Rbano picante envasado. [place]. Mostaza. Saborizantes y tiernizantes para carne. Caldo en cubitos. Salsa picante y salsa tabasco. Escabeches envasados o ya preparados. Aderezos para tacos prefabricados o envasados. Salsas. Aderezos comunes para ensalada. Dnde encontrar ms informacin:  [employer] del [address], los Pulmones y Risk manager (National Heart, Lung, and Blood Institute): [email or web address]  Asociacin Estadounidense del Corazn (American Heart Association): www.heart.org Resumen  El plan de alimentacin DASH ha demostrado bajar la presin arterial elevada (hipertensin). Tambin puede reducir [employer] de diabetes tipo 2, enfermedad cardaca y  accidente cerebrovascular.  Con el plan de alimentacin DASH, deber limitar el consumo de sal (sodio) a 2,300 mg por da. Si tiene hipertensin, es posible que necesite reducir la ingesta de sodio a 1,500 mg por da.  Cuando siga el plan de alimentacin DASH, trate de comer ms frutas frescas y verduras, cereales integrales, carnes magras, lcteos descremados y grasas cardiosaludables.  Trabaje con su mdico o especialista en alimentacin y nutricin (nutricionista) para ajustar su plan alimentario a sus necesidades calricas personales. Esta informacin no tiene Theme park manager el consejo del mdico. Asegrese de hacerle al mdico cualquier pregunta que tenga. Document Released: 11/15/2011 Document Revised: 03/18/2017 Document Reviewed: 03/18/2017 Elsevier Interactive Patient Education  2018 [employer].  Opciones de alimentos para pacientes con reflujo gastroesofgico - Adultos (Food Choices for Gastroesophageal Reflux Disease, Adult) Cuando se tiene reflujo gastroesofgico (ERGE), los alimentos que se ingieren y los hbitos de alimentacin son Engineer, production. Elegir los alimentos adecuados puede ayudar a [employer]. QU PAUTAS DEBO SEGUIR?  Elija las frutas, los vegetales, los cereales integrales y  los productos lcteos con bajo contenido de [place].  Elija las carnes de [place], de pescado y de ave con bajo contenido de grasas.  Limite las grasas, [address] [place], los aderezos para [place], la [place], los frutos secos y Programme researcher, broadcasting/film/video.  Lleve un registro de alimentos. Esto ayuda a identificar los alimentos que ocasionan sntomas.  Evite los alimentos que le ocasionen sntomas. Pueden ser distintos para cada persona.  Haga comidas pequeas durante Glass blower/designer de 3 comidas abundantes.  Coma lentamente, en un lugar donde est distendido.  Limite el consumo de alimentos fritos.  Cocine los alimentos utilizando mtodos que no sean la fritura.  Evite el consumo  alcohol.  Evite beber grandes cantidades de lquidos con las comidas.  Evite agacharse o recostarse hasta despus de 2 o 3horas de haber comido.  QU ALIMENTOS NO SE RECOMIENDAN? Estos son algunos alimentos y bebidas que pueden empeorar los sntomas: Veterinary surgeon. Jugo de tomate. Salsa de tomate y espagueti. Ajes. Cebolla y [place]. Rbano picante. Frutas Naran[place], pomelos y limn (fruta y [place]). Carnes Carnes de [place], de pescado y de ave con gran contenido de grasas. Esto incluye los perros calientes, las [place], el [place], la salchicha, el salame y el tocino. Lcteos Leche entera y [place]. [employer]. Crema. Mantequilla. Hel[place]. Queso crema. Bebidas T o caf. Bebidas gaseosas o bebidas energizantes. Condimentos Salsa picante. Salsa barbacoa. Dulces/postres Chocolate y cacao. Rosquillas. Menta y mentol. Grasas y [employer]. Esto incluye las papas fritas. Otros Vinagre. Especias picantes. Esto incluye la pimienta negra, la pimienta blanca, la pimienta roja, la pimienta de cayena, el curry en polvo, los clavos de Roosevelt, el jengibre y el [place] en polvo. Esta no es Raytheon de los alimentos y las bebidas que se Theatre stage manager. Comunquese con el nutricionista para recibir ms informacin. Esta informacin no tiene Theme park manager el consejo del mdico. Asegrese de hacerle al mdico cualquier pregunta que tenga. Document Released: 05/27/2012 Document Revised: 12/17/2014 Document Reviewed: 09/30/2013 Elsevier Interactive Patient Education  2017 Elsevier Inc.  Merchant navy officer (Heartburn) La acidez estomacal es un tipo de dolor o de molestia que se puede presentar en la garganta o en el pecho. A menudo se la describe como Actuary. Tambin puede producir mal aliento. La sensacin de [employer] al acostarse o inclinarse. Puede deberse al retroceso (reflujo) de los contenidos estomacales hacia el tubo que conecta la boca con  el estmago (esfago). CUIDADOS EN EL HOGAR Tome estas medidas para aliviar las molestias y [employer]. Dieta  Siga la dieta como se lo haya indicado el mdico. Tal vez deba evitar los siguientes alimentos y bebidas: ? Caf y t (con o sin cafena). ? Bebidas que contengan alcohol. ? Bebidas energizantes y deportivas. ? Gaseosas o refrescos. ? Chocolate y cacao. ? Menta y esencias de [address] ? Ajo y cebollas. ? Rbano picante. ? Alimentos muy condimentados y cidos, como pimientos, [place] en polvo, curry en polvo, vinagre, salsas picantes y Engineer, water. ? Frutas ctricas y sus jugos, como naranjas, limones y limas. ? Alimentos a base de tomates, como salsa roja, [place], salsa y pizza con salsa roja. ? Alimentos fritos y Lexicographer, como rosquillas, papas fritas y aderezos con alto contenido de Holiday representative. ? Carnes con alto contenido de [place], como hot dogs, filetes de entrecot, salchicha, jamn y tocino. ? Productos lcteos con alto contenido de grasa, como [place], [place] y Cable crema.  Consuma pequeas porciones de comida  con ms frecuencia. Evite consumir porciones abundantes.  Evite beber mucho lquido con las comidas.  No coma durante las 2 o 3horas previas a la hora de Duncanacostarse.  No se acueste inmediatamente despus de comer.  No haga actividad fsica enseguida despus de comer. Instrucciones generales  Est atento a cualquier cambio en los sntomas.  Tome los medicamentos de venta libre y los recetados solamente como se lo haya indicado el mdico. No tome aspirina, ibuprofeno ni otros antiinflamatorios no esteroides (AINE), a menos que el mdico lo autorice.  No consuma ningn producto que contenga tabaco, lo que incluye cigarrillos, tabaco de Theatre managermascar y Administrator, Civil Servicecigarrillos electrnicos. Si necesita ayuda para dejar de fumar, consulte al mdico.  Use ropa suelta. No use nada ajustado alrededor Reynolds Americande la cintura.  Levante (eleve) unas 6pulgadas (15centmetros) la  cabecera de la cama.  Intente bajar el nivel de estrs. Si necesita ayuda para hacerlo, consulte al American Expressmdico.  Si tiene sobrepeso, Media planneradelgace hasta alcanzar un peso saludable. Pregntele a su mdico cmo puede perder peso de manera segura.  Concurra a todas las visitas de control como se lo haya indicado el mdico. Esto es importante. SOLICITE AYUDA SI:  Aparecen nuevos sntomas.  Baja de Germantownpeso y no sabe por qu.  Tiene dificultad para tragar o siente dolor al Darden Restaurantshacerlo.  Tiene sibilancias o tos que no desaparece.  Los sntomas no mejoran con Scientist, research (medical)el tratamiento.  Tiene acidez frecuentemente durante ms de Marsh & McLennandos semanas. SOLICITE AYUDA DE INMEDIATO SI:  Tiene dolor en los brazos, el cuello, los Freelandmaxilares, la dentadura o la espalda.  Berenice Primasranspira, se marea o tiene sensacin de desvanecimiento.  Siente falta de aire o Journalist, newspaperdolor en el pecho.  Vomita y el vmito es parecido a la sangre o a los granos de caf.  Las heces son sanguinolentas o de color negro. Esta informacin no tiene Theme park managercomo fin reemplazar el consejo del mdico. Asegrese de hacerle al mdico cualquier pregunta que tenga. Document Released: 08/08/2011 Document Revised: 08/17/2015 Document Reviewed: 03/23/2015 Elsevier Interactive Patient Education  2018 ArvinMeritorElsevier Inc.  Dieta suave (ChantillyBland Diet) La dieta suave se compone de alimentos que no contienen mucha grasa o Whartonfibra. Para el cuerpo, es ms fcil digerir los alimentos con bajo contenido de [place]grasa o de Brunsonfibra. Adems, es menos probable que estos causen Sanmina-SCIirritacin en la boca, la garganta, el estmago y otras partes del tubo digestivo. A menudo, se conoce a la dieta suave como dieta BRAT (por sus siglas en ingls). EN QU CONSISTE EL PLAN? El mdico o el nutricionista pueden recomendar cambios especficos en la dieta para evitar y tratar los sntomas, por ejemplo:  Consumir pequeas cantidades de comida con frecuencia.  Cocinar los alimentos hasta que estn lo bastante blandos para  masticarlos con facilidad.  Masticar bien la comida.  Beber lquidos lentamente.  No consumir alimentos muy picantes, cidos o grasosos.  No comer frutas ctricas, como naranjas y pomelos. QU DEBO SABER ACERCA DE ESTA DIETA?  Consuma diferentes alimentos de lista de alimentos de la dieta North Crossettsuave.  No siga una dieta suave durante ms tiempo del Onleydebido.  Pregntele al mdico si debe tomar vitaminas. QU ALIMENTOS PUEDO COMER? Cereales Cereales calientes, como crema de trigo. Panes, galletas o tortillas elaborados con harina blanca refinada. Arroz. Verduras Verduras cocidas o enlatadas. Pur de papas o papas hervidas. Nils PyleFrutas Bananas. Pur de Praxairmanzana. Otros tipos de frutas cocidas o enlatadas peladas y sin semillas, por ejemplo, duraznos o peras en lata. Carnes y otras fuentes de protenas Huevos revueltos. Mantequilla  de man cremosa u otras mantequillas de frutos secos. Carnes Corning Incorporated cocidas, como ave o pescado. Tofu. Sopas o caldos. Lcteos Productos lcteos sin grasa, como [place], queso cottage y Dentist. [employer]S Inc. T de hierbas. Jugo de [place]. Dulces y postres Pudin. Natillas. Gelatina de frutas. Helados. Grasas y aceites Aderezos suaves para ensaladas. Aceite de canola o de oliva. Esta no es Raytheon de los alimentos o las bebidas permitidos. Comunquese con el nutricionista para conocer ms opciones. QU ALIMENTOS NO SE RECOMIENDAN? Los alimentos y los ingredientes que frecuentemente no se recomiendan incluyen los siguientes:  Alimentos picantes, como salsas picantes.  Comidas fritas.  Alimentos cidos, como encurtidos o alimentos fermentados.  Verduras o frutas crudas, especialmente ctricos o frutos del bosque.  Bebidas que contengan cafena.  Alcohol.  Aderezos o condimentos muy saborizados. Es posible que los productos que se enumeran ms arriba no sean una lista completa de los alimentos y las bebidas que no estn permitidos. Comunquese  con el nutricionista para obtener ms informacin. Esta informacin no tiene Theme park manager el consejo del mdico. Asegrese de hacerle al mdico cualquier pregunta que tenga. Document Released: 03/19/2016 Document Revised: 03/19/2016 Document Reviewed: 12/08/2014 Elsevier Interactive Patient Education  2018 ArvinMeritor.

## 2017-12-18 NOTE — Progress Notes (Signed)
Assessment & Plan:  [name] was seen today for new patient (initial visit).  Diagnoses and all orders for this visit:  Essential hypertension  UTI symptoms -     Urinalysis Dipstick   Patient has been counseled on age-appropriate routine health concerns for screening and prevention. These are reviewed and up-to-date. Referrals have been placed accordingly. Immunizations are up-to-date or declined.    Subjective:   Chief Complaint  Patient presents with  . New Patient (Initial Visit)    Patient is here for hypertension. Patient stated when she was in vacation in [place], and went to Emergency room . Patient stated that she peed blood and protein. Patient stated she still having pain when she urinates.   HPI Jennifer Holmes 32 y.o. female presents to office today to establish care as a new patient.    Essential Hypertension She stopped taking amlodipine 3 weeks ago. She does not check her blood pressure at home. She does have a blood pressure cuff. She declines starting any antihypertensive today.  Will recheck bp in 2-3 weeks. If still borderline will need to restart amlodipine. She is agreeable. Denies chest pain, shortness of breath, palpitations, lightheadedness, dizziness, headaches or BLE edema.  BP Readings from Last 3 Encounters:  12/18/17 138/90  10/19/17 124/90  09/23/17 122/84      UTI Symptoms She endorses dysuria. She was seen in the Urgent Care 2 weeks ago in [place] for dysuria for bilateral flank pain. She reports hematuria at that time. She was prescribed flagyl, pyridium and tylenol but no UTI was diagnosed at that time. She currently denies fever, vulvar itching or discharge. She has not taken the flagyl as she reports there are no symptoms of yeast or BV. She reports the Tylenol helps to relieve her dysuria. She is no longer experiencing hematuria. She endorses frequency and incomplete bladder emptying. She is drinking plenty of water.     Review of Systems  Constitutional: Negative.  Negative for chills, fever and malaise/fatigue.  Respiratory: Negative.  Negative for cough and shortness of breath.   Cardiovascular: Negative.  Negative for chest pain, orthopnea and leg swelling.  Gastrointestinal: Negative.  Negative for abdominal pain, nausea and vomiting.  Genitourinary: Positive for dysuria and frequency.       SEE HPI  Musculoskeletal: Negative for back pain.  Skin: Negative.  Negative for itching and rash.  Neurological: Negative for dizziness and tingling.    Past Medical History:  Diagnosis Date  . Blood transfusion without reported diagnosis   . Fatty liver    per patient with last pregancy  . GERD (gastroesophageal reflux disease)   . Headache   . PONV (postoperative nausea and vomiting)   . Preterm labor     Past Surgical History:  Procedure Laterality Date  . CESAREAN SECTION      Family History  Problem Relation Age of Onset  . Hypertension Mother   . Heart disease Mother   . Hypertension Brother     Social History Reviewed with no changes to be made today.   Outpatient Medications Prior to Visit  Medication Sig Dispense Refill  . acetaminophen (TYLENOL) 325 MG tablet Take 650 mg by mouth every 6 (six) hours as needed.    . phenazopyridine (PYRIDIUM) 100 MG tablet Take 100 mg by mouth 3 (three) times daily as needed for pain.    Marland Kitchen amLODipine (NORVASC) 5 MG tablet Take 2 tablets (10 mg total) by mouth daily. (Patient not taking:  Reported on 12/18/2017) 30 tablet 1  . amoxicillin (AMOXIL) 500 MG tablet Take 1 tablet (500 mg total) 2 (two) times daily by mouth. (Patient not taking: Reported on 12/18/2017) 14 tablet 0   No facility-administered medications prior to visit.     Allergies  Allergen Reactions  . Lactose Intolerance (Gi) Other (See Comments)    Reaction:  GI upset       Objective:    BP 138/90 (BP Location: Right Arm, Patient Position: Sitting, Cuff Size: Normal)   Pulse  79   Temp 98.2 F (36.8 C) (Oral)   Resp 12   Ht 5\' 4"  (1.626 m)   Wt 200 lb (90.7 kg)   SpO2 97%   BMI 34.33 kg/m  Wt Readings from Last 3 Encounters:  12/18/17 200 lb (90.7 kg)  09/23/17 196 lb (88.9 kg)  09/09/17 194 lb (88 kg)    Physical Exam  Constitutional: She is oriented to person, place, and time. She appears well-developed and well-nourished.  HENT:  Head: Normocephalic and atraumatic.  Eyes: EOM are normal.  Neck: Normal range of motion. Neck supple.  Cardiovascular: Normal rate, regular rhythm and normal heart sounds. Exam reveals no gallop and no friction rub.  No murmur heard. Pulmonary/Chest: Effort normal and breath sounds normal. No respiratory distress. She has no wheezes. She has no rales.  Abdominal: Soft. Bowel sounds are normal. There is tenderness in the suprapubic area.  Neurological: She is alert and oriented to person, place, and time.  Skin: Skin is warm and dry.  Psychiatric: She has a normal mood and affect. Her behavior is normal. Judgment and thought content normal.       Patient has been counseled extensively about nutrition and exercise as well as the importance of adherence with medications and regular follow-up. The patient was given clear instructions to go to ER or return to medical center if symptoms don't improve, worsen or new problems develop. The patient verbalized understanding.   Follow-up: Return in about 3 weeks (around 01/08/2018) for PAP SMEAR.   Claiborne RiggZelda W Laurelin Elson, FNP-BC [facility] [place] , [place] [phone]   12/20/2017, 6:47 PM

## 2017-12-20 ENCOUNTER — Encounter: Payer: Self-pay | Admitting: Nurse Practitioner

## 2018-01-03 ENCOUNTER — Telehealth: Payer: Self-pay

## 2018-01-03 NOTE — Telephone Encounter (Signed)
Patient call to have her Amlodipine 5 mg refill.   Last office visit with Meredeth IdeFleming on 12/18/2017 with BP of 138/90.  Okay to refill patient Amlodipine 5 mg with 30 days supply and 3 refills?

## 2018-01-06 ENCOUNTER — Other Ambulatory Visit: Payer: Self-pay | Admitting: Nurse Practitioner

## 2018-01-06 MED ORDER — AMLODIPINE BESYLATE 10 MG PO TABS: 10.0000 mg | ORAL_TABLET | Freq: Every day | ORAL | 2 refills | Status: DC

## 2018-01-06 MED FILL — AMLODIPINE BESYLATE 10 MG T: 10 | 30 days supply | Qty: 30 | Fill #0

## 2018-01-06 NOTE — Telephone Encounter (Signed)
[facility]. If [facility] would like [facility] please resend to other pharmacy. Thank you

## 2018-01-06 NOTE — Telephone Encounter (Signed)
CMA called patient to let patient know RX has been refilled.

## 2018-01-10 ENCOUNTER — Other Ambulatory Visit: Payer: Self-pay | Admitting: Nurse Practitioner

## 2018-01-21 ENCOUNTER — Other Ambulatory Visit: Payer: Self-pay | Admitting: Nurse Practitioner

## 2018-04-02 ENCOUNTER — Ambulatory Visit: Payer: Self-pay | Attending: Nurse Practitioner | Admitting: Nurse Practitioner

## 2018-04-29 ENCOUNTER — Ambulatory Visit (INDEPENDENT_AMBULATORY_CARE_PROVIDER_SITE_OTHER): Payer: Self-pay | Admitting: Nurse Practitioner

## 2018-05-27 ENCOUNTER — Ambulatory Visit (INDEPENDENT_AMBULATORY_CARE_PROVIDER_SITE_OTHER): Payer: Self-pay | Admitting: Physician Assistant

## 2018-05-30 ENCOUNTER — Ambulatory Visit (INDEPENDENT_AMBULATORY_CARE_PROVIDER_SITE_OTHER): Payer: Self-pay | Admitting: Physician Assistant

## 2018-10-27 ENCOUNTER — Encounter (HOSPITAL_COMMUNITY): Payer: Self-pay

## 2019-08-27 ENCOUNTER — Other Ambulatory Visit: Payer: Self-pay

## 2019-08-27 DIAGNOSIS — Z20822 Contact with and (suspected) exposure to covid-19: Secondary | ICD-10-CM

## 2019-08-29 LAB — NOVEL CORONAVIRUS, NAA: SARS-CoV-2, NAA: DETECTED — AB

## 2020-01-14 ENCOUNTER — Other Ambulatory Visit: Payer: Self-pay

## 2020-01-14 ENCOUNTER — Ambulatory Visit: Payer: Self-pay | Attending: Nurse Practitioner | Admitting: Nurse Practitioner

## 2020-01-14 ENCOUNTER — Encounter: Payer: Self-pay | Admitting: Nurse Practitioner

## 2020-01-14 DIAGNOSIS — Z7689 Persons encountering health services in other specified circumstances: Secondary | ICD-10-CM

## 2020-01-14 DIAGNOSIS — K219 Gastro-esophageal reflux disease without esophagitis: Secondary | ICD-10-CM

## 2020-01-14 NOTE — Progress Notes (Signed)
Virtual Visit via Telephone Note Due to national recommendations of social distancing due to COVID 19, telehealth visit is felt to be most appropriate for this patient at this time.  I discussed the limitations, risks, security and privacy concerns of performing an evaluation and management service by telephone and the availability of in person appointments. I also discussed with the patient that there may be a patient responsible charge related to this service. The patient expressed understanding and agreed to proceed.    I connected with Jennifer Holmes on 01/14/20  at   3:30 PM EST  EDT by telephone and verified that I am speaking with the correct person using two identifiers.   Consent I discussed the limitations, risks, security and privacy concerns of performing an evaluation and management service by telephone and the availability of in person appointments. I also discussed with the patient that there may be a patient responsible charge related to this service. The patient expressed understanding and agreed to proceed.   Location of Patient: Private Residence   Location of Provider: [facility]    Persons participating in Telemedicine visit: Jennifer Denver FNP-BC [facility] [place] CMA Francis George Alcantar  Spanish Interpreter ID: [id number]   History of Present Illness: Telemedicine visit for: Establish Care  has a past medical history of Gastroesophageal Reflux Disease (GERD), Headache, HELLP syndrome, History of blood transfusion for PP hemorrhage, Postoperative Nausea and Vomiting (PONV), and Preterm labor.   GERD She denies abdominal bloating, bilious reflux, chest pain, choking on food, deep pressure at base of neck, difficulty swallowing, early satiety, fullness after meals, heartburn, hematemesis, hoarseness, laryngitis, melena, midespigastric pain, need to clear throat frequently, odynophagia, regurgitation of undigested food, shortness of  breath, upper abdominal discomfort, waterbrash and wheezing. Well controlled with diet.   Elevated BP  She stopped taking amlodipine over a year or so ago. States she feels good.  She was monitoring her blood pressure at home however her battery went dead on her monitor. She does have a blood pressure cuff. SBP 110-120s. She can not recall any diastolic readings. She declines restarting any antihypertensive today.  Will recheck bp in 2-3 weeks. If still borderline will need to restart amlodipine. She is agreeable. Denies chest pain, shortness of breath, palpitations, lightheadedness, dizziness, headaches or BLE edema.  BP Readings from Last 3 Encounters:  12/18/17 138/90  10/19/17 124/90  09/23/17 122/84     Past Medical History:  Diagnosis Date  . Gastroesophageal Reflux Disease (GERD)   . Headache   . HELLP syndrome   . History of blood transfusion for PP hemorrhage   . Postoperative Nausea and Vomiting (PONV)   . Preterm labor     Past Surgical History:  Procedure Laterality Date  . CESAREAN SECTION N/A 03/01/2008    Family History  Problem Relation Age of Onset  . Hypertension Mother   . Heart disease Mother   . Hypertension Brother     Social History   Socioeconomic History  . Marital status: Married    Spouse name: Not on file  . Number of children: Not on file  . Years of education: Not on file  . Highest education level: Not on file  Occupational History  . Not on file  Tobacco Use  . Smoking status: Never Smoker  . Smokeless tobacco: Never Used  Substance and Sexual Activity  . Alcohol use: No  . Drug use: No  . Sexual activity: Yes    Birth control/protection: None  Other Topics Concern  . Not on file  Social History Narrative  . Not on file   Social Determinants of Health   Financial Resource Strain:   . Difficulty of Paying Living Expenses: Not on file  Food Insecurity:   . Worried About Charity fundraiser in the Last Year: Not on file  . Ran  Out of Food in the Last Year: Not on file  Transportation Needs:   . Lack of Transportation (Medical): Not on file  . Lack of Transportation (Non-Medical): Not on file  Physical Activity:   . Days of Exercise per Week: Not on file  . Minutes of Exercise per Session: Not on file  Stress:   . Feeling of Stress : Not on file  Social Connections:   . Frequency of Communication with Friends and Family: Not on file  . Frequency of Social Gatherings with Friends and Family: Not on file  . Attends Religious Services: Not on file  . Active Member of Clubs or Organizations: Not on file  . Attends Archivist Meetings: Not on file  . Marital Status: Not on file     Observations/Objective: Awake, alert and oriented x 3   Review of Systems  Constitutional: Negative for fever, malaise/fatigue and weight loss.  HENT: Negative.  Negative for nosebleeds.   Eyes: Negative.  Negative for blurred vision, double vision and photophobia.  Respiratory: Negative.  Negative for cough and shortness of breath.   Cardiovascular: Negative.  Negative for chest pain, palpitations and leg swelling.  Gastrointestinal: Positive for heartburn. Negative for nausea and vomiting.  Musculoskeletal: Negative.  Negative for myalgias.  Neurological: Negative.  Negative for dizziness, focal weakness, seizures and headaches.  Psychiatric/Behavioral: Negative.  Negative for suicidal ideas.    Assessment and Plan: Nemiah was seen today for establish care.  Diagnoses and all orders for this visit:  Encounter to establish care  GERD without esophagitis Well controlled with diet.     Follow Up Instructions Return for Physical and PAP.     I discussed the assessment and treatment plan with the patient. The patient was provided an opportunity to ask questions and all were answered. The patient agreed with the plan and demonstrated an understanding of the instructions.   The patient was advised to call back  or seek an in-person evaluation if the symptoms worsen or if the condition fails to improve as anticipated.  I provided 13 minutes of non-face-to-face time during this encounter including median intraservice time, reviewing previous notes, labs, imaging, medications and explaining diagnosis and management.  Gildardo Pounds, FNP-BC

## 2020-01-21 ENCOUNTER — Ambulatory Visit: Payer: Self-pay | Attending: Nurse Practitioner

## 2020-01-21 ENCOUNTER — Other Ambulatory Visit: Payer: Self-pay

## 2020-01-21 DIAGNOSIS — Z7689 Persons encountering health services in other specified circumstances: Secondary | ICD-10-CM

## 2020-01-22 LAB — CMP14+EGFR
ALT: 26 IU/L (ref 0–32)
AST: 21 IU/L (ref 0–40)
Albumin/Globulin Ratio: 1.4 (ref 1.2–2.2)
Albumin: 4.2 g/dL (ref 3.8–4.8)
Alkaline Phosphatase: 83 IU/L (ref 39–117)
BUN/Creatinine Ratio: 18 (ref 9–23)
BUN: 8 mg/dL (ref 6–20)
Bilirubin Total: 0.5 mg/dL (ref 0.0–1.2)
CO2: 21 mmol/L (ref 20–29)
Calcium: 9 mg/dL (ref 8.7–10.2)
Chloride: 104 mmol/L (ref 96–106)
Creatinine, Ser: 0.44 mg/dL — ABNORMAL LOW (ref 0.57–1.00)
GFR calc Af Amer: 154 mL/min/{1.73_m2} (ref >59)
GFR calc non Af Amer: 134 mL/min/{1.73_m2} (ref >59)
Globulin, Total: 2.9 g/dL (ref 1.5–4.5)
Glucose: 75 mg/dL (ref 65–99)
Potassium: 4.1 mmol/L (ref 3.5–5.2)
Sodium: 139 mmol/L (ref 134–144)
Total Protein: 7.1 g/dL (ref 6.0–8.5)

## 2020-01-22 LAB — LIPID PANEL
Chol/HDL Ratio: 3.9 ratio (ref 0.0–4.4)
Cholesterol, Total: 167 mg/dL (ref 100–199)
HDL: 43 mg/dL (ref >39)
LDL Chol Calc (NIH): 91 mg/dL (ref 0–99)
Triglycerides: 196 mg/dL — ABNORMAL HIGH (ref 0–149)
VLDL Cholesterol Cal: 33 mg/dL (ref 5–40)

## 2020-01-22 LAB — CBC
Hematocrit: 39.7 % (ref 34.0–46.6)
Hemoglobin: 13.1 g/dL (ref 11.1–15.9)
MCH: 28.7 pg (ref 26.6–33.0)
MCHC: 33 g/dL (ref 31.5–35.7)
MCV: 87 fL (ref 79–97)
Platelets: 381 10*3/uL (ref 150–450)
RBC: 4.56 x10E6/uL (ref 3.77–5.28)
RDW: 13.9 % (ref 11.7–15.4)
WBC: 8 10*3/uL (ref 3.4–10.8)

## 2020-01-22 LAB — HEMOGLOBIN A1C
Est. average glucose Bld gHb Est-mCnc: 111 mg/dL
Hgb A1c MFr Bld: 5.5 % (ref 4.8–5.6)

## 2020-03-18 ENCOUNTER — Ambulatory Visit: Payer: Self-pay | Attending: Nurse Practitioner

## 2020-03-18 ENCOUNTER — Other Ambulatory Visit: Payer: Self-pay

## 2020-04-03 ENCOUNTER — Encounter (HOSPITAL_COMMUNITY): Payer: Self-pay | Admitting: Emergency Medicine

## 2020-04-03 ENCOUNTER — Other Ambulatory Visit: Payer: Self-pay

## 2020-04-03 ENCOUNTER — Emergency Department (HOSPITAL_COMMUNITY)
Admission: EM | Admit: 2020-04-03 | Discharge: 2020-04-03 | Disposition: A | Discharge status: Home / Self Care | Payer: Self-pay | Attending: Emergency Medicine | Admitting: Emergency Medicine

## 2020-04-03 DIAGNOSIS — R110 Nausea: Secondary | ICD-10-CM

## 2020-04-03 DIAGNOSIS — N938 Other specified abnormal uterine and vaginal bleeding: Secondary | ICD-10-CM

## 2020-04-03 LAB — URINALYSIS, ROUTINE W REFLEX MICROSCOPIC
Bilirubin Urine: NEGATIVE
Glucose, UA: NEGATIVE mg/dL
Ketones, ur: NEGATIVE mg/dL
Nitrite: NEGATIVE
Protein, ur: 100 mg/dL — AB
RBC / HPF: >50 RBC/hpf — ABNORMAL HIGH (ref 0–5)
Specific Gravity, Urine: 1.017 (ref 1.005–1.030)
pH: 6 (ref 5.0–8.0)

## 2020-04-03 LAB — I-STAT BETA HCG BLOOD, ED (MC, WL, AP ONLY): I-stat hCG, quantitative: <5 m[IU]/mL (ref <5)

## 2020-04-03 LAB — CBC WITH DIFFERENTIAL/PLATELET
Abs Immature Granulocytes: 0.02 10*3/uL (ref 0.00–0.07)
Basophils Absolute: 0.1 10*3/uL (ref 0.0–0.1)
Basophils Relative: 1 %
Eosinophils Absolute: 0.3 10*3/uL (ref 0.0–0.5)
Eosinophils Relative: 4 %
HCT: 40.2 % (ref 36.0–46.0)
Hemoglobin: 13.1 g/dL (ref 12.0–15.0)
Immature Granulocytes: 0 %
Lymphocytes Relative: 36 %
Lymphs Abs: 2.5 10*3/uL (ref 0.7–4.0)
MCH: 28.5 pg (ref 26.0–34.0)
MCHC: 32.6 g/dL (ref 30.0–36.0)
MCV: 87.6 fL (ref 80.0–100.0)
Monocytes Absolute: 0.5 10*3/uL (ref 0.1–1.0)
Monocytes Relative: 7 %
Neutro Abs: 3.5 10*3/uL (ref 1.7–7.7)
Neutrophils Relative %: 52 %
Platelets: 365 10*3/uL (ref 150–400)
RBC: 4.59 MIL/uL (ref 3.87–5.11)
RDW: 13.6 % (ref 11.5–15.5)
WBC: 6.8 10*3/uL (ref 4.0–10.5)
nRBC: 0 % (ref 0.0–0.2)

## 2020-04-03 LAB — COMPREHENSIVE METABOLIC PANEL
ALT: 31 U/L (ref 0–44)
AST: 26 U/L (ref 15–41)
Albumin: 4 g/dL (ref 3.5–5.0)
Alkaline Phosphatase: 74 U/L (ref 38–126)
Anion gap: 8 (ref 5–15)
BUN: 14 mg/dL (ref 6–20)
CO2: 25 mmol/L (ref 22–32)
Calcium: 8.9 mg/dL (ref 8.9–10.3)
Chloride: 106 mmol/L (ref 98–111)
Creatinine, Ser: 0.65 mg/dL (ref 0.44–1.00)
GFR calc Af Amer: >60 mL/min (ref >60)
GFR calc non Af Amer: >60 mL/min (ref >60)
Glucose, Bld: 110 mg/dL — ABNORMAL HIGH (ref 70–99)
Potassium: 3.6 mmol/L (ref 3.5–5.1)
Sodium: 139 mmol/L (ref 135–145)
Total Bilirubin: 0.7 mg/dL (ref 0.3–1.2)
Total Protein: 7.3 g/dL (ref 6.5–8.1)

## 2020-04-03 LAB — WET PREP, GENITAL
Clue Cells Wet Prep HPF POC: NONE SEEN
Sperm: NONE SEEN
Trich, Wet Prep: NONE SEEN
Yeast Wet Prep HPF POC: NONE SEEN

## 2020-04-03 LAB — TYPE AND SCREEN
ABO/RH(D): O POS
Antibody Screen: NEGATIVE

## 2020-04-03 LAB — HIV ANTIBODY (ROUTINE TESTING W REFLEX): HIV Screen 4th Generation wRfx: NONREACTIVE

## 2020-04-03 MED ORDER — ONDANSETRON HCL 4 MG/2ML IJ SOLN
4.0000 mg | Freq: Once | INTRAMUSCULAR | Status: AC
  Administered 2020-04-03: 4 mg via INTRAVENOUS
  Filled 2020-04-03: qty 2

## 2020-04-03 MED ORDER — ONDANSETRON 4 MG PO TBDP
4.0000 mg | ORAL_TABLET | Freq: Three times a day (TID) | ORAL | 0 refills | Status: DC | PRN
Start: 2020-04-03 — End: 2020-12-23

## 2020-04-03 MED ORDER — TRANEXAMIC ACID 650 MG PO TABS: 1300.0000 mg | ORAL_TABLET | Freq: Three times a day (TID) | ORAL | 0 refills | Status: AC

## 2020-04-03 MED ORDER — SODIUM CHLORIDE 0.9 % IV BOLUS
1000.0000 mL | Freq: Once | INTRAVENOUS | Status: AC
  Administered 2020-04-03: 1000 mL via INTRAVENOUS

## 2020-04-03 NOTE — ED Triage Notes (Signed)
Pt st's she has had vag.bleeding x's 4 weeks.  St's last night she had to wear a pamper due to the bleeding.  Pt also st's she has felt dizzy since she got up this am with nausea and vomiting

## 2020-04-03 NOTE — Discharge Instructions (Signed)
Take the medication as prescribed. Return for new or worsening symptoms.  If you develop any chest pain, shortness of breath, swelling to one of your legs you need to seek reevaluation stop taking this medication.  I have placed a referral for OB/GYN.  You need to follow-up with them outpatient

## 2020-04-03 NOTE — ED Provider Notes (Signed)
[facility] EMERGENCY DEPARTMENT Provider Note   CSN: [id number] Arrival date & time: 04/03/20  1613     History Chief Complaint  Patient presents with  . Vaginal Bleeding    Jennifer Holmes is a 34 y.o. female with medical history significant for headache, GERD who presents for evaluation of vaginal bleeding.  Patient states she has had her menstrual cycle times 4 weeks.  Initially started out as dark red blood however is now bright red blood.  She has clots the size of her little fingernail.  She is changing her pad approximately every 2 hours.  She is not followed by OB/GYN.  States she did have episode of dizziness when she went from sitting to standing earlier today.  Has needed a blood transfusion for her prior postpartum hemorrhage however otherwise denies any history of anemia.  She denies any headache, blurred vision, chest pain, shortness of breath, abdominal pain, pelvic pain, dysuria, unilateral weakness.  Denies additional aggravating or alleviating factors.  No OCP's.  No history of PE, DVT or clotting disorder in herself or family  History obtained from patient past medical records.  No interpreter is used.  History of dysfunctional uterine bleeding however not followed by OB/GYN.  HPI     Past Medical History:  Diagnosis Date  . Gastroesophageal Reflux Disease (GERD)   . Headache   . HELLP syndrome   . History of blood transfusion for PP hemorrhage   . Postoperative Nausea and Vomiting (PONV)   . Preterm labor     Patient Active Problem List   Diagnosis Date Noted  . Hepatic steatosis 08/23/2012    Past Surgical History:  Procedure Laterality Date  . CESAREAN SECTION N/A 03/01/2008     OB History    Gravida  3   Para  3   Term  1   Preterm  2   AB  0   Living  3     SAB  0   TAB  0   Ectopic  0   Multiple  0   Live Births  3        Obstetric Comments  Had a blood transfusion with first pregnancy after  her C/S in [place].        Family History  Problem Relation Age of Onset  . Hypertension Mother   . Heart disease Mother   . Hypertension Brother     Social History   Tobacco Use  . Smoking status: Never Smoker  . Smokeless tobacco: Never Used  Substance Use Topics  . Alcohol use: No  . Drug use: No    Home Medications Prior to Admission medications   Medication Sig Start Date End Date Taking? Authorizing Provider  acetaminophen (TYLENOL) 325 MG tablet Take 650 mg by mouth as needed for moderate pain.    Yes [provider]  ondansetron (ZOFRAN ODT) 4 MG disintegrating tablet Take 1 tablet (4 mg total) by mouth every 8 (eight) hours as needed for nausea or vomiting. 04/03/20   Tyree Fluharty A, PA-C  tranexamic acid (LYSTEDA) 650 MG TABS tablet Take 2 tablets (1,300 mg total) by mouth 3 (three) times daily for 5 days. 04/03/20 04/08/20  Zyanya Glaza A, PA-C    Allergies    Lactose intolerance (gi)  Review of Systems   Review of Systems  Constitutional: Negative.   HENT: Negative.   Eyes: Negative.   Respiratory: Negative.   Cardiovascular: Negative.   Gastrointestinal: Negative.  Genitourinary: Positive for menstrual problem. Negative for decreased urine volume, difficulty urinating, dysuria, flank pain, frequency, genital sores, hematuria, pelvic pain, vaginal bleeding, vaginal discharge and vaginal pain.  Musculoskeletal: Negative.   Skin: Negative.   Neurological: Negative.   All other systems reviewed and are negative.   Physical Exam Updated Vital Signs BP (!) 169/107 (BP Location: Right Arm)   Pulse 99   Temp 98.4 F (36.9 C) (Oral)   Resp 16   Ht 5\' 2"  (1.575 m)   Wt 89.4 kg   LMP 04/03/2020 (Exact Date)   SpO2 99%   BMI 36.03 kg/m   Physical Exam Vitals and nursing note reviewed. Exam conducted with a chaperone present.  Constitutional:      General: She is not in acute distress.    Appearance: She is well-developed. She is not  ill-appearing, toxic-appearing or diaphoretic.  HENT:     Head: Normocephalic and atraumatic.     Nose: Nose normal.     Mouth/Throat:     Mouth: Mucous membranes are moist.     Pharynx: Oropharynx is clear.  Eyes:     Pupils: Pupils are equal, round, and reactive to light.     Comments: EOMs intact.  No nystagmus.  Cardiovascular:     Rate and Rhythm: Normal rate.     Pulses: Normal pulses.     Heart sounds: Normal heart sounds.  Pulmonary:     Effort: Pulmonary effort is normal. No respiratory distress.     Breath sounds: Normal breath sounds.  Abdominal:     General: Bowel sounds are normal. There is no distension.     Tenderness: There is no abdominal tenderness. There is no right CVA tenderness, left CVA tenderness or guarding.  Genitourinary:    Comments: Normal appearing external female genitalia without rashes or lesions, normal vaginal epithelium. Normal appearing cervix without discharge or petechiae. There is moderate bleeding noted at the os. No Odor. Bimanual: No CMT, nontender.  No palpable adnexal masses or tenderness. Uterus midline and not fixed. Rectovaginal exam was deferred.  No cystocele or rectocele noted. No pelvic lymphadenopathy noted. Wet prep was obtained.  Cultures for gonorrhea and chlamydia collected. Exam performed with chaperone in room. Musculoskeletal:        General: Normal range of motion.     Cervical back: Normal range of motion.  Skin:    General: Skin is warm and dry.     Capillary Refill: Capillary refill takes less than 2 seconds.  Neurological:     General: No focal deficit present.     Mental Status: She is alert and oriented to person, place, and time.     Comments: Cn 2-12 grossly intact Gait without ataxia    ED Results / Procedures / Treatments   Labs (all labs ordered are listed, but only abnormal results are displayed) Labs Reviewed  WET PREP, GENITAL - Abnormal; Notable for the following components:      Result Value   WBC,  Wet Prep HPF POC MODERATE (*)    All other components within normal limits  COMPREHENSIVE METABOLIC PANEL - Abnormal; Notable for the following components:   Glucose, Bld 110 (*)    All other components within normal limits  URINALYSIS, ROUTINE W REFLEX MICROSCOPIC - Abnormal; Notable for the following components:   Color, Urine PINK (*)    APPearance HAZY (*)    Hgb urine dipstick LARGE (*)    Protein, ur 100 (*)    Leukocytes,Ua SMALL (*)  RBC / HPF >50 (*)    Bacteria, UA FEW (*)    All other components within normal limits  CBC WITH DIFFERENTIAL/PLATELET  RPR  HIV ANTIBODY (ROUTINE TESTING W REFLEX)  I-STAT BETA HCG BLOOD, ED ([facility], [facility], [facility] ONLY)  TYPE AND SCREEN  ABO/RH  GC/CHLAMYDIA PROBE AMP ([place]) NOT AT [facility]    EKG None  Radiology No results found.  Procedures Procedures (including critical care time)  Medications Ordered in ED Medications  sodium chloride 0.9 % bolus 1,000 mL (1,000 mLs Intravenous Bolus from Bag 04/03/20 2150)  ondansetron (ZOFRAN) injection 4 mg (4 mg Intravenous Given 04/03/20 2155)    ED Course  I have reviewed the triage vital signs and the nursing notes.  Pertinent labs & imaging results that were available during my care of the patient were reviewed by me and considered in my medical decision making (see chart for details).  34 year old female presents for evaluation of vaginal bleeding x4 weeks.  She is afebrile, nonseptic, not ill-appearing.  Changing her pad every 2 hours.  Admits to episode of dizziness when she went from sitting to standing earlier today.  Denies prior history of anemia however states she has needed blood transfusion after a postpartum hemorrhage.  Has history of dysfunctional uterine bleeding.  Currently followed by OB/GYN.  Patient with nonfocal neuro exam.  She appears otherwise well.  Her abdomen is soft, nontender.  Labs are obtained from triage which I personally reviewed.  Pain on pelvic exam, orthostatic  vital signs, fluids and reassess  Labs interpreted: CBC without leukocytosis, hemoglobin 13.1 Metabolic panel with hyperglycemia to 110 however no additional electrolyte, renal or liver abnormality EKG without ST/T changes.  Patient reassessed.  GU exam with blood at cervical os however no large amount of continuous bleeding.  No clots.  No CMT or adnexal tenderness.  Orthostatic VS negative. Personally performed.  Low suspicion for acute neurologic, cardiac or pulmonary etiology of her lightheadedness.  I did have a thorough discussion with patient of careful, watchful waiting versus medication management for dysfunctional uterine bleeding.  Discussed risk of oral OCPs, Lysteda.  She denies any prior history of PE, DVT, family or personal history of clotting disorders.  I discussed risk versus benefit of these medications not limited to allergic reaction, VTE, cardiac/pulmonary disorders.  Patient voiced understanding and would like to start on medication management.  Start on [place].  I discussed strict return precautions with patient.  I have placed referral for OB/GYN outpatient follow-up.  The patient has been appropriately medically screened and/or stabilized in the ED. I have low suspicion for any other emergent medical condition which would require further screening, evaluation or treatment in the ED or require inpatient management.  Patient is hemodynamically stable and in no acute distress.  Patient able to ambulate in department prior to ED.  Evaluation does not show acute pathology that would require ongoing or additional emergent interventions while in the emergency department or further inpatient treatment.  I have discussed the diagnosis with the patient and answered all questions.  Pain is been managed while in the emergency department and patient has no further complaints prior to discharge.  Patient is comfortable with plan discussed in room and is stable for discharge at this time.   I have discussed strict return precautions for returning to the emergency department.  Patient was encouraged to follow-up with PCP/specialist refer to at discharge.   MDM Rules/Calculators/A&P  Final Clinical Impression(s) / ED Diagnoses Final diagnoses:  Dysfunctional uterine bleeding  Nausea    Rx / DC Orders ED Discharge Orders         Ordered    Ambulatory referral to Obstetrics / Gynecology     04/03/20 2230    tranexamic acid (LYSTEDA) 650 MG TABS tablet  3 times daily     04/03/20 2231    ondansetron (ZOFRAN ODT) 4 MG disintegrating tablet  Every 8 hours PRN     04/03/20 2236           Bj Morlock A, PA-C 04/03/20 2245    Blane Ohara, MD 04/03/20 2357

## 2020-04-04 LAB — GC/CHLAMYDIA PROBE AMP (~~LOC~~) NOT AT ARMC
Chlamydia: NEGATIVE
Comment: NEGATIVE
Comment: NORMAL
Neisseria Gonorrhea: NEGATIVE

## 2020-04-04 LAB — RPR: RPR Ser Ql: NONREACTIVE

## 2020-04-04 LAB — ABO/RH: ABO/RH(D): O POS

## 2020-04-12 NOTE — Progress Notes (Signed)
Patient ID: Jennifer Holmes, female   DOB: [birth year], 34 y.o.   MRN: [id number] Virtual Visit via Telephone Note  I connected with Jennifer Holmes on 04/13/20 at  4:10 PM EDT by telephone and verified that I am speaking with the correct person using two identifiers.   I discussed the limitations, risks, security and privacy concerns of performing an evaluation and management service by telephone and the availability of in person appointments. I also discussed with the patient that there may be a patient responsible charge related to this service. The patient expressed understanding and agreed to proceed.  PATIENT visit by telephone virtually in the context of Covid-19 pandemic. Patient location:  home My Location:  [facility] office Persons on the call:  Me, the patient, and interpreter(Jennifer Holmes)   History of Present Illness: After ED visit 4/25.  She was prescribed OCP but did not get them.  The bleeding has stopped 2 days ago on it's own.  The bleeding lasted for 5 weeks.  hgb was stable at the ED.   Blood pressure was high in ED but she has not checked it outside of the office.    From ED note: Pertinent labs & imaging results that were available during my care of the patient were reviewed by me and considered in my medical decision making (see chart for details).  34 year old female presents for evaluation of vaginal bleeding x4 weeks.  She is afebrile, nonseptic, not ill-appearing.  Changing her pad every 2 hours.  Admits to episode of dizziness when she went from sitting to standing earlier today.  Denies prior history of anemia however states she has needed blood transfusion after a postpartum hemorrhage.  Has history of dysfunctional uterine bleeding.  Currently followed by OB/GYN.  Patient with nonfocal neuro exam.  She appears otherwise well.  Her abdomen is soft, nontender.  Labs are obtained from triage which I personally reviewed.  Pain on pelvic exam, orthostatic vital  signs, fluids and reassess  Labs interpreted: CBC without leukocytosis, hemoglobin 13.1 Metabolic panel with hyperglycemia to 110 however no additional electrolyte, renal or liver abnormality EKG without ST/T changes.  Patient reassessed.  GU exam with blood at cervical os however no large amount of continuous bleeding.  No clots.  No CMT or adnexal tenderness.  Orthostatic VS negative. Personally performed.  Low suspicion for acute neurologic, cardiac or pulmonary etiology of her lightheadedness.  I did have a thorough discussion with patient of careful, watchful waiting versus medication management for dysfunctional uterine bleeding.  Discussed risk of oral OCPs, Lysteda.  She denies any prior history of PE, DVT, family or personal history of clotting disorders.  I discussed risk versus benefit of these medications not limited to allergic reaction, VTE, cardiac/pulmonary disorders.  Patient voiced understanding and would like to start on medication management.  Start on [place].  I discussed strict return precautions with patient.  I have placed referral for OB/GYN outpatient follow-up.  The patient has been appropriately medically screened and/or stabilized in the ED. I have low suspicion for any other emergent medical condition which would require further screening, evaluation or treatment in the ED or require inpatient management.  Patient is hemodynamically stable and in no acute distress.  Patient able to ambulate in department prior to ED.  Evaluation does not show acute pathology that would require ongoing or additional emergent interventions while in the emergency department or further inpatient treatment.  I have discussed the diagnosis with the patient and answered all questions.  Pain is been managed while in the emergency department and patient has no further complaints prior to discharge.  Patient is comfortable with plan discussed in room and is stable for discharge at this time.   I have discussed strict return precautions for returning to the emergency department.  Patient was encouraged to follow-up with PCP/specialist refer to at discharge    Observations/Objective:  NAD.    A&Ox3   Assessment and Plan: 1. Abnormal bleeding in menstrual cycle Stopped on it's own w/o the OCP that were prescribed - Ambulatory referral to Gynecology  2. Hyperglycemia I have had a lengthy discussion and provided education about insulin resistance and the intake of too much sugar/refined carbohydrates.  I have advised the patient to work at a goal of eliminating sugary drinks, candy, desserts, sweets, refined sugars, processed foods, and white carbohydrates.  The patient expresses understanding. A1C=5.15 January 2020 visit   3. Elevated blood pressure reading Check BP OOO 3-5 times weekly and record and bring to next visit. We have discussed target BP range and blood pressure goal. I have advised patient to check BP regularly and to call us back or report to clinic if the numbers are consistently higher than 140/90. We discussed the importance of compliance with medical therapy and DASH diet recommended, consequences of uncontrolled hypertension discussed.   4. Encounter for examination following treatment at hospital improving   Follow Up Instructions: See PCP in 1 month   I discussed the assessment and treatment plan with the patient. The patient was provided an opportunity to ask questions and all were answered. The patient agreed with the plan and demonstrated an understanding of the instructions.   The patient was advised to call back or seek an in-person evaluation if the symptoms worsen or if the condition fails to improve as anticipated.  I provided 14 minutes of non-face-to-face time during this encounter.   Freeman Caldron, PA-C

## 2020-04-13 ENCOUNTER — Other Ambulatory Visit: Payer: Self-pay

## 2020-04-13 ENCOUNTER — Ambulatory Visit: Payer: Self-pay | Attending: Nurse Practitioner | Admitting: Physician Assistant

## 2020-04-13 DIAGNOSIS — R030 Elevated blood-pressure reading, without diagnosis of hypertension: Secondary | ICD-10-CM

## 2020-04-13 DIAGNOSIS — N926 Irregular menstruation, unspecified: Secondary | ICD-10-CM

## 2020-04-13 DIAGNOSIS — R739 Hyperglycemia, unspecified: Secondary | ICD-10-CM

## 2020-04-13 DIAGNOSIS — Z09 Encounter for follow-up examination after completed treatment for conditions other than malignant neoplasm: Secondary | ICD-10-CM

## 2020-04-13 DIAGNOSIS — N939 Abnormal uterine and vaginal bleeding, unspecified: Secondary | ICD-10-CM

## 2020-07-02 ENCOUNTER — Emergency Department (HOSPITAL_COMMUNITY)
Admission: EM | Admit: 2020-07-02 | Discharge: 2020-07-03 | Disposition: A | Discharge status: Home / Self Care | Payer: Self-pay | Attending: Emergency Medicine | Admitting: Emergency Medicine

## 2020-07-02 ENCOUNTER — Other Ambulatory Visit: Payer: Self-pay

## 2020-07-02 DIAGNOSIS — Z5321 Procedure and treatment not carried out due to patient leaving prior to being seen by health care provider: Secondary | ICD-10-CM | POA: Insufficient documentation

## 2020-07-02 DIAGNOSIS — N926 Irregular menstruation, unspecified: Secondary | ICD-10-CM | POA: Insufficient documentation

## 2020-07-02 DIAGNOSIS — R109 Unspecified abdominal pain: Secondary | ICD-10-CM | POA: Insufficient documentation

## 2020-07-02 LAB — I-STAT BETA HCG BLOOD, ED (MC, WL, AP ONLY): I-stat hCG, quantitative: <5 m[IU]/mL (ref <5)

## 2020-07-02 NOTE — ED Notes (Signed)
Pt stated she waited too long and didn't want to wait any longer

## 2020-07-02 NOTE — ED Triage Notes (Addendum)
Pt presents to ED POV. Pt c/o abd pain 8/10. Pt reports that she had positive preg test 3w ago. Pt reports that today she felt cramping  pain all over abd, went to sit on toilet and felt a gush of water and lots of blood came out around 1900. PT has had to change pad once in 2 hours. Unknown LMP d/t irregular periods. Not seen by OB yet. G3P3

## 2020-07-06 ENCOUNTER — Ambulatory Visit: Payer: Self-pay | Admitting: Nurse Practitioner

## 2020-07-07 ENCOUNTER — Telehealth: Payer: Self-pay

## 2020-07-07 ENCOUNTER — Ambulatory Visit
Admission: RE | Admit: 2020-07-07 | Discharge: 2020-07-07 | Disposition: A | Discharge status: Home / Self Care | Payer: Self-pay | Source: Ambulatory Visit | Attending: Physician Assistant | Admitting: Physician Assistant

## 2020-07-07 ENCOUNTER — Inpatient Hospital Stay (HOSPITAL_COMMUNITY)
Admission: AD | Admit: 2020-07-07 | Discharge: 2020-07-07 | Disposition: A | Discharge status: Home / Self Care | Payer: Self-pay | Attending: Obstetrics and Gynecology | Admitting: Obstetrics and Gynecology

## 2020-07-07 ENCOUNTER — Telehealth (INDEPENDENT_AMBULATORY_CARE_PROVIDER_SITE_OTHER): Payer: Self-pay | Admitting: Lactation Services

## 2020-07-07 ENCOUNTER — Other Ambulatory Visit: Payer: Self-pay

## 2020-07-07 ENCOUNTER — Ambulatory Visit: Payer: Self-pay | Admitting: Physician Assistant

## 2020-07-07 VITALS — BP 147/105 | HR 74 | Temp 98.7°F | Resp 18 | Ht 61.5 in | Wt 189.0 lb

## 2020-07-07 DIAGNOSIS — R5383 Other fatigue: Secondary | ICD-10-CM

## 2020-07-07 DIAGNOSIS — I10 Essential (primary) hypertension: Secondary | ICD-10-CM | POA: Insufficient documentation

## 2020-07-07 DIAGNOSIS — O208 Other hemorrhage in early pregnancy: Secondary | ICD-10-CM

## 2020-07-07 DIAGNOSIS — O039 Complete or unspecified spontaneous abortion without complication: Secondary | ICD-10-CM

## 2020-07-07 DIAGNOSIS — Z3202 Encounter for pregnancy test, result negative: Secondary | ICD-10-CM | POA: Insufficient documentation

## 2020-07-07 DIAGNOSIS — R030 Elevated blood-pressure reading, without diagnosis of hypertension: Secondary | ICD-10-CM

## 2020-07-07 DIAGNOSIS — R1013 Epigastric pain: Secondary | ICD-10-CM

## 2020-07-07 DIAGNOSIS — N939 Abnormal uterine and vaginal bleeding, unspecified: Secondary | ICD-10-CM | POA: Insufficient documentation

## 2020-07-07 DIAGNOSIS — R102 Pelvic and perineal pain: Secondary | ICD-10-CM | POA: Insufficient documentation

## 2020-07-07 LAB — POCT URINALYSIS DIPSTICK
Bilirubin, UA: NEGATIVE
Glucose, UA: NEGATIVE
Ketones, UA: NEGATIVE
Leukocytes, UA: NEGATIVE
Nitrite, UA: NEGATIVE
Protein, UA: NEGATIVE
Spec Grav, UA: >=1.03 — AB (ref 1.010–1.025)
Urobilinogen, UA: 0.2 E.U./dL
pH, UA: 5.5 (ref 5.0–8.0)

## 2020-07-07 MED ORDER — PANTOPRAZOLE SODIUM 40 MG PO TBEC: 40.0000 mg | DELAYED_RELEASE_TABLET | Freq: Every day | ORAL | 0 refills | Status: DC

## 2020-07-07 MED FILL — ?PANTOPRAZOLE SODI DR 40MGT: 40 | 30 days supply | Qty: 30 | Fill #0

## 2020-07-07 NOTE — Progress Notes (Signed)
Established Patient Office Visit  Subjective:  Patient ID: Jennifer Holmes, female    DOB: Jul 01, 1986  Age: 34 y.o. MRN: [id number]  CC:  Chief Complaint  Patient presents with  . Pelvic Pain  . Fatigue    HPI Jennifer Holmes reports that she believes that she may have had a miscarriage.  OB History    Gravida  3   Para  3   Term  1   Preterm  2   AB  0   Living  3     SAB  0   TAB  0   Ectopic  0   Multiple  0   Live Births  3        Obstetric Comments  Had a blood transfusion with first pregnancy after her C/S in [place].        Reports that she had not had a menses since the end of April, took 3 different pregnancy tests which were all positive.  Reports that on July 24, she felt severe cramping, went to use the bathroom and felt "something come out" , had lots of blood.  Reports that she went to the emergency department but the wait was long so she went back home.  Reports that she continues to have bilateral lower abdominal pain, states she had intermittent bleeding which stopped yesterday.  Reports that she has felt very tired since this occurred, has been using ibuprofen every 6 hours without much relief of the pain.  Reports that she has had some nausea, denies vomiting.  Reports history of GERD, is not taking medication for this, states she has been having epigastric burning prior to eating, states this has been going on for the last month.  Does endorse history of hypertension, previously took amlodipine, has not taken medication since her daughter was born 3 years ago was checking her blood pressure at home until her machine broke.  Reports previous readings approximately 145/90   Due to language barrier, an interpreter was present during the history-taking and subsequent discussion (and for part of the physical exam) with this patient.       Past Medical History:  Diagnosis Date  . Gastroesophageal Reflux Disease (GERD)    . Headache   . HELLP syndrome   . History of blood transfusion for PP hemorrhage   . Postoperative Nausea and Vomiting (PONV)   . Preterm labor     Past Surgical History:  Procedure Laterality Date  . CESAREAN SECTION N/A 03/01/2008    Family History  Problem Relation Age of Onset  . Hypertension Mother   . Heart disease Mother   . Hypertension Brother     Social History   Socioeconomic History  . Marital status: Married    Spouse name: Not on file  . Number of children: Not on file  . Years of education: Not on file  . Highest education level: Not on file  Occupational History  . Not on file  Tobacco Use  . Smoking status: Never Smoker  . Smokeless tobacco: Never Used  Substance and Sexual Activity  . Alcohol use: No  . Drug use: No  . Sexual activity: Yes    Birth control/protection: None  Other Topics Concern  . Not on file  Social History Narrative  . Not on file   Social Determinants of Health   Financial Resource Strain:   . Difficulty of Paying Living Expenses:   Food Insecurity:   . Worried  About Running Out of Food in the Last Year:   . Ran Out of Food in the Last Year:   Transportation Needs:   . Lack of Transportation (Medical):   Marland Kitchen. Lack of Transportation (Non-Medical):   Physical Activity:   . Days of Exercise per Week:   . Minutes of Exercise per Session:   Stress:   . Feeling of Stress :   Social Connections:   . Frequency of Communication with Friends and Family:   . Frequency of Social Gatherings with Friends and Family:   . Attends Religious Services:   . Active Member of Clubs or Organizations:   . Attends BankerClub or Organization Meetings:   Marland Kitchen. Marital Status:   Intimate Partner Violence:   . Fear of Current or Ex-Partner:   . Emotionally Abused:   Marland Kitchen. Physically Abused:   . Sexually Abused:     Outpatient Medications Prior to Visit  Medication Sig Dispense Refill  . acetaminophen (TYLENOL) 325 MG tablet Take 650 mg by mouth as  needed for moderate pain.     Marland Kitchen. ondansetron (ZOFRAN ODT) 4 MG disintegrating tablet Take 1 tablet (4 mg total) by mouth every 8 (eight) hours as needed for nausea or vomiting. 20 tablet 0   No facility-administered medications prior to visit.    Allergies  Allergen Reactions  . Lactose Intolerance (Gi) Other (See Comments)    Reaction:  GI upset    ROS Review of Systems  Constitutional: Positive for fatigue. Negative for chills and fever.  HENT: Negative.   Eyes: Negative.   Respiratory: Negative.   Cardiovascular: Negative.   Gastrointestinal: Positive for abdominal pain and nausea. Negative for diarrhea and vomiting.  Endocrine: Negative.   Genitourinary: Positive for pelvic pain and vaginal bleeding. Negative for dysuria.  Musculoskeletal: Negative.   Skin: Negative.   Allergic/Immunologic: Negative.   Neurological: Negative.   Hematological: Negative.   Psychiatric/Behavioral: Negative.       Objective:    Physical Exam Constitutional:      General: She is not in acute distress.    Appearance: Normal appearance. She is not ill-appearing.  HENT:     Head: Normocephalic and atraumatic.     Right Ear: External ear normal.     Left Ear: External ear normal.     Nose: Nose normal.     Mouth/Throat:     Mouth: Mucous membranes are moist.     Pharynx: Oropharynx is clear.  Eyes:     Extraocular Movements: Extraocular movements intact.     Conjunctiva/sclera: Conjunctivae normal.     Pupils: Pupils are equal, round, and reactive to light.  Cardiovascular:     Rate and Rhythm: Normal rate and regular rhythm.     Pulses: Normal pulses.     Heart sounds: Normal heart sounds.  Pulmonary:     Effort: Pulmonary effort is normal.     Breath sounds: Normal breath sounds.  Abdominal:     General: Abdomen is flat. Bowel sounds are normal.     Palpations: Abdomen is soft.     Tenderness: There is abdominal tenderness in the right lower quadrant, epigastric area, suprapubic  area and left lower quadrant. There is no right CVA tenderness or left CVA tenderness.  Musculoskeletal:        General: Normal range of motion.     Cervical back: Normal range of motion and neck supple.  Skin:    General: Skin is warm and dry.  Neurological:  General: No focal deficit present.     Mental Status: She is alert and oriented to person, place, and time.  Psychiatric:        Mood and Affect: Mood normal.        Behavior: Behavior normal.        Thought Content: Thought content normal.        Judgment: Judgment normal.     BP (!) 147/105 (BP Location: Left Arm, Patient Position: Sitting, Cuff Size: Large)   Pulse 74   Temp 98.7 F (37.1 C) (Oral)   Resp 18   Ht 5' 1.5" (1.562 m)   Wt 189 lb (85.7 kg)   SpO2 100%   BMI 35.13 kg/m  Wt Readings from Last 3 Encounters:  07/07/20 189 lb (85.7 kg)  04/03/20 197 lb (89.4 kg)  12/18/17 200 lb (90.7 kg)     Health Maintenance Due  Topic Date Due  . COVID-19 Vaccine (1) Never done  . PAP SMEAR-Modifier  07/15/2017    There are no preventive care reminders to display for this patient.  No results found for: TSH Lab Results  Component Value Date   WBC 6.8 04/03/2020   HGB 13.1 04/03/2020   HCT 40.2 04/03/2020   MCV 87.6 04/03/2020   PLT 365 04/03/2020   Lab Results  Component Value Date   NA 139 04/03/2020   K 3.6 04/03/2020   CO2 25 04/03/2020   GLUCOSE 110 (H) 04/03/2020   BUN 14 04/03/2020   CREATININE 0.65 04/03/2020   BILITOT 0.7 04/03/2020   ALKPHOS 74 04/03/2020   AST 26 04/03/2020   ALT 31 04/03/2020   PROT 7.3 04/03/2020   ALBUMIN 4.0 04/03/2020   CALCIUM 8.9 04/03/2020   ANIONGAP 8 04/03/2020   Lab Results  Component Value Date   CHOL 167 01/21/2020   Lab Results  Component Value Date   HDL 43 01/21/2020   Lab Results  Component Value Date   LDLCALC 91 01/21/2020   Lab Results  Component Value Date   TRIG 196 (H) 01/21/2020   Lab Results  Component Value Date   CHOLHDL  3.9 01/21/2020   Lab Results  Component Value Date   HGBA1C 5.5 01/21/2020      Assessment & Plan:   Problem List Items Addressed This Visit    None    Visit Diagnoses    Pelvic pain    -  Primary   Relevant Orders   Urinalysis Dipstick (Completed)   Miscarriage       Relevant Orders   US Pelvic Complete With Transvaginal   US OB Transvaginal   Abdominal pain, epigastric       Relevant Medications   pantoprazole (PROTONIX) 40 MG tablet   Other Relevant Orders   H Pylori, IGM, IGG, IGA AB   Fatigue, unspecified type       Relevant Orders   CBC with Differential/Platelet   Comp. Metabolic Panel (12)   TSH   Vitamin D, 25-hydroxy   Elevated blood pressure reading       Relevant Orders   TSH    1. Pelvic pain  - Urinalysis Dipstick  2. Miscarriage Patient has a follow-up appointment with GYN on August 20 for abnormal vaginal bleeding, we are working to get patient an earlier appointment.  Patient was given stat appointment for imaging at 2 PM today. - US Pelvic Complete With Transvaginal - US OB Transvaginal  3. Abdominal pain, epigastric Trial Protonix, patient education given  on GERD lifestyle modifications, avoid NSAIDs - H Pylori, IGM, IGG, IGA AB - pantoprazole (PROTONIX) 40 MG tablet; Take 1 tablet (40 mg total) by mouth daily.  Dispense: 30 tablet; Refill: 0  4. Fatigue, unspecified type Patient to return to mobile unit in 2 weeks for reevaluation of fatigue. - CBC with Differential/Platelet - Comp. Metabolic Panel (12) - TSH - Vitamin D, 25-hydroxy  5. Elevated blood pressure reading On review of patient history, patient was seen as a new [facility] visit was virtual, patient declined restarting amlodipine at that time, stated that her blood pressure at home was within normal limits.  Patient encouraged to return to mobile unit in 2 weeks for reevaluation of blood pressure. -  TSH    I have reviewed the patient's medical history (PMH, PSH, Social History, Family History, Medications, and allergies) , and have been updated if relevant. I spent 30 minutes reviewing chart and  face to face time with patient.     Meds ordered this encounter  Medications  . pantoprazole (PROTONIX) 40 MG tablet    Sig: Take 1 tablet (40 mg total) by mouth daily.    Dispense:  30 tablet    Refill:  0    Order Specific Question:   Supervising Provider    Answer:   Storm Frisk [[id number]]    Follow-up: Return in about 2 weeks (around 07/21/2020).    Kasandra Knudsen Mayers, PA-C

## 2020-07-07 NOTE — Progress Notes (Signed)
Patient is aware of results and has spoken with OB/GYN. She will follow up with [facility] in 2 weeks and keep Gyn appt for 8/20

## 2020-07-07 NOTE — Patient Instructions (Addendum)
Please return to mobile unit in 2 weeks for follow up of blood pressure / fatigue  Please start taking Protonix once a day to help with epigastric pain, please avoid ibuprofen/aspirin/Motrin/Bayer, use Tylenol for pain  I hope that you feel better soon  Roney Jaffe, PA-C Physician Assistant [facility] [email or web address]    Aborto espontneo Miscarriage El aborto espontneo es la prdida de un beb que no ha nacido (feto) antes de la semana20 del [place]. La mayor parte de los abortos espontneos ocurre durante los primeros de Psychiatrist. A veces, un aborto ocurre antes de que la mujer sepa que est [place]. El aborto espontneo puede ser Neomia Dear experiencia que afecte emocionalmente a Dealer. Si ha sufrido un aborto espontneo, hable con su mdico y hgale las preguntas que tenga sobre el aborto espontneo, el proceso de duelo y los planes futuros de [place]ellen. Cules son las causas? Entre las causas de un aborto espontneo se incluyen las siguientes:  Problemas genticos o cromosmicos del feto. Estos problemas impiden que el beb se desarrolle con normalidad. En general, son el resultado de errores fortuitos que ocurren en la etapa temprana del desarrollo y que no se transmiten de padres a hijos (no se heredan).  Infeccin en el cuello del tero.  Trastornos que afectan el equilibrio hormonal del organismo.  Problemas en el cuello del tero, como su adelgazamiento y apertura antes de que el embarazo llegue a trmino (insuficiencia del cuello de tero).  Problemas en el tero. Estos pueden incluir, [employer], los siguientes: ? Forma anormal del tero. ? Fibromas en el tero. ? Anormalidades congnitas. Estos son problemas que ya estaban presentes en el nacimiento.  Ciertas enfermedades crnicas.  Fumar, beber alcohol o usar drogas.  Lesiones (traumatismos). En muchos de [employer], se desconoce la causa de  los abortos espontneos. Cules son los signos o los sntomas? Los sntomas de esta afeccin incluyen los siguientes:  Sangrado o manchado vaginal, con o sin clicos o dolor.  Dolor o clicos en el abdomen o en la parte inferior de la espalda.  Eliminacin de lquido, tejidos o cogulos sanguneos por la vagina. Cmo se diagnostica? Esta afeccin se puede diagnosticar en funcin de lo siguiente:  Examen fsico.  Ecografa.  Anlisis de [place].  Anlisis de [place]. Cmo se trata? En algunos casos, el tratamiento de un aborto espontneo no es necesario si se eliminan de forma natural todos los tejidos que se Loss adjuster, chartered. Si fuera necesario realizar un tratamiento por esta afeccin, este puede incluir lo siguiente:  Dilatacin y curetaje (D&C). Mediante este procedimiento, se expande el cuello del tero y se raspan las paredes (endometrio). Esto se realiza solamente si queda tejido del feto o la placenta dentro del cuerpo (aborto espontneo incompleto).  Medicamentos, por ejemplo: ? Antibiticos para tratar una infeccin. ? Medicamentos para ayudar al cuerpo a eliminar los restos de tejido. ? Medicamentos para reducir Theatre manager) el tamao del tero. Estos medicamentos se pueden administrar si tiene un sangrado abundante. Si su factor sanguneo es Rhnegativo y Madeline de su beb es Rhpositivo, usted Equities trader inyeccin del medicamento llamado inmunoglobulinaRhpara proteger a los bebs futuros de Warehouse manager problemas con el factorsanguneoRh. Los trminos "Rhnegativo" y "Rhpositivo" hacen referencia a la presencia o no en la sangre de una protena especfica que se encuentra en la superficie de los glbulos rojos (factor Rh). Siga estas indicaciones en su casa: Medicamentos   [employer] de venta libre y los recetados solamente  como se lo haya indicado el mdico.  Si le recetaron antibiticos, tmelos como se lo haya indicado el mdico. No deje de tomar los  antibiticos aunque comience a Actor.  No tome antiinflamatorios no esteroideos (AINE), tales como aspirina e ibuprofeno, a menos que se lo indique el mdico. Estos medicamentos pueden provocarle sangrado. Actividad  Haga reposo segn lo indicado. Pregntele al mdico qu actividades son seguras para usted.  Pdale a alguien que la ayude con las responsabilidades familiares y del hogar durante este tiempo. Instrucciones generales  Lleve un registro de la cantidad y la saturacin de las toallas higinicas que Landscape architect. Anote esta informacin.  Anote la cantidad de tejido o cogulos sanguneos que expulsa por la vagina. Guarde las cantidades grandes de tejidos para que el [employer] examine.  No use tampones, no se haga duchas vaginales ni tenga relaciones sexuales hasta que el mdico la autorice.  Para que usted y su pareja puedan sobrellevar el proceso del duelo, hable con su mdico o busque apoyo psicolgico.  Cuando est lista, visite a su mdico para hablar sobre los pasos importantes que deber seguir en relacin con su salud. Tambin hable [employer] que deber tomar para tener un embarazo saludable en el futuro.  Concurra a todas las visitas de [address] se lo haya indicado el mdico. Esto es importante. Dnde encontrar ms informacin  Colegio Estadounidense de [place] y Insurance claims handler of Obstetricians and Gynecologists): www.acog.org  Departamento de Salud y [address] de los [address], [place] de Salud de Architectural technologist (U.S. Department of Health and [employer], Office on [employer] [address]): [email or web address] [employer] con un mdico si:  Tiene fiebre o siente escalofros.  Tiene una secrecin vaginal con mal olor.  El sangrado aumenta en vez de disminuir. Solicite ayuda de inmediato si:  Siente calambres intensos o dolor en la espalda o en el abdomen.  Elimina cogulos de sangre o tejido por la vagina del tamao  de una nuez o ms grandes.  Necesita ms de una toalla higinica de tamao regular por hora.  Se siente mareada o dbil.  Se desmaya.  Siente una tristeza que la invade o Archivist. Resumen  La mayor parte de los abortos espontneos ocurre durante los primeros de Psychiatrist. En algunos casos, el aborto espontneo ocurre antes de que la mujer sepa que est [place].  Siga las indicaciones del mdico para el cuidado Facilities manager. Concurra a todas las visitas de control.  Para que usted y su pareja puedan sobrellevar el proceso del duelo, hable con su mdico o busque apoyo psicolgico. Esta informacin no tiene Theme park manager el consejo del mdico. Asegrese de hacerle al mdico cualquier pregunta que tenga. Document Revised: 09/02/2017 Document Reviewed: 09/02/2017 Elsevier Patient Education  2020 [employer].

## 2020-07-07 NOTE — MAU Provider Note (Signed)
None     S Jennifer Holmes is a 34 y.o. [phone] non-pregnant female who presents to MAU today with complaint of pelvic pain and irregular bleeding x 4 months with positive pregnancy test at home followed by hcg of <5 in the emergency department. She also reports HTN but is not taking medications due to cost and fatigue causing her to fall asleep at work.  There is also occasional dizziness and headaches which she denies today.  There is no chest pain, no shortness of breath.    O BP (!) 164/101   Pulse 78   Resp 18   SpO2 99%  Physical Exam VS reviewed, nursing note reviewed,  Constitutional: well developed, well nourished, no distress HEENT: normocephalic CV: normal rate Pulm/chest wall: normal effort Abdomen: soft Neuro: alert and oriented x 3 Skin: warm, dry Psych: affect normal  A Non pregnant female Medical screening exam complete 1. Other fatigue   2. Negative pregnancy test   3. Pelvic pain in female   4. Abnormal uterine bleeding (AUB)   5. Essential hypertension    MDM:  Pt had positive home UPT but negative hcg on 07/02/20.  She then had [place] in PCP office today and was sent to MAU for further work up.  I explained that the hcg of less than 5 indicates that she has not had a pregnancy recently. Her other symptoms need further evaluation but cannot be completed in MAU.  Pt was given the opportunity to seek emergency or urgent care for her pelvic pain/fatigue but pt declined. She plans to f/u with her PCP.  Message sent to [facility] to establish gyn care to evaluate AUB.  Pt encouraged to eat iron rich foods and take iron supplement or women's multivitamin daily. Pt stable at time of discharge.  P Discharge from MAU in stable condition Patient given the option of transfer to [facility] for further evaluation or seek care in outpatient facility of choice F/U with PCP and [facility] Warning signs for worsening condition that would warrant emergency follow-up  discussed Patient may return to MAU as needed for pregnancy related complaints  Hurshel Party, CNM 07/07/2020 5:07 PM

## 2020-07-07 NOTE — Telephone Encounter (Signed)
Staff contacted physicians office on behalf the patient for an urgent appt with the patient's provider. Staff of the provider's offfice commuincated they would route a note to the nurse and contact the patient directly for an appointment.

## 2020-07-07 NOTE — MAU Note (Signed)
.   Jennifer Holmes is a 34 y.o.  here in MAU reporting: that she was sent to MAU for follow up.  Onset of complaint: ongoing follow up Pain score: 0 Vitals:   07/07/20 1613 07/07/20 1618  BP:  (!) 164/101  Pulse:  78  Resp:  18  SpO2: 99% 99%     FHT: Lab orders placed from triage:

## 2020-07-07 NOTE — Discharge Instructions (Signed)
Sangrado uterino anormal Abnormal Uterine Bleeding El sangrado uterino anormal sucede cuando tiene un sangrado anormal del tero. Esto incluye lo siguiente:  Prdidas de sangre o hemorragias entre los perodos menstruales.  Sangrado luego de mantener relaciones sexuales.  Sangrado ms abundante que lo normal.  Perodos que duran ms de lo normal.  Sangrado luego de la menopausia. El sangrado uterino anormal puede afectar a las mujeres que estn en diversas etapas de la vida, desde adolescentes, mujeres frtiles y mujeres embarazadas, hasta mujeres que han llegado a la menopausia. Las causas ms comunes de sangrado uterino anormal incluyen lo siguiente:  Embarazo.  Crecimiento de tejido (plipos).  Tumores no cancerosos (fibromas) en el tero.  Infeccin.  Cncer.  Desequilibrios hormonales. El mdico debe evaluar cualquier clase de sangrado anormal. Muchos casos son leves y simples de tratar, mientras que otros son ms graves. El tratamiento depender de la causa del sangrado. Siga estas indicaciones en su casa:  Controle su afeccin para ver si hay cambios.  No se haga duchas vaginales, no utilice tampones ni tenga relaciones sexuales si as se lo indic su mdico.  Cambie los apsitos con frecuencia.  Realcese los exmenes de rutina que incluyen exmenes plvicos y controles de cncer de cuello uterino.  Concurra a todas las visitas de control como se lo haya indicado el mdico. Esto es importante. Comunquese con un mdico si:  El sangrado dura ms de una semana.  Se siente mareada por momentos.  Siente nuseas o vomita. Solicite ayuda de inmediato si:  Se desmaya.  Sangrado abundante que implica cambiar el apsito cada hora.  Siente dolor abdominal.  Tiene fiebre.  Se siente dbil o presenta sudoracin.  Elimina cogulos de sangre grandes por la vagina. Resumen  El sangrado uterino anormal sucede cuando tiene un sangrado anormal del tero.  El mdico  debe evaluar cualquier clase de sangrado anormal. Muchos casos son leves y simples de tratar, mientras que otros son ms graves.  El tratamiento depender de la causa del sangrado. Esta informacin no tiene como fin reemplazar el consejo del mdico. Asegrese de hacerle al mdico cualquier pregunta que tenga. Document Revised: 06/17/2017 Document Reviewed: 06/17/2017 Elsevier Patient Education  2020 Elsevier Inc.   Fatiga Fatigue Si siente fatiga, tiene una sensacin de cansancio en todo momento y falta de energa o falta de motivacin. La fatiga puede dificultar el inicio o la realizacin de tareas debido al agotamiento. Por lo general, la fatiga ocasional o leve con frecuencia es una reaccin normal a la actividad o la vida. Sin embargo, la fatiga de larga duracin (crnica) o extrema puede ser sntoma de una enfermedad. Siga estas indicaciones en su casa: Instrucciones generales  Controle su fatiga para ver si hay cambios.  Acustese y levntese a la misma hora todos los das.  Evite la fatiga moderando su ritmo durante el da y durmiendo lo suficiente durante la noche.  Mantenga un peso saludable. Medicamentos  Tome los medicamentos de venta libre y los recetados solamente como se lo haya indicado el mdico.  Tome una multivitamina, si se lo indic el mdico.  No tome ningn complemento alimenticio o a base de hierbas a menos que lo haya autorizado el mdico. Actividad   Realice ejercicio con regularidad como se lo haya indicado el mdico.  Practique tcnicas que lo ayuden a relajarse, como yoga, tai chi, meditacin, terapia de masajes. Comida y bebida   Evite las comidas abundantes a la noche.  Consuma una dieta bien balanceada que incluya protenas   magras, cereales integrales, abundantes frutas, verduras y productos lcteos descremados.  Evite el consumo excesivo de cafena.  Evite el consumo de alcohol.  Beba suficiente lquido para Consulting civil engineer orina de color  amarillo plido. [place] situaciones que le provocan estrs. Trate de que su horario de [place] y personal estn equilibrados.  No consuma ningn producto que contenga nicotina o tabaco, como cigarrillos y Psychologist, sport and exercise. Si necesita ayuda para dejar de fumar, consulte al mdico.  No consuma drogas. Comunquese con un mdico si:  La fatiga no mejora.  Tiene fiebre.  Pierde peso o [place] de peso de forma repentina.  Tiene dolores de [place].  Tiene problemas para conciliar el sueo o para dormir en la noche.  Se siente enojado, culpable, ansioso o triste.  No puede defecar (estreimiento).  Tiene la piel seca.  Observa hinchazn en las piernas u otras partes del cuerpo. Solicite ayuda de inmediato si:  Se siente confundido.  Tiene visin borrosa.  Tiene mareos o se desmaya.  Tiene un dolor de cabeza intenso.  Siente dolor intenso en el abdomen, la espalda o el rea entre la cintura y las caderas (pelvis).  Tiene dolor de pecho, dificultad para respirar, o latidos cardacos irregulares o acelerados.  No puede orinar u [employer] de lo normal.  Presenta sangrado anormal, como sangrado del recto, la vagina, la nariz, los pulmones o los pezones.  Vomita sangre.  Tiene pensamientos acerca de lastimarse a usted mismo o a Producer, television/film/video. Si alguna vez siente que puede lastimarse o Market researcher a los dems, o tiene pensamientos de poner fin a su vida, busque ayuda de inmediato. Puede dirigirse al servicio de emergencias ms cercano o comunicarse con:  El servicio de Therapist, sports (911 en los [place]).  Una lnea de asistencia al suicida y Freight forwarder en crisis, como la [employer] de Prevencin del Suicidio (National Suicide Prevention Lifeline) al [phone]. Est disponible las 24 horas del da. Resumen  Si siente fatiga, tiene una sensacin de cansancio en todo momento y falta de energa o falta de motivacin.  La fatiga  puede dificultar el inicio o la realizacin de tareas debido al agotamiento.  La fatiga de larga duracin (crnica) o extrema puede ser sntoma de una enfermedad.  Realice ejercicio con regularidad como se lo haya indicado el mdico.  Cambie las situaciones que le provocan estrs. Trate de que su horario de [place] y personal estn equilibrados. Esta informacin no tiene Marine scientist el consejo del mdico. Asegrese de hacerle al mdico cualquier pregunta que tenga. Document Revised: 11/13/2017 Document Reviewed: 11/13/2017 Elsevier Patient Education  2020 [employer].

## 2020-07-07 NOTE — Telephone Encounter (Addendum)
Received message from Registrar that call was received from [facility] PA that patient seen by her today and that she needs to be seen in our office.   Called patient with assistance of [employer], Research officer, trade union.   Patient is pregnant with bleeding for several days and abdominal cramping.  Patient reports 3 positive pregnancy tests at home. HCG on 7/24 was <5 . Patient reports she went to [facility] last week and left because the wait was too long.   [place] was completed today, no gestation sac noted.   Advised patient that she can go to [facility] for evaluation or call the doctor back that ordered her [place] for follow up recommendations. Patient was not sure she wanted to go to [facility] for evaluation, advised her to call the doctor who ordered the [place] for follow up.

## 2020-07-09 LAB — CBC WITH DIFFERENTIAL/PLATELET
Basophils Absolute: 0.1 10*3/uL (ref 0.0–0.2)
Basos: 1 %
EOS (ABSOLUTE): 0.2 10*3/uL (ref 0.0–0.4)
Eos: 4 %
Hematocrit: 43.9 % (ref 34.0–46.6)
Hemoglobin: 13.7 g/dL (ref 11.1–15.9)
Immature Grans (Abs): 0 10*3/uL (ref 0.0–0.1)
Immature Granulocytes: 0 %
Lymphocytes Absolute: 2 10*3/uL (ref 0.7–3.1)
Lymphs: 33 %
MCH: 26.9 pg (ref 26.6–33.0)
MCHC: 31.2 g/dL — ABNORMAL LOW (ref 31.5–35.7)
MCV: 86 fL (ref 79–97)
Monocytes Absolute: 0.3 10*3/uL (ref 0.1–0.9)
Monocytes: 5 %
Neutrophils Absolute: 3.5 10*3/uL (ref 1.4–7.0)
Neutrophils: 57 %
Platelets: 382 10*3/uL (ref 150–450)
RBC: 5.09 x10E6/uL (ref 3.77–5.28)
RDW: 14.4 % (ref 11.7–15.4)
WBC: 6 10*3/uL (ref 3.4–10.8)

## 2020-07-09 LAB — COMP. METABOLIC PANEL (12)
AST: 17 IU/L (ref 0–40)
Albumin/Globulin Ratio: 1.6 (ref 1.2–2.2)
Albumin: 4.5 g/dL (ref 3.8–4.8)
Alkaline Phosphatase: 91 IU/L (ref 48–121)
BUN/Creatinine Ratio: 18 (ref 9–23)
BUN: 9 mg/dL (ref 6–20)
Bilirubin Total: 0.4 mg/dL (ref 0.0–1.2)
Calcium: 9.3 mg/dL (ref 8.7–10.2)
Chloride: 103 mmol/L (ref 96–106)
Creatinine, Ser: 0.5 mg/dL — ABNORMAL LOW (ref 0.57–1.00)
GFR calc Af Amer: 148 mL/min/{1.73_m2} (ref >59)
GFR calc non Af Amer: 128 mL/min/{1.73_m2} (ref >59)
Globulin, Total: 2.9 g/dL (ref 1.5–4.5)
Glucose: 120 mg/dL — ABNORMAL HIGH (ref 65–99)
Potassium: 4.1 mmol/L (ref 3.5–5.2)
Sodium: 138 mmol/L (ref 134–144)
Total Protein: 7.4 g/dL (ref 6.0–8.5)

## 2020-07-09 LAB — TSH: TSH: 1.89 u[IU]/mL (ref 0.450–4.500)

## 2020-07-09 LAB — H PYLORI, IGM, IGG, IGA AB
H pylori, IgM Abs: <9 units (ref 0.0–8.9)
H. pylori, IgA Abs: <9 units (ref 0.0–8.9)
H. pylori, IgG AbS: 7.79 Index Value — ABNORMAL HIGH (ref 0.00–0.79)

## 2020-07-09 LAB — VITAMIN D 25 HYDROXY (VIT D DEFICIENCY, FRACTURES): Vit D, 25-Hydroxy: 19.5 ng/mL — ABNORMAL LOW (ref 30.0–100.0)

## 2020-07-11 ENCOUNTER — Encounter: Payer: Self-pay | Admitting: Critical Care Medicine

## 2020-07-11 ENCOUNTER — Other Ambulatory Visit: Payer: Self-pay | Admitting: Critical Care Medicine

## 2020-07-11 DIAGNOSIS — E559 Vitamin D deficiency, unspecified: Secondary | ICD-10-CM | POA: Insufficient documentation

## 2020-07-11 MED ORDER — VITAMIN D (ERGOCALCIFEROL) 1.25 MG (50000 UNIT) PO CAPS
50000.0000 [IU] | ORAL_CAPSULE | ORAL | 5 refills | Status: DC
Start: 2020-07-11 — End: 2021-01-03

## 2020-07-11 MED FILL — ?ERGOCALCIFEROL 50000 UNITC: 1.25 MG | 28 days supply | Qty: 4 | Fill #0

## 2020-07-18 ENCOUNTER — Telehealth: Payer: Self-pay | Admitting: *Deleted

## 2020-07-18 NOTE — Telephone Encounter (Signed)
Medical Assistant used [employer] to contact patient.  Interpreter Name: Reggy Eye Interpreter #: [id number] Patient verified DOB Patient is aware of thyroid, kidney and liver being normal. Patient is aware of bacteria in the stomach being noted and needing to remain on protonix daily. Patient is aware of needing to pick up vitamin d from the pharmacy and to take it weekly.

## 2020-07-18 NOTE — Telephone Encounter (Signed)
-----   Message from Storm Frisk, MD sent at 07/11/2020  1:14 PM EDT ----- Jennifer Holmes: let pt know blood counts and kidney, liver are normal.  Thyroid is normal.  H pylori suggests prior inflammation in the stomach.  Stay on protonx for now The vit D level is low.  I sent Rx for Vit D to take weekly to our pharmacy

## 2020-07-29 ENCOUNTER — Ambulatory Visit: Payer: Self-pay | Admitting: Medical

## 2020-08-17 ENCOUNTER — Other Ambulatory Visit: Payer: Self-pay

## 2020-08-17 DIAGNOSIS — Z20822 Contact with and (suspected) exposure to covid-19: Secondary | ICD-10-CM

## 2020-08-19 LAB — NOVEL CORONAVIRUS, NAA: SARS-CoV-2, NAA: NOT DETECTED

## 2020-08-19 LAB — SARS-COV-2, NAA 2 DAY TAT

## 2020-09-17 ENCOUNTER — Encounter (HOSPITAL_COMMUNITY): Payer: Self-pay

## 2020-09-17 ENCOUNTER — Other Ambulatory Visit: Payer: Self-pay

## 2020-09-17 ENCOUNTER — Ambulatory Visit (HOSPITAL_COMMUNITY)
Admission: EM | Admit: 2020-09-17 | Discharge: 2020-09-17 | Disposition: A | Discharge status: Home / Self Care | Payer: Self-pay | Attending: Emergency Medicine | Admitting: Emergency Medicine

## 2020-09-17 DIAGNOSIS — R030 Elevated blood-pressure reading, without diagnosis of hypertension: Secondary | ICD-10-CM

## 2020-09-17 DIAGNOSIS — R0602 Shortness of breath: Secondary | ICD-10-CM

## 2020-09-17 DIAGNOSIS — R079 Chest pain, unspecified: Secondary | ICD-10-CM

## 2020-09-17 NOTE — ED Triage Notes (Signed)
Pt present elevated Blood pressure, symptoms started a few days ago along with the High BP, pt is experience headache, dizziness and body aches

## 2020-09-17 NOTE — ED Provider Notes (Signed)
[facility]    CSN: [id number] Arrival date & time: 09/17/20  1133      History   Chief Complaint Chief Complaint  Patient presents with  . Hypertension    HPI Jennifer Holmes is a 34 y.o. female.   Patient presents with elevated blood pressure, dizziness, and headache x 2-3 days.  She also reports chest pain and shortness of breath since yesterday.  She denies focal weakness, numbness, slurred speech, confusion, facial asymmetry, fever, chills, cough, or or other symptoms.  No treatments attempted at home, her history includes GERD, hepatic stenosis, vitamin D deficiency, headache, HELLP syndrome.  The history is provided by the patient and medical records.    Past Medical History:  Diagnosis Date  . Gastroesophageal Reflux Disease (GERD)   . Headache   . HELLP syndrome   . History of blood transfusion for PP hemorrhage   . Postoperative Nausea and Vomiting (PONV)   . Preterm labor     Patient Active Problem List   Diagnosis Date Noted  . Vitamin D deficiency 07/11/2020  . Hepatic steatosis 08/23/2012    Past Surgical History:  Procedure Laterality Date  . CESAREAN SECTION N/A 03/01/2008    OB History    Gravida  3   Para  3   Term  1   Preterm  2   AB  0   Living  3     SAB  0   TAB  0   Ectopic  0   Multiple  0   Live Births  3        Obstetric Comments  Had a blood transfusion with first pregnancy after her C/S in [place].         Home Medications    Prior to Admission medications   Medication Sig Start Date End Date Taking? Authorizing Provider  acetaminophen (TYLENOL) 325 MG tablet Take 650 mg by mouth as needed for moderate pain.     [provider]  ondansetron (ZOFRAN ODT) 4 MG disintegrating tablet Take 1 tablet (4 mg total) by mouth every 8 (eight) hours as needed for nausea or vomiting. 04/03/20   Henderly, Britni A, PA-C  pantoprazole (PROTONIX) 40 MG tablet Take 1 tablet (40 mg total) by mouth  daily. 07/07/20   Mayers, Cari S, PA-C  Vitamin D, Ergocalciferol, (DRISDOL) 1.25 MG (50000 UNIT) CAPS capsule Take 1 capsule (50,000 Units total) by mouth every 7 (seven) days. 07/11/20   Storm Frisk, MD    Family History Family History  Problem Relation Age of Onset  . Hypertension Mother   . Heart disease Mother   . Hypertension Brother     Social History Social History   Tobacco Use  . Smoking status: Never Smoker  . Smokeless tobacco: Never Used  Substance Use Topics  . Alcohol use: No  . Drug use: No     Allergies   Lactose intolerance (gi)   Review of Systems Review of Systems  Constitutional: Negative for chills and fever.  HENT: Negative for ear pain and sore throat.   Eyes: Negative for pain and visual disturbance.  Respiratory: Positive for shortness of breath. Negative for cough.   Cardiovascular: Positive for chest pain. Negative for palpitations.  Gastrointestinal: Negative for abdominal pain and vomiting.  Genitourinary: Negative for dysuria and hematuria.  Musculoskeletal: Negative for arthralgias and back pain.  Skin: Negative for color change and rash.  Neurological: Positive for dizziness and headaches. Negative for tremors,  seizures, syncope, facial asymmetry, speech difficulty, weakness, light-headedness and numbness.  All other systems reviewed and are negative.    Physical Exam Triage Vital Signs ED Triage Vitals  Enc Vitals Group     BP      Pulse      Resp      Temp      Temp src      SpO2      Weight      Height      Head Circumference      Peak Flow      Pain Score      Pain Loc      Pain Edu?      Excl. in GC?    No data found.  Updated Vital Signs BP (!) 172/102 (BP Location: Right Arm)   Pulse 88   Temp 98.1 F (36.7 C) (Oral)   Resp 18   LMP 08/27/2020   SpO2 100%   Visual Acuity Right Eye Distance:   Left Eye Distance:   Bilateral Distance:    Right Eye Near:   Left Eye Near:    Bilateral Near:      Physical Exam Vitals and nursing note reviewed.  Constitutional:      General: She is not in acute distress.    Appearance: She is well-developed. She is not ill-appearing.  HENT:     Head: Normocephalic and atraumatic.     Mouth/Throat:     Mouth: Mucous membranes are moist.  Eyes:     Conjunctiva/sclera: Conjunctivae normal.  Cardiovascular:     Rate and Rhythm: Normal rate and regular rhythm.     Heart sounds: Normal heart sounds. No murmur heard.   Pulmonary:     Effort: Pulmonary effort is normal. No respiratory distress.     Breath sounds: Normal breath sounds. No wheezing, rhonchi or rales.  Abdominal:     Palpations: Abdomen is soft.     Tenderness: There is no abdominal tenderness. There is no guarding or rebound.  Musculoskeletal:     Cervical back: Neck supple.     Right lower leg: No edema.     Left lower leg: No edema.  Skin:    General: Skin is warm and dry.     Findings: No rash.  Neurological:     General: No focal deficit present.     Mental Status: She is alert and oriented to person, place, and time.     Cranial Nerves: No cranial nerve deficit.     Sensory: No sensory deficit.     Motor: No weakness.     Coordination: Romberg sign negative. Coordination normal.     Gait: Gait normal.  Psychiatric:        Mood and Affect: Mood normal.        Behavior: Behavior normal.      UC Treatments / Results  Labs (all labs ordered are listed, but only abnormal results are displayed) Labs Reviewed - No data to display  EKG   Radiology No results found.  Procedures Procedures (including critical care time)  Medications Ordered in UC Medications - No data to display  Initial Impression / Assessment and Plan / UC Course  I have reviewed the triage vital signs and the nursing notes.  Pertinent labs & imaging results that were available during my care of the patient were reviewed by me and considered in my medical decision making (see chart for  details).   Chest pain, shortness  of breath, elevated blood pressure reading.  EKG shows sinus rhythm, rate 78, no ST elevation, compared to previous from April 2021.  Sending patient to the ED for evaluation of her symptoms.  She declines EMS and states she feels stable to drive herself.     Final Clinical Impressions(s) / UC Diagnoses   Final diagnoses:  Chest pain, unspecified type  Shortness of breath  Elevated blood pressure reading     Discharge Instructions     Go to the emergency department for evaluation of your chest pain and shortness of breath.  Your blood pressure is elevated today at 172/102.  Please have this rechecked by your primary care provider next week.         ED Prescriptions    None     PDMP not reviewed this encounter.   Mickie Bail, NP 09/17/20 [phone]

## 2020-09-17 NOTE — ED Notes (Signed)
Patient is being discharged from the Urgent Care and sent to the Emergency Department via POV. Per K. Arlana Pouch, patient is in need of higher level of care due to CP, HTN Patient is aware and verbalizes understanding of plan of care.  Vitals:   09/17/20 1303  BP: (!) 172/102  Pulse: 88  Resp: 18  Temp: 98.1 F (36.7 C)  SpO2: 100%

## 2020-09-17 NOTE — Discharge Instructions (Addendum)
Go to the emergency department for evaluation of your chest pain and shortness of breath.  Your blood pressure is elevated today at 172/102.  Please have this rechecked by your primary care provider next week.

## 2020-09-19 ENCOUNTER — Telehealth: Payer: Self-pay

## 2020-09-19 ENCOUNTER — Emergency Department (HOSPITAL_COMMUNITY): Payer: Self-pay

## 2020-09-19 ENCOUNTER — Emergency Department (HOSPITAL_COMMUNITY)
Admission: EM | Admit: 2020-09-19 | Discharge: 2020-09-19 | Disposition: A | Discharge status: Home / Self Care | Payer: Self-pay | Attending: Emergency Medicine | Admitting: Emergency Medicine

## 2020-09-19 ENCOUNTER — Other Ambulatory Visit: Payer: Self-pay | Admitting: Nurse Practitioner

## 2020-09-19 ENCOUNTER — Ambulatory Visit: Payer: Self-pay | Admitting: *Deleted

## 2020-09-19 ENCOUNTER — Other Ambulatory Visit: Payer: Self-pay

## 2020-09-19 DIAGNOSIS — Z79899 Other long term (current) drug therapy: Secondary | ICD-10-CM | POA: Insufficient documentation

## 2020-09-19 DIAGNOSIS — I10 Essential (primary) hypertension: Secondary | ICD-10-CM | POA: Insufficient documentation

## 2020-09-19 DIAGNOSIS — I159 Secondary hypertension, unspecified: Secondary | ICD-10-CM

## 2020-09-19 LAB — CBC WITH DIFFERENTIAL/PLATELET
Abs Immature Granulocytes: 0.03 10*3/uL (ref 0.00–0.07)
Basophils Absolute: 0.1 10*3/uL (ref 0.0–0.1)
Basophils Relative: 1 %
Eosinophils Absolute: 0.3 10*3/uL (ref 0.0–0.5)
Eosinophils Relative: 4 %
HCT: 39.1 % (ref 36.0–46.0)
Hemoglobin: 12.5 g/dL (ref 12.0–15.0)
Immature Granulocytes: 0 %
Lymphocytes Relative: 42 %
Lymphs Abs: 3.1 10*3/uL (ref 0.7–4.0)
MCH: 27.3 pg (ref 26.0–34.0)
MCHC: 32 g/dL (ref 30.0–36.0)
MCV: 85.4 fL (ref 80.0–100.0)
Monocytes Absolute: 0.5 10*3/uL (ref 0.1–1.0)
Monocytes Relative: 7 %
Neutro Abs: 3.5 10*3/uL (ref 1.7–7.7)
Neutrophils Relative %: 46 %
Platelets: 412 10*3/uL — ABNORMAL HIGH (ref 150–400)
RBC: 4.58 MIL/uL (ref 3.87–5.11)
RDW: 14.7 % (ref 11.5–15.5)
WBC: 7.6 10*3/uL (ref 4.0–10.5)
nRBC: 0 % (ref 0.0–0.2)

## 2020-09-19 LAB — COMPREHENSIVE METABOLIC PANEL
ALT: 29 U/L (ref 0–44)
AST: 26 U/L (ref 15–41)
Albumin: 4.3 g/dL (ref 3.5–5.0)
Alkaline Phosphatase: 75 U/L (ref 38–126)
Anion gap: 8 (ref 5–15)
BUN: 9 mg/dL (ref 6–20)
CO2: 24 mmol/L (ref 22–32)
Calcium: 8.4 mg/dL — ABNORMAL LOW (ref 8.9–10.3)
Chloride: 105 mmol/L (ref 98–111)
Creatinine, Ser: 0.37 mg/dL — ABNORMAL LOW (ref 0.44–1.00)
GFR, Estimated: >60 mL/min (ref >60)
Glucose, Bld: 90 mg/dL (ref 70–99)
Potassium: 3.6 mmol/L (ref 3.5–5.1)
Sodium: 137 mmol/L (ref 135–145)
Total Bilirubin: 0.5 mg/dL (ref 0.3–1.2)
Total Protein: 8 g/dL (ref 6.5–8.1)

## 2020-09-19 MED ORDER — AMLODIPINE BESYLATE 5 MG PO TABS
5.0000 mg | ORAL_TABLET | Freq: Once | ORAL | Status: AC
  Administered 2020-09-19: 5 mg via ORAL
  Filled 2020-09-19: qty 1

## 2020-09-19 MED ORDER — AMLODIPINE BESYLATE 10 MG PO TABS: 10.0000 mg | ORAL_TABLET | Freq: Every day | ORAL | 1 refills | Status: DC

## 2020-09-19 MED ORDER — AMLODIPINE BESYLATE 5 MG PO TABS: 5.0000 mg | ORAL_TABLET | Freq: Every day | ORAL | 1 refills | Status: DC

## 2020-09-19 NOTE — Telephone Encounter (Signed)
Called pt , currently seen in  the ED for hypertension. Advised pt and husband to call and schedule appt with PCP after discharge. Verbalized understanding    Copied from CRM [id number]. Topic: General - Other >> Sep 19, 2020  8:51 AM Tamela Oddi wrote: Reason for CRM: Patient called regarding her BP running high.  She went to the ED and Urgent care and was told to follow up with her PCP.  There were no appts. Available util 10/21.  Patient is being triaged by NT as well.  Please advise and call patient to see if she can be seen sooner.  CB# [phone]

## 2020-09-19 NOTE — ED Triage Notes (Signed)
Patient reports headache for 3 days. Pt stated her blood pressure is been high at home and not been taking BP meds for 3 years.

## 2020-09-19 NOTE — Telephone Encounter (Signed)
Needs to take amlodipine 10 mg. Have her see luke in 2 weeks after starting amlodipine. If her blood pressure worsens or she experiences severe headache, vomiting, Chest pain, shortness of breath or changes in vision. Needs to be seen urgently at ED or Urgent care. s

## 2020-09-19 NOTE — Telephone Encounter (Signed)
Patient called with complaints headache and increased BP; her readings this morning on 09/19/20 were 194/115, 184/123 (she is not sure of the times), and 199/116 at 09/19/20 at 0853; this reading was taken on her left; she has shortness of breath with ambulaltion; she was seen in the ED on 09/17/20; recommendations made per nurse triage protocol; the is seen by Bertram Denver, [facility]; will route to office for notifcation  Reason for Disposition  [1] Systolic BP  >= 160 OR Diastolic >= 100 AND [2] cardiac or neurologic symptoms (e.g., chest pain, difficulty breathing, unsteady gait, blurred vision)  Answer Assessment - Initial Assessment Questions 1. BLOOD PRESSURE: "What is the blood pressure?" "Did you take at least two measurements 5 minutes apart?"    199/116 on 09/19/20 at 0853, and SBP 199 on 09/19/20 at  2. ONSET: "When did you take your blood pressure?"  3. HOW: "How did you obtain the blood pressure?" (e.g., visiting nurse, automatic home BP monitor)     Home BP arm on lower left arm 4. HISTORY: "Do you have a history of high blood pressure?"      pt seen in ED 08/18/20 for BP 5. MEDICATIONS: "Are you taking any medications for blood pressure?" "Have you missed any doses recently?"   no 6. OTHER SYMPTOMS: "Do you have any symptoms?" (e.g., headache, chest pain, blurred vision, difficulty breathing, weakness)    Intermittent chest pain, shortness of breath with ambulation, hadache 7. PREGNANCY: "Is there any chance you are pregnant?" "When was your last menstrual period?" No LMP 08/30/20  Protocols used: BLOOD PRESSURE - HIGH-A-AH

## 2020-09-19 NOTE — Discharge Instructions (Signed)
See your regular primary care doctor or the primary care doctor of your choice, by using the resource guide; for blood pressure check in 1 week.  This is important to see if you need additional medication.  Also, stay on a low-sodium diet, to help control blood pressure.  See the attached information about that.  Start taking the blood pressure medicine, prescribed today, tomorrow.

## 2020-09-19 NOTE — ED Provider Notes (Signed)
[facility]-EMERGENCY DEPT Provider Note   CSN: [id number] Arrival date & time: 09/19/20  1208     History Chief Complaint  Patient presents with  . Hypertension    Jennifer Holmes is a 34 y.o. female.  HPI She complains of headache and dizziness, onset several weeks ago, intermittent, with occasional shortness of breath.  She denies blurred vision, chest pain, focal weakness or paresthesia.  She has been checking her blood pressure at home and it varies between 175 and 190/110.  She stopped taking amlodipine, 3 years ago by her own volition.  She tried to get in to see her doctor but they "do not have an appointment."  There are no other known modifying factors.    Past Medical History:  Diagnosis Date  . Gastroesophageal Reflux Disease (GERD)   . Headache   . HELLP syndrome   . History of blood transfusion for PP hemorrhage   . Postoperative Nausea and Vomiting (PONV)   . Preterm labor     Patient Active Problem List   Diagnosis Date Noted  . Vitamin D deficiency 07/11/2020  . Hepatic steatosis 08/23/2012    Past Surgical History:  Procedure Laterality Date  . CESAREAN SECTION N/A 03/01/2008     OB History    Gravida  3   Para  3   Term  1   Preterm  2   AB  0   Living  3     SAB  0   TAB  0   Ectopic  0   Multiple  0   Live Births  3        Obstetric Comments  Had a blood transfusion with first pregnancy after her C/S in [place].        Family History  Problem Relation Age of Onset  . Hypertension Mother   . Heart disease Mother   . Hypertension Brother     Social History   Tobacco Use  . Smoking status: Never Smoker  . Smokeless tobacco: Never Used  Substance Use Topics  . Alcohol use: No  . Drug use: No    Home Medications Prior to Admission medications   Medication Sig Start Date End Date Taking? Authorizing Provider  acetaminophen (TYLENOL) 325 MG tablet Take 650 mg by mouth as needed for  moderate pain.     [provider]  amLODipine (NORVASC) 5 MG tablet Take 1 tablet (5 mg total) by mouth daily. 09/19/20   Mancel Bale, MD  ondansetron (ZOFRAN ODT) 4 MG disintegrating tablet Take 1 tablet (4 mg total) by mouth every 8 (eight) hours as needed for nausea or vomiting. 04/03/20   Henderly, Britni A, PA-C  pantoprazole (PROTONIX) 40 MG tablet Take 1 tablet (40 mg total) by mouth daily. 07/07/20   Mayers, Cari S, PA-C  Vitamin D, Ergocalciferol, (DRISDOL) 1.25 MG (50000 UNIT) CAPS capsule Take 1 capsule (50,000 Units total) by mouth every 7 (seven) days. 07/11/20   Storm Frisk, MD    Allergies    Lactose intolerance (gi)  Review of Systems   Review of Systems  All other systems reviewed and are negative.   Physical Exam Updated Vital Signs BP (!) 144/101   Pulse 80   Temp 98.8 F (37.1 C) (Oral)   Resp (!) 21   Ht 5\' 1"  (1.549 m)   Wt 85.3 kg   LMP 08/27/2020   SpO2 100%   BMI 35.52 kg/m   Physical Exam  Vitals and nursing note reviewed.  Constitutional:      General: She is not in acute distress.    Appearance: She is well-developed. She is not ill-appearing.  HENT:     Head: Normocephalic and atraumatic.     Right Ear: External ear normal.     Left Ear: External ear normal.     Mouth/Throat:     Pharynx: No oropharyngeal exudate.  Eyes:     Conjunctiva/sclera: Conjunctivae normal.     Pupils: Pupils are equal, round, and reactive to light.  Neck:     Trachea: Phonation normal.  Cardiovascular:     Rate and Rhythm: Normal rate and regular rhythm.     Heart sounds: Normal heart sounds.  Pulmonary:     Effort: Pulmonary effort is normal.     Breath sounds: Normal breath sounds.  Abdominal:     General: There is no distension.     Palpations: Abdomen is soft.  Musculoskeletal:        General: Normal range of motion.     Cervical back: Normal range of motion and neck supple.  Skin:    General: Skin is warm and dry.     Coloration: Skin  is not jaundiced.  Neurological:     Mental Status: She is alert and oriented to person, place, and time.     Cranial Nerves: No cranial nerve deficit.     Sensory: No sensory deficit.     Motor: No abnormal muscle tone.     Coordination: Coordination normal.     Comments: No dysarthria, aphasia or nystagmus.  No ataxia.  Psychiatric:        Mood and Affect: Mood normal.        Behavior: Behavior normal.        Thought Content: Thought content normal.        Judgment: Judgment normal.     ED Results / Procedures / Treatments   Labs (all labs ordered are listed, but only abnormal results are displayed) Labs Reviewed  COMPREHENSIVE METABOLIC PANEL - Abnormal; Notable for the following components:      Result Value   Creatinine, Ser 0.37 (*)    Calcium 8.4 (*)    All other components within normal limits  CBC WITH DIFFERENTIAL/PLATELET - Abnormal; Notable for the following components:   Platelets 412 (*)    All other components within normal limits    EKG EKG Interpretation  Date/Time:  Monday September 19 2020 17:09:54 EDT Ventricular Rate:  67 PR Interval:    QRS Duration: 86 QT Interval:  406 QTC Calculation: 429 R Axis:   62 Text Interpretation: Sinus rhythm since last tracing no significant change Confirmed by Mancel Bale [id number]) on 09/19/2020 5:55:08 PM   Radiology DG Chest 2 View  Result Date: 09/19/2020 CLINICAL DATA:  Pain. EXAM: CHEST - 2 VIEW COMPARISON:  None. FINDINGS: Borderline enlargement the cardiac silhouette. Both lungs are clear. No pleural effusions or pneumothorax. The visualized skeletal structures are unremarkable. IMPRESSION: 1. No acute cardiopulmonary disease. 2. Borderline cardiomegaly. Electronically Signed   By: Feliberto Harts MD   On: 09/19/2020 17:19    Procedures Procedures (including critical care time)  Medications Ordered in ED Medications  amLODipine (NORVASC) tablet 5 mg (5 mg Oral Given 09/19/20 1850)    ED Course  I  have reviewed the triage vital signs and the nursing notes.  Pertinent labs & imaging results that were available during my care of the patient were reviewed  by me and considered in my medical decision making (see chart for details).    MDM Rules/Calculators/A&P                           Patient Vitals for the past 24 hrs:  BP Temp Temp src Pulse Resp SpO2 Height Weight  09/19/20 1850 (!) 144/101 -- -- -- -- -- -- --  09/19/20 1845 (!) 142/109 -- -- 80 (!) 21 100 % -- --  09/19/20 1800 (!) 150/94 -- -- 71 14 100 % -- --  09/19/20 1745 136/84 -- -- 67 14 100 % -- --  09/19/20 1601 (!) 205/108 98.8 F (37.1 C) Oral 89 16 100 % -- --  09/19/20 1238 (!) 172/108 98 F (36.7 C) Oral 80 -- 99 % -- --  09/19/20 1235 -- -- -- -- -- -- 5\' 1"  (1.549 m) 85.3 kg    7:10 PM Reevaluation with update and discussion. After initial assessment and treatment, an updated evaluation reveals no change in clinical status, findings discussed with the patient and all questions were answered.   Medical Decision Making:  This patient is presenting for evaluation of high blood pressure, which does require a range of treatment options, and is a complaint that involves a moderate risk of morbidity and mortality. The differential diagnoses include elevated blood pressure, hypertensive urgency, metabolic instability. I decided to review old records, and in summary middle-aged female who start taking her blood pressure medicine and now has signs symptoms of high blood pressure.  I did not require additional historical information from anyone.  Clinical Laboratory Tests Ordered, included CBC and Metabolic panel. Review indicates normal except platelets slightly low, creatinine low, calcium low.   Critical Interventions-clinical evaluation, laboratory testing, observation reassessment.  Amlodipine started in the emergency department. Counseling given for treatment of blood pressure by modification of  diet  After These Interventions, the Patient was reevaluated and was found improved with blood pressure spontaneously lower, 136/84 and 150/94.  Doubt hypertensive urgency, acute metabolic disorder or impending vascular collapse.  CRITICAL CARE-no Performed by: Mancel Bale  Nursing Notes Reviewed/ Care Coordinated Applicable Imaging Reviewed Interpretation of Laboratory Data incorporated into ED treatment  The patient appears reasonably screened and/or stabilized for discharge and I doubt any other medical condition or other [facility] requiring further screening, evaluation, or treatment in the ED at this time prior to discharge.  Plan: Home Medications-restart Norvasc; Home Treatments-low-salt diet; return here if the recommended treatment, does not improve the symptoms; Recommended follow up-PCP, as needed     Final Clinical Impression(s) / ED Diagnoses Final diagnoses:  Secondary hypertension    Rx / DC Orders ED Discharge Orders         Ordered    amLODipine (NORVASC) 5 MG tablet  Daily        09/19/20 1909           11/19/20, MD 09/19/20 1910

## 2020-09-23 NOTE — Telephone Encounter (Signed)
Spoke to patient. Pt. Stated she is taking Amlodipine 5mg , two tablet a day.  CMA scheduled her an appt w/ Pharmacist to have her BP recheck.  Pt. Is aware of time and date.

## 2020-10-03 NOTE — Progress Notes (Signed)
   S:     PCP: Bertram Denver, NP PMH: vitamin D deficiency, hepatic steatosis   Patient arrives in good spirits not requiring a spanish interpreter. Presents to the clinic for hypertension evaluation, counseling, and management.  Patient was referred on 09/19/20 by Primary Care Provider.  Recent ED admission on 09/17/20 and 09/19/20 for elevated blood pressure, dizziness, and headache with chest pain and shortness of breath. BP was 172/102 and home readings varied between 175 and 190/110. Reported not taking amlodipine. Patient was restarted on amlodipine 10 mg daily by PCP on 09/19/20.  Today, patient reports taking two of the 5 mg amlodipine tabs (10 mg total) in the evenings. Denies missing any doses in the past two weeks and no side effects. Reports checking BP everyday, however, did not bring BP log. Reports last BP was 130/89. Denies dizziness, headaches, LE swelling, and blurred vision.  Medication adherence reported.  Current BP Medications include: Amlodipine 10 mg (2 tablets of 5 mg) in the evenings  Dietary habits include: soups with chicken, vegetables, beef, beans. Fast food at work   Exercise habits include: none   Family / Social history: -Fhx: Mother - HTN and heart disease; Brother - HTN -Tobacco use: denies  ASCVD risk factors include: HTN  O:  Vitals:   10/05/20 1059  BP: 124/86  Pulse: 71    Home BP readings: 130/89  Last 3 Office BP readings: BP Readings from Last 3 Encounters:  10/05/20 124/86  09/19/20 (!) 141/101  09/17/20 (!) 172/102    BMET    Component Value Date/Time   NA 137 09/19/2020 1703   NA 138 07/07/2020 1215   K 3.6 09/19/2020 1703   CL 105 09/19/2020 1703   CO2 24 09/19/2020 1703   GLUCOSE 90 09/19/2020 1703   GLUCOSE 86 12/13/2014 1502   BUN 9 09/19/2020 1703   BUN 9 07/07/2020 1215   CREATININE 0.37 (L) 09/19/2020 1703   CREATININE 0.32 (L) 12/06/2014 1521   CALCIUM 8.4 (L) 09/19/2020 1703   GFRNONAA >60 09/19/2020 1703    GFRAA 148 07/07/2020 1215    Renal function: CrCl cannot be calculated (Unknown ideal weight.).  Clinical ASCVD: No  The ASCVD Risk score Denman George DC Jr., et al., 2013) failed to calculate for the following reasons:   The 2013 ASCVD risk score is only valid for ages 37 to 66  A/P: Hypertension - BP 124/86 mmHg is near goal on current medications. BP Goal = <130/80 mmHg. Medication adherence optimal. Discussed the importance of medication adherence and not skipping any doses. Encouraged patient to monitor blood pressure at home daily and bring log to next visit. Encouraged patient to aim for a diet full of vegetables, fruit and lean meats (chicken, [place], fish) and to limit salt intake. Encouraged patient to exercise at least 15-30 minutes every day with the goal of 150 minutes per week.  -Continued amlodipine 10 mg daily.  -Counseled on lifestyle modifications for blood pressure control including reduced dietary sodium, increased exercise, adequate sleep.  Results reviewed and written information provided. Total time in face-to-face counseling 20 minutes.   F/U Clinic Visit in 1 month with pharmacy.    Fabio Neighbors, PharmD PGY2 Ambulatory Care Resident [facility]  Pharmacy

## 2020-10-05 ENCOUNTER — Ambulatory Visit: Payer: Self-pay | Attending: Nurse Practitioner | Admitting: Pharmacist

## 2020-10-05 ENCOUNTER — Encounter: Payer: Self-pay | Admitting: Pharmacist

## 2020-10-05 ENCOUNTER — Other Ambulatory Visit: Payer: Self-pay

## 2020-10-05 VITALS — BP 124/86 | HR 71

## 2020-10-05 DIAGNOSIS — R030 Elevated blood-pressure reading, without diagnosis of hypertension: Secondary | ICD-10-CM | POA: Insufficient documentation

## 2020-10-25 ENCOUNTER — Emergency Department (HOSPITAL_COMMUNITY): Payer: Self-pay

## 2020-10-25 ENCOUNTER — Other Ambulatory Visit: Payer: Self-pay

## 2020-10-25 ENCOUNTER — Encounter (HOSPITAL_COMMUNITY): Payer: Self-pay | Admitting: Emergency Medicine

## 2020-10-25 ENCOUNTER — Emergency Department (HOSPITAL_COMMUNITY)
Admission: EM | Admit: 2020-10-25 | Discharge: 2020-10-25 | Disposition: A | Discharge status: Home / Self Care | Payer: Self-pay | Attending: Emergency Medicine | Admitting: Emergency Medicine

## 2020-10-25 DIAGNOSIS — S3992XA Unspecified injury of lower back, initial encounter: Secondary | ICD-10-CM | POA: Insufficient documentation

## 2020-10-25 DIAGNOSIS — W010XXA Fall on same level from slipping, tripping and stumbling without subsequent striking against object, initial encounter: Secondary | ICD-10-CM | POA: Insufficient documentation

## 2020-10-25 LAB — POC URINE PREG, ED: Preg Test, Ur: NEGATIVE

## 2020-10-25 MED ORDER — OXYCODONE-ACETAMINOPHEN 5-325 MG PO TABS
1.0000 | ORAL_TABLET | Freq: Once | ORAL | Status: AC
  Administered 2020-10-25: 1 via ORAL
  Filled 2020-10-25: qty 1

## 2020-10-25 MED ORDER — DOCUSATE SODIUM 250 MG PO CAPS: 250.0000 mg | ORAL_CAPSULE | Freq: Every day | ORAL | 0 refills | Status: DC

## 2020-10-25 MED ORDER — ONDANSETRON 8 MG PO TBDP
8.0000 mg | ORAL_TABLET | Freq: Once | ORAL | Status: AC
  Administered 2020-10-25: 8 mg via ORAL
  Filled 2020-10-25: qty 1

## 2020-10-25 NOTE — ED Triage Notes (Signed)
The patient reports falling this morning when taking out the trash. She reports slipping on ice and falling on her buttock. She now complains of pain that increases when she sits.

## 2020-10-25 NOTE — Discharge Instructions (Addendum)
SEEK IMMEDIATE MEDICAL ATTENTION IF: New numbness, tingling, weakness, or problem with the use of your arms or legs.  Severe back pain not relieved with medications.  Change in bowel or bladder control.  Increasing pain in any areas of the body (such as chest or abdominal pain).  Shortness of breath, dizziness or fainting.  Nausea (feeling sick to your stomach), vomiting, fever, or sweats.  

## 2020-10-25 NOTE — ED Provider Notes (Signed)
[facility]-EMERGENCY DEPT Provider Note   CSN: [id number] Arrival date & time: 10/25/20  1731     History Chief Complaint  Patient presents with  . Back Pain    Jennifer Holmes is a 34 y.o. female.  Pt presented to [facility] ED via private vehicle with a chief complaint of sacral pain due to a fall she suffered this morning.  Pt reported she was taking out her garbage this morning when she slipped and fell at ground level.  Pt reported landing onto her buttocks.  Pt denied hitting her head or any loss of consciousness.  Pt was able to ambulate into her house.  Pt reports increased pain when sitting and upon interview is found to be standing next to examination chair.  Pt rates her pain as a 9/10 on the pain scale.  Pt reports taking 600mg  Ibuprofen at 14:00 with minimal improvement of pain.  Pt denies any head, neck or thoracic back pain.  Pt denies and radiation of of pain, numbness or tingling, bowel or bladder incontinence or retention.    The history is provided by the patient.       Past Medical History:  Diagnosis Date  . Gastroesophageal Reflux Disease (GERD)   . Headache   . HELLP syndrome   . History of blood transfusion for PP hemorrhage   . Postoperative Nausea and Vomiting (PONV)   . Preterm labor     Patient Active Problem List   Diagnosis Date Noted  . Elevated blood pressure reading 10/05/2020  . Vitamin D deficiency 07/11/2020  . Hepatic steatosis 08/23/2012    Past Surgical History:  Procedure Laterality Date  . CESAREAN SECTION N/A 03/01/2008     OB History    Gravida  3   Para  3   Term  1   Preterm  2   AB  0   Living  3     SAB  0   TAB  0   Ectopic  0   Multiple  0   Live Births  3        Obstetric Comments  Had a blood transfusion with first pregnancy after her C/S in 03/03/2008.        Family History  Problem Relation Age of Onset  . Hypertension Mother   . Heart disease Mother   .  Hypertension Brother     Social History   Tobacco Use  . Smoking status: Never Smoker  . Smokeless tobacco: Never Used  Substance Use Topics  . Alcohol use: No  . Drug use: No    Home Medications Prior to Admission medications   Medication Sig Start Date End Date Taking? Authorizing Provider  acetaminophen (TYLENOL) 325 MG tablet Take 650 mg by mouth as needed for moderate pain.     [provider]  amLODipine (NORVASC) 10 MG tablet Take 1 tablet (10 mg total) by mouth daily. 09/19/20 10/19/20  13/10/21, NP  docusate sodium (COLACE) 250 MG capsule Take 1 capsule (250 mg total) by mouth daily. 10/25/20   10/27/20, PA-C  ondansetron (ZOFRAN ODT) 4 MG disintegrating tablet Take 1 tablet (4 mg total) by mouth every 8 (eight) hours as needed for nausea or vomiting. 04/03/20   Henderly, Britni A, PA-C  pantoprazole (PROTONIX) 40 MG tablet Take 1 tablet (40 mg total) by mouth daily. 07/07/20   Mayers, Cari S, PA-C  Vitamin D, Ergocalciferol, (DRISDOL) 1.25 MG (50000 UNIT)  CAPS capsule Take 1 capsule (50,000 Units total) by mouth every 7 (seven) days. 07/11/20   Storm Frisk, MD    Allergies    Lactose intolerance (gi)  Review of Systems   Review of Systems  Constitutional: Negative for chills, fever and unexpected weight change.  Musculoskeletal: Positive for back pain (sacral pain). Negative for gait problem, joint swelling, neck pain and neck stiffness.  Neurological: Negative for dizziness, seizures, syncope, weakness, light-headedness, numbness and headaches.    Physical Exam Updated Vital Signs BP 107/69   Pulse 64   Temp 98.2 F (36.8 C) (Oral)   Resp 18   SpO2 100%   Physical Exam Constitutional:      General: She is not in acute distress.    Appearance: She is not ill-appearing, toxic-appearing or diaphoretic.  HENT:     Head: Normocephalic.  Pulmonary:     Effort: Pulmonary effort is normal.  Musculoskeletal:     Cervical back: No  swelling, edema, deformity, erythema, signs of trauma, lacerations, rigidity, tenderness or bony tenderness. No pain with movement. Normal range of motion.     Thoracic back: No swelling, edema, deformity, signs of trauma, tenderness or bony tenderness. Normal range of motion.     Lumbar back: Swelling and tenderness (sacral region) present. No edema, deformity, signs of trauma or lacerations.  Skin:    General: Skin is warm and dry.  Neurological:     General: No focal deficit present.     Mental Status: She is alert and oriented to person, place, and time.     Motor: No weakness.     Gait: Gait normal.     ED Results / Procedures / Treatments   Labs (all labs ordered are listed, but only abnormal results are displayed) Labs Reviewed  POC URINE PREG, ED    EKG None  Radiology DG Lumbar Spine Complete  Result Date: 10/25/2020 CLINICAL DATA:  Fall, low back pain EXAM: LUMBAR SPINE - COMPLETE 4+ VIEW COMPARISON:  None. FINDINGS: There is no evidence of lumbar spine fracture. Alignment is normal. Intervertebral disc spaces are maintained. IMPRESSION: Negative. Electronically Signed   By: Charlett Nose M.D.   On: 10/25/2020 19:54   DG Sacrum/Coccyx  Result Date: 10/25/2020 CLINICAL DATA:  Fall, sacral pain EXAM: SACRUM AND COCCYX - 2+ VIEW COMPARISON:  None. FINDINGS: There is no evidence of fracture or other focal bone lesions. Hip joints and SI joints are symmetric and unremarkable. IMPRESSION: Negative. Electronically Signed   By: Charlett Nose M.D.   On: 10/25/2020 19:53    Procedures Procedures (including critical care time)  Medications Ordered in ED Medications  oxyCODONE-acetaminophen (PERCOCET/ROXICET) 5-325 MG per tablet 1 tablet (1 tablet Oral Given 10/25/20 1827)  ondansetron (ZOFRAN-ODT) disintegrating tablet 8 mg (8 mg Oral Given 10/25/20 1827)    ED Course  I have reviewed the triage vital signs and the nursing notes.  Pertinent labs & imaging results that were  available during my care of the patient were reviewed by me and considered in my medical decision making (see chart for details).    MDM Rules/Calculators/A&P                          Patient with sacral pain.  No neurological deficits and normal neuro exam.  Patient can walk but states is painful.  No loss of bowel or bladder control.  No concern for cauda equina.  No fever, night sweats, weight  loss, h/o cancer, IVDU.  RICE protocol and pain medicine indicated and discussed with patient.   POC urine pregnancy was ordered and found to be negative.  X-rays of lumbar spine and sacrum/coccyx were obtained and reviewed.  There was no evidence of lumbar spine, sacrum or coccyx fracture.    Discussed the results of x-rays with patient.  As well as pain management with over-the-counter analgesics and RICE protocol.  Patient was informed of return protocols.   Final Clinical Impression(s) / ED Diagnoses Final diagnoses:  Tailbone injury, initial encounter    Rx / DC Orders ED Discharge Orders         Ordered    docusate sodium (COLACE) 250 MG capsule  Daily        10/25/20 2027           Haskel Schroeder, PA-C 10/25/20 2030    Pollyann Savoy, MD 10/25/20 [phone]

## 2020-10-31 ENCOUNTER — Ambulatory Visit: Payer: Self-pay | Admitting: Pharmacist

## 2020-12-10 NOTE — L&D Delivery Note (Addendum)
OB/GYN Faculty Practice Delivery Note  Jennifer Holmes is a 35 y.o. L3T3428 s/p SVD at [redacted]w[redacted]d. She was admitted for IOL-cHTN w/ SIPE w/ SF   ROM: 0h 34m with clear fluid GBS Status: Negative Maximum Maternal Temperature: 98.0  Labor Progress: Patient started on Pitocin and had AROM performed.  On Labatalol for blood pressure control.  Delivery Date/Time: 07/21/21 at 2116 Delivery: Called to room and patient was complete and head delivered delivered by nurse.  No nuchal cord present. Shoulder and body delivered in usual fashion. Initially baby with weak tone and poor respiration. Cord clamped by Dr Ephriam Jenkins. Tone and breathing improved with stimulation, without transfer to warmer or supplemental O2.  Cord blood drawn. Placenta delivered spontaneously with gentle cord traction. Fundus firm with massage and Pitocin. Labia, perineum, vagina, and cervix were inspected.   Placenta: Marginal insertion, intact, 3 vessel cord, sent to pathology Complications:  Lacerations: 1st degree perineal, repaired with suture EBL: 235 mL, given TXA and dose of Hemobate x1 for trickling Analgesia: Topical Lidocaine  Postpartum Planning  GHTN w/ superimposed Pre-E w/ SF Given IV Lasix 20.  Continue Magnesium and Procardia ordered for Mg.   [ ]  Transfer to [facility] [ ]  AM CBC [ ]  message to sent to schedule follow-up  [ ]  vaccines UTD  Infant: Healthy baby boy  APGARs 6,9  [id number]  , MD

## 2020-12-16 ENCOUNTER — Other Ambulatory Visit: Payer: Self-pay

## 2020-12-23 ENCOUNTER — Other Ambulatory Visit (HOSPITAL_COMMUNITY): Payer: Self-pay | Admitting: Student

## 2020-12-23 ENCOUNTER — Inpatient Hospital Stay (HOSPITAL_COMMUNITY)
Admission: AD | Admit: 2020-12-23 | Discharge: 2020-12-23 | Disposition: A | Discharge status: Home / Self Care | Payer: Self-pay | Attending: Obstetrics and Gynecology | Admitting: Obstetrics and Gynecology

## 2020-12-23 ENCOUNTER — Other Ambulatory Visit: Payer: Self-pay

## 2020-12-23 ENCOUNTER — Inpatient Hospital Stay (HOSPITAL_COMMUNITY): Payer: Self-pay

## 2020-12-23 ENCOUNTER — Encounter (HOSPITAL_COMMUNITY): Payer: Self-pay | Admitting: Obstetrics and Gynecology

## 2020-12-23 DIAGNOSIS — J069 Acute upper respiratory infection, unspecified: Secondary | ICD-10-CM | POA: Insufficient documentation

## 2020-12-23 DIAGNOSIS — Z3490 Encounter for supervision of normal pregnancy, unspecified, unspecified trimester: Secondary | ICD-10-CM

## 2020-12-23 DIAGNOSIS — O10911 Unspecified pre-existing hypertension complicating pregnancy, first trimester: Secondary | ICD-10-CM

## 2020-12-23 DIAGNOSIS — O10011 Pre-existing essential hypertension complicating pregnancy, first trimester: Secondary | ICD-10-CM | POA: Insufficient documentation

## 2020-12-23 DIAGNOSIS — R102 Pelvic and perineal pain: Secondary | ICD-10-CM | POA: Insufficient documentation

## 2020-12-23 DIAGNOSIS — R109 Unspecified abdominal pain: Secondary | ICD-10-CM

## 2020-12-23 DIAGNOSIS — O26891 Other specified pregnancy related conditions, first trimester: Secondary | ICD-10-CM | POA: Insufficient documentation

## 2020-12-23 DIAGNOSIS — O99511 Diseases of the respiratory system complicating pregnancy, first trimester: Secondary | ICD-10-CM | POA: Insufficient documentation

## 2020-12-23 DIAGNOSIS — Z20822 Contact with and (suspected) exposure to covid-19: Secondary | ICD-10-CM | POA: Insufficient documentation

## 2020-12-23 DIAGNOSIS — Z3A08 8 weeks gestation of pregnancy: Secondary | ICD-10-CM | POA: Insufficient documentation

## 2020-12-23 LAB — URINALYSIS, ROUTINE W REFLEX MICROSCOPIC
Bacteria, UA: NONE SEEN
Bilirubin Urine: NEGATIVE
Glucose, UA: NEGATIVE mg/dL
Ketones, ur: NEGATIVE mg/dL
Leukocytes,Ua: NEGATIVE
Nitrite: NEGATIVE
Protein, ur: NEGATIVE mg/dL
Specific Gravity, Urine: 1.011 (ref 1.005–1.030)
pH: 7 (ref 5.0–8.0)

## 2020-12-23 LAB — WET PREP, GENITAL
Clue Cells Wet Prep HPF POC: NONE SEEN
Sperm: NONE SEEN
Trich, Wet Prep: NONE SEEN
Yeast Wet Prep HPF POC: NONE SEEN

## 2020-12-23 LAB — COMPREHENSIVE METABOLIC PANEL
ALT: 17 U/L (ref 0–44)
AST: 19 U/L (ref 15–41)
Albumin: 3.4 g/dL — ABNORMAL LOW (ref 3.5–5.0)
Alkaline Phosphatase: 63 U/L (ref 38–126)
Anion gap: 9 (ref 5–15)
BUN: 5 mg/dL — ABNORMAL LOW (ref 6–20)
CO2: 20 mmol/L — ABNORMAL LOW (ref 22–32)
Calcium: 8.1 mg/dL — ABNORMAL LOW (ref 8.9–10.3)
Chloride: 106 mmol/L (ref 98–111)
Creatinine, Ser: 0.42 mg/dL — ABNORMAL LOW (ref 0.44–1.00)
GFR, Estimated: >60 mL/min (ref >60)
Glucose, Bld: 91 mg/dL (ref 70–99)
Potassium: 3.5 mmol/L (ref 3.5–5.1)
Sodium: 135 mmol/L (ref 135–145)
Total Bilirubin: 0.5 mg/dL (ref 0.3–1.2)
Total Protein: 6.7 g/dL (ref 6.5–8.1)

## 2020-12-23 LAB — HCG, QUANTITATIVE, PREGNANCY: hCG, Beta Chain, Quant, S: 97284 m[IU]/mL — ABNORMAL HIGH (ref <5)

## 2020-12-23 LAB — CBC
HCT: 35.5 % — ABNORMAL LOW (ref 36.0–46.0)
Hemoglobin: 11.3 g/dL — ABNORMAL LOW (ref 12.0–15.0)
MCH: 25.6 pg — ABNORMAL LOW (ref 26.0–34.0)
MCHC: 31.8 g/dL (ref 30.0–36.0)
MCV: 80.5 fL (ref 80.0–100.0)
Platelets: 335 10*3/uL (ref 150–400)
RBC: 4.41 MIL/uL (ref 3.87–5.11)
RDW: 15.6 % — ABNORMAL HIGH (ref 11.5–15.5)
WBC: 7 10*3/uL (ref 4.0–10.5)
nRBC: 0 % (ref 0.0–0.2)

## 2020-12-23 MED ORDER — ONDANSETRON 8 MG PO TBDP: 8.0000 mg | ORAL_TABLET | Freq: Three times a day (TID) | ORAL | 0 refills | Status: DC | PRN

## 2020-12-23 MED ORDER — NIFEDIPINE ER OSMOTIC RELEASE 30 MG PO TB24: 30.0000 mg | ORAL_TABLET | Freq: Every day | ORAL | 1 refills | Status: DC

## 2020-12-23 MED FILL — ONDANSETRON ODT 8 MG TABLET: 8 | 10 days supply | Qty: 30 | Fill #0

## 2020-12-23 MED FILL — NIFEDIPINE ER OSMOTIC RELEA: 30 | 60 days supply | Qty: 60 | Fill #0

## 2020-12-23 NOTE — Discharge Instructions (Signed)
Hipertensin durante el embarazo Hypertension During Pregnancy La hipertensin tambin se denomina presin arterial alta. La presin arterial alta significa que la fuerza de la sangre que se mueve por su cuerpo es lo suficientemente alta como para causarle problemas a usted y al beb. Durante el embarazo pueden producirse diferentes tipos de presin arterial alta. Los tipos son los siguientes:  Presin arterial alta antes de quedar embarazada. Esto se denomina hipertensin crnica..  Puede continuar durante el embarazo. El mdico querr continuar controlndole la presin arterial. Es posible que necesite medicamentos para controlar la presin arterial mientras est embarazada. Deber concurrir a visitas de seguimiento despus de haber tenido el beb.  Presin arterial alta que sube durante el embarazo cuando antes era normal. Esto se denomina hipertensin gestacional. Por lo general, mejorar despus de que tenga el beb, pero el mdico deber controlarle la presin arterial para asegurarse de que est mejorando.  Puede desarrollar presin arterial alta despus del parto. Esto se denomina hipertensin posparto. Suele ocurrir [employer] de las 48 horas despus del nacimiento del beb, pero puede aparecer [employer] despus de dar a luz. La presin arterial muy alta durante el embarazo es una emergencia que necesita tratamiento de inmediato. Jill Alexanders modo me afecta? Si tiene presin arterial alta durante el embarazo, tiene ms probabilidades de desarrollar presin arterial alta:  A medida que envejece.  Si queda embarazada nuevamente. En algunos casos, la presin arterial alta durante el embarazo puede causar lo siguiente:  Accidente cerebrovascular.  Infarto de miocardio.  [employer] riones, los pulmones o el hgado.  Preeclampsia.  Sndrome de HELLP.  Convulsiones.  Problemas con la placenta. Cmo afecta esto al beb? El beb puede:  Nacer antes de tiempo.  No pesar tanto como  debera.  No tolerar bien el trabajo de [place], lo que conduce a un parto por cesrea. Esta afeccin tambin puede provocar la muerte del beb antes del nacimiento (muerte fetal). Cules son los riesgos?  Haber tenido presin arterial alta durante un embarazo anterior.  Tener sobrepeso.  Tener 35aos o ms.  Estar embarazada por primera vez.  Estar embarazada de ms de un beb.  Embarazarse a travs de mtodos de fertilizacin como fertilizacin in vitro (FIV).  Tener otros problemas, como diabetes o enfermedad renal. Qu puedo hacer para reducir mi riesgo?  Mantenga un peso saludable.  Siga una dieta saludable.  Siga las indicaciones del mdico acerca de tratar cualquier problema mdico que tenga antes de quedar embarazada. Es muy importante concurrir a todas las visitas al [employer]. El mdico verificar su presin arterial y se asegurar de que el embarazo progrese como corresponde. El tratamiento debe comenzar con prontitud si se detecta un problema.   Cmo se trata? El tratamiento para la presin arterial alta durante el embarazo puede variar. Depende del tipo de presin arterial alta que tenga y de lo grave que sea.  Si estaba recibiendo un medicamento para la presin arterial antes de quedar embarazada, hable con el mdico. Es posible que tenga que cambiar el medicamento durante el embarazo si no es seguro para su beb.  Si su presin arterial sube durante el embarazo, el mdico puede indicarle un medicamento para tratarla.  Si est en riesgo de tener preeclampsia, el mdico puede pedirle que tome una dosis baja de aspirina mientras est embarazada.  Si tiene presin arterial muy alta, es posible que Hydrologist en el hospital para que usted y el beb puedan ser vigilados de cerca. Es posible que deba tomar  medicamentos para disminuir la presin arterial.  En ciertos casos, si su afeccin empeora, es posible que necesite dar a luz prematuramente. Siga estas  instrucciones en su casa: Comida y bebida  Beba suficiente lquido para mantener el pis (orina) de color amarillo plido.  Evite la cafena.   Estilo de vida  No fume ni consuma ningn producto que contenga nicotina o tabaco. Si necesita ayuda para dejar de fumar, consulte al mdico.  No consuma drogas ni alcohol.  Evite el estrs.  Haga reposo y [employer].  La actividad fsica regular puede ayudar. Consulte al mdico qu tipos de ejercicios son mejores para usted. Instrucciones generales  Use los medicamentos de venta libre y los recetados solamente como se lo haya indicado el mdico.  Concurra a todas las visitas prenatales y de seguimiento. Comunquese con un mdico si:  Tiene sntomas a los que su mdico le indic que debera estar [place], tales como: ? Dolores de [place]. ? Ganas de vomitar (nuseas). ? Vmitos. ? Dolor en el vientre (abdominal). ? Sentirse mareada o aturdida. Solicite ayuda de inmediato si:  Tiene sntomas de problemas graves, como: ? Dolor muy intenso en el vientre que no mejora con el [place]. ? Dolor de cabeza muy intenso que no mejora. ? Visin borrosa. ? Visin doble. ? Vmitos que no mejoran. ? Aumenta de peso de forma rpida y repentina. ? Hinchazn repentina en las manos, los tobillos o el rostro. ? Hemorragia vaginal. ? Sangre en la orina. ? Falta de aire. ? Dolor de pecho. ? Debilidad en un lado del cuerpo. ? Dificultad para hablar.  El beb no se mueve tanto como sera usual. Estos sntomas pueden indicar Radio broadcast assistant. Solicite ayuda de inmediato. Comunquese con el servicio de emergencias de su localidad (911 en los [place]).  No espere a ver si los sntomas desaparecen.  No conduzca por sus propios medios [employer] hospital. Resumen  La presin arterial alta tambin se denomina hipertensin.  La presin arterial alta significa que la fuerza de la sangre que se mueve por su cuerpo es lo suficientemente alta como  para causarle problemas a usted y al beb.  Busque ayuda de inmediato si tiene sntomas de problemas graves debidos a la presin arterial alta.  Concurra a todas las visitas prenatales y de seguimiento. Esta informacin no tiene Theme park manager el consejo del mdico. Asegrese de hacerle al mdico cualquier pregunta que tenga. Document Revised: 10/07/2020 Document Reviewed: 09/06/2020 Elsevier Patient Education  2021 [employer].

## 2020-12-23 NOTE — MAU Provider Note (Addendum)
History     CSN: [id number]  Arrival date and time: 12/23/20 1013   Event Date/Time   First Provider Initiated Contact with Patient 12/23/20 1152      Chief Complaint  Patient presents with  . Fever   HPI  Jennifer Holmes is a 35yo [id number] at [redacted]w[redacted]d by LMP presenting with 6/10 sharp suprapubic pain starting last night that is now 3/10. She said the pain was across her lower abdomen last night and is now local to the suprapubic region. The pain has been constant and does not radiate. She reports nothing makes the pain worse or better. She denies other abdominal pain, VB, clots, discharge, or LOF. She denies urinary frequency, urgency, dysuria, or hematuria.   She reports also having 3 weeks of intermittent fever with TMax 101F. She reports that with this fever she had a dry cough 2 weeks ago w/o SOB. She also reports bilateral ear pain and congestion, rhinorrhea and headaches. She denies N/V/D. Patient tested negative for COVID-19 recently.  Patient's last period was 10/26/20. Periods usually occur from around the 14-17th of every month and last for 4 days. She took home pregnancy test 3-4 weeks ago that was positive. Has had HTN "my whole life" that was being controlled by norvasc, but patient stopped taking this when she found out she was pregnant. She takes blood pressures at home and since then have been in the 150s/110s. Her previous pregnancies were complicated by poorly controlled BP's but denies preeclampsia. She reports that one of children was born at 31 weeks, the other at 35 weeks.    OB History     Gravida  4   Para  3   Term  1   Preterm  2   AB  0   Living  3      SAB  0   IAB  0   Ectopic  0   Multiple  0   Live Births  3        Obstetric Comments  Had a blood transfusion with first pregnancy after her C/S in [place].         Past Medical History:  Diagnosis Date  . Gastroesophageal Reflux Disease (GERD)   . Headache   . HELLP syndrome    . History of blood transfusion for PP hemorrhage   . Postoperative Nausea and Vomiting (PONV)   . Preterm labor     Past Surgical History:  Procedure Laterality Date  . CESAREAN SECTION N/A 03/01/2008    Family History  Problem Relation Age of Onset  . Hypertension Mother   . Heart disease Mother   . Hypertension Brother     Social History   Tobacco Use  . Smoking status: Never Smoker  . Smokeless tobacco: Never Used  Substance Use Topics  . Alcohol use: No  . Drug use: No    Allergies:  Allergies  Allergen Reactions  . Lactose Intolerance (Gi) Other (See Comments)    Reaction:  GI upset    Medications Prior to Admission  Medication Sig Dispense Refill Last Dose  . acetaminophen (TYLENOL) 325 MG tablet Take 650 mg by mouth as needed for moderate pain.    12/23/2020 at Unknown time  . Prenatal Vit-Fe Fumarate-FA (PRENATAL MULTIVITAMIN) TABS tablet Take 1 tablet by mouth daily at 12 noon.   Past Week at Unknown time  . amLODipine (NORVASC) 10 MG tablet Take 1 tablet (10 mg total) by mouth daily. 30 tablet 1   .  docusate sodium (COLACE) 250 MG capsule Take 1 capsule (250 mg total) by mouth daily. 10 capsule 0   . ondansetron (ZOFRAN ODT) 4 MG disintegrating tablet Take 1 tablet (4 mg total) by mouth every 8 (eight) hours as needed for nausea or vomiting. 20 tablet 0   . pantoprazole (PROTONIX) 40 MG tablet Take 1 tablet (40 mg total) by mouth daily. 30 tablet 0   . Vitamin D, Ergocalciferol, (DRISDOL) 1.25 MG (50000 UNIT) CAPS capsule Take 1 capsule (50,000 Units total) by mouth every 7 (seven) days. 12 capsule 5     Review of Systems  Constitutional: Positive for chills and fever.  HENT: Positive for congestion, ear pain, postnasal drip, rhinorrhea and sore throat.   Respiratory: Negative for cough and shortness of breath.   Cardiovascular: Negative for chest pain and leg swelling.  Gastrointestinal: Positive for abdominal pain. Negative for diarrhea, nausea and  vomiting.  Endocrine: Negative for polyuria.  Genitourinary: Positive for pelvic pain and vaginal pain. Negative for dysuria, flank pain, frequency, hematuria, urgency, vaginal bleeding and vaginal discharge.  Musculoskeletal: Positive for arthralgias.  Neurological: Positive for headaches.   Physical Exam   Blood pressure (!) 158/105, pulse 93, temperature 98.5 F (36.9 C), resp. rate 18, last menstrual period 10/26/2020, currently breastfeeding.  Physical Exam Constitutional:      Appearance: Normal appearance. She is not toxic-appearing.  HENT:     Head: Normocephalic and atraumatic.     Right Ear: Tympanic membrane and ear canal normal. There is no impacted cerumen.     Left Ear: Tympanic membrane normal. There is no impacted cerumen.     Ears:     Comments: Left canal erythematous    Nose: No rhinorrhea.     Comments: Left nare erythematous    Mouth/Throat:     Mouth: Mucous membranes are moist.     Pharynx: Posterior oropharyngeal erythema present. No oropharyngeal exudate.  Cardiovascular:     Rate and Rhythm: Normal rate and regular rhythm.     Heart sounds: Normal heart sounds. No murmur heard. No friction rub. No gallop.   Pulmonary:     Effort: Pulmonary effort is normal.     Breath sounds: Normal breath sounds. No wheezing, rhonchi or rales.  Abdominal:     Palpations: Abdomen is soft.     Tenderness: There is abdominal tenderness (suprapubic). There is left CVA tenderness. There is no right CVA tenderness, guarding or rebound.  Musculoskeletal:        General: No swelling or tenderness (negative Homan's sign).     Right lower leg: No edema.     Left lower leg: No edema.  Skin:    General: Skin is warm.  Neurological:     General: No focal deficit present.     Mental Status: She is alert and oriented to person, place, and time.  Psychiatric:        Mood and Affect: Mood normal.        Behavior: Behavior normal.     MAU Course   Procedures None  MDM UA- WNL CMP- [place] 20 CBC- Hgb 11.3 HCG- 97,284 Wet Prep- many WBC GC/Chlamydia- US-  FINDINGS: Intrauterine gestational sac: Visualized-single Yolk sac:  Visualized Embryo:  Visualized Cardiac Activity: Visualized Heart Rate: 170 bpm CRL:  18 mm   8 w   1 d                   [place] EDC: August 03, 2021 Subchorionic hemorrhage:  None Maternal uterus/adnexae: Cervical os is closed. Right ovary measures 2.3 x 2.0 x 1.5 cm. Left ovary measures 2.6 x 1.7 x 2.5 cm. There is no extrauterine pelvic or adnexal mass. No free pelvic fluid. IMPRESSION: Single live intrauterine gestation with estimated gestational age of approximately 8 weeks. No subchorionic hemorrhage. No extrauterine pelvic mass or fluid.  Assessment and Plan  # Viable Single Intrauterine Pregnancy at [redacted]w[redacted]d # [redacted] weeks gestation of pregnancy - [place] and hCG consistent with single viable intrauterine pregnancy at ~[redacted] weeks gestation - Discharge home in stable condition - Instructed to follow up with prenatal care - F/U GC/Chlamydia - Given note for work - Instructed on return precautions - Instructed to return if needed or if condition worsens  # Round Ligament Pain - Instructed on stretching exercises to help with pain - Can continue acetaminophen as needed for pain  # Chronic Hypertension - At home readings and MAU readings of BPs >150s/100s - Take nifedipine XL 30 mg daily  # Viral URI - Negative Covid-19 PCR recently with no new/changing symptoms - Can continue acetaminophen for fever/aches  Meaghan N Eilene Ghazi 12/23/2020, 2:11 PM   CNM attestation:  I have seen and examined this patient and agree with above documentation in the med student's note.   Jennifer Holmes is a 35 y.o. [phone] at [redacted]w[redacted]d reporting suprapubic pain, overall body aches and chills. She denies VB, contractions, vaginal discharge.  PE: Patient Vitals for the past 24 hrs:  BP Temp Pulse Resp  12/23/20 1420  137/90 -- 84 16  12/23/20 1101 (!) 158/105 98.5 F (36.9 C) 93 18   Gen: calm comfortable, NAD Resp: normal effort, no distress Heart: Regular rate Abd: Soft, NT,  ROS, labs, PMH reviewed  Orders Placed This Encounter  Procedures  . Wet prep, genital  . US OB Transvaginal  . US OB Comp Less 14 Wks  . Urinalysis, Routine w reflex microscopic Urine, Clean Catch  . CBC  . Comprehensive metabolic panel  . hCG, quantitative, pregnancy  . Discharge patient Discharge disposition: 01-Home or Self Care; Discharge patient date: 12/23/2020   Meds ordered this encounter  Medications  . NIFEdipine (PROCARDIA-XL/NIFEDICAL-XL) 30 MG 24 hr tablet    Sig: Take 1 tablet (30 mg total) by mouth daily.    Dispense:  60 tablet    Refill:  1    Order Specific Question:   Supervising Provider    Answer:   Venora Maples [[id number]]  . ondansetron (ZOFRAN ODT) 8 MG disintegrating tablet    Sig: Take 1 tablet (8 mg total) by mouth every 8 (eight) hours as needed for nausea or vomiting.    Dispense:  30 tablet    Refill:  0    Order Specific Question:   Supervising Provider    Answer:   Venora Maples [[id number]]      Assessment:  1. Intrauterine pregnancy   2. Abdominal cramps      Plan: - Discharge home in stable condition. - First trimester precautions given - Return to MAU as needed if symptoms worsen. - Return to maternity admissions symptoms worsen -Message sent to [facility] to schedule patient for NOB with MD as patient is high risk due to BP and pre-eclampsia. -Discussed patient's case with Dr. Crissie Reese and will started patient on nifedipine 30 XL; RX sent to pharmacy on file.  Marylene Land, CNM 12/23/2020 3:59 PM

## 2020-12-23 NOTE — MAU Note (Signed)
Pt reports fever x 3 week and pain in both ear. Also c/o lower abd pain last night. More like lower abd pressure .  Nasal congestin started yesterday. Covid test last week was negative.  Pt stopped taking her norvasc 3 weeks ago when she found out she was pregnant. B/p has been elevated at home as well.

## 2020-12-26 LAB — GC/CHLAMYDIA PROBE AMP (~~LOC~~) NOT AT ARMC
Chlamydia: NEGATIVE
Comment: NEGATIVE
Comment: NORMAL
Neisseria Gonorrhea: NEGATIVE

## 2020-12-27 ENCOUNTER — Ambulatory Visit: Payer: Self-pay

## 2020-12-28 ENCOUNTER — Telehealth: Payer: Self-pay | Admitting: Family Medicine

## 2020-12-28 NOTE — Telephone Encounter (Signed)
I called pt for appt reminder and she wants to talk to nurse about medication that is on her mychart. She is not sure if it is for prenatal or not. Please call pt thanks.

## 2020-12-30 NOTE — Telephone Encounter (Signed)
Called pt to discuss her concerns. She stated that she does not feel well and as though she has no "power in her body". She had to leave work on 1/18 because of this problem. Pt also stated that she stopped taking the blood pressure medicine 2 days ago because she was feeling dizzy, had a H/A and wanted to see what was going on with her body. Since then, she has not had H/A or dizziness. Pt stated that on 1/16, she almost went back to the hospital (was seen on 1/14) because she was not able to breathe. She has had this feeling several times since then. While talking with pt, she stated that she was feeling that way again and was having difficulty breathing. I advised pt to return to MAU for evaluation ASAP. I stated that while she is there, a determination can be made regarding her blood pressure medication. Pt voiced understanding and agreed to plan of care.

## 2021-01-03 ENCOUNTER — Inpatient Hospital Stay (HOSPITAL_COMMUNITY)
Admission: AD | Admit: 2021-01-03 | Discharge: 2021-01-03 | Disposition: A | Discharge status: Home / Self Care | Payer: Self-pay | Attending: Obstetrics and Gynecology | Admitting: Obstetrics and Gynecology

## 2021-01-03 ENCOUNTER — Other Ambulatory Visit: Payer: Self-pay | Admitting: Certified Nurse Midwife

## 2021-01-03 ENCOUNTER — Encounter (HOSPITAL_COMMUNITY): Payer: Self-pay | Admitting: Obstetrics and Gynecology

## 2021-01-03 ENCOUNTER — Inpatient Hospital Stay (HOSPITAL_COMMUNITY): Payer: Self-pay

## 2021-01-03 ENCOUNTER — Other Ambulatory Visit (HOSPITAL_COMMUNITY)
Admission: RE | Admit: 2021-01-03 | Discharge: 2021-01-03 | Disposition: A | Discharge status: Home / Self Care | Payer: Self-pay | Source: Ambulatory Visit | Attending: Certified Nurse Midwife | Admitting: Certified Nurse Midwife

## 2021-01-03 ENCOUNTER — Ambulatory Visit: Payer: Self-pay | Attending: Nurse Practitioner

## 2021-01-03 ENCOUNTER — Other Ambulatory Visit: Payer: Self-pay

## 2021-01-03 DIAGNOSIS — Z113 Encounter for screening for infections with a predominantly sexual mode of transmission: Secondary | ICD-10-CM

## 2021-01-03 DIAGNOSIS — O418X10 Other specified disorders of amniotic fluid and membranes, first trimester, not applicable or unspecified: Secondary | ICD-10-CM | POA: Insufficient documentation

## 2021-01-03 DIAGNOSIS — O209 Hemorrhage in early pregnancy, unspecified: Secondary | ICD-10-CM | POA: Insufficient documentation

## 2021-01-03 DIAGNOSIS — O10919 Unspecified pre-existing hypertension complicating pregnancy, unspecified trimester: Secondary | ICD-10-CM

## 2021-01-03 DIAGNOSIS — O468X1 Other antepartum hemorrhage, first trimester: Secondary | ICD-10-CM

## 2021-01-03 DIAGNOSIS — R109 Unspecified abdominal pain: Secondary | ICD-10-CM | POA: Insufficient documentation

## 2021-01-03 DIAGNOSIS — O26891 Other specified pregnancy related conditions, first trimester: Secondary | ICD-10-CM | POA: Insufficient documentation

## 2021-01-03 DIAGNOSIS — O10911 Unspecified pre-existing hypertension complicating pregnancy, first trimester: Secondary | ICD-10-CM | POA: Insufficient documentation

## 2021-01-03 DIAGNOSIS — Z3A09 9 weeks gestation of pregnancy: Secondary | ICD-10-CM | POA: Insufficient documentation

## 2021-01-03 LAB — URINALYSIS, ROUTINE W REFLEX MICROSCOPIC
Bacteria, UA: NONE SEEN
Bilirubin Urine: NEGATIVE
Glucose, UA: NEGATIVE mg/dL
Ketones, ur: NEGATIVE mg/dL
Leukocytes,Ua: NEGATIVE
Nitrite: NEGATIVE
Protein, ur: 30 mg/dL — AB
RBC / HPF: >50 RBC/hpf — ABNORMAL HIGH (ref 0–5)
Specific Gravity, Urine: 1.008 (ref 1.005–1.030)
pH: 6 (ref 5.0–8.0)

## 2021-01-03 LAB — WET PREP, GENITAL
Clue Cells Wet Prep HPF POC: NONE SEEN
Sperm: NONE SEEN
Trich, Wet Prep: NONE SEEN
Yeast Wet Prep HPF POC: NONE SEEN

## 2021-01-03 MED ORDER — LABETALOL HCL 200 MG PO TABS: 200.0000 mg | ORAL_TABLET | Freq: Two times a day (BID) | ORAL | 3 refills | Status: DC

## 2021-01-03 MED FILL — LABETALOL HCL 200 MG TABS: 200 | 30 days supply | Qty: 60 | Fill #0

## 2021-01-03 NOTE — Discharge Instructions (Signed)
Hypertension During Pregnancy Hypertension is also called high blood pressure. High blood pressure means that the force of the blood moving in your body is high enough to cause problems for you and your baby. Different types of high blood pressure can happen during pregnancy. The types are:  High blood pressure before you got pregnant. This is called chronic hypertension.  This can continue during your pregnancy. Your doctor will want to keep checking your blood pressure. You may need medicine to control your blood pressure while you are pregnant. You will need follow-up visits after you have your baby.  High blood pressure that goes up during pregnancy when it was normal before. This is called gestational hypertension. It will often get better after you have your baby, but your doctor will need to watch your blood pressure to make sure that it is getting better.  You may develop high blood pressure after giving birth. This is called postpartum hypertension. This often occurs within 48 hours after childbirth but may occur up to 6 weeks after giving birth. Very high blood pressure during pregnancy is an emergency that needs treatment right away. How does this affect me? If you have high blood pressure during pregnancy, you have a higher chance of developing high blood pressure:  As you get older.  If you get pregnant again. In some cases, high blood pressure during pregnancy can cause:  Stroke.  Heart attack.  Damage to the kidneys, lungs, or liver.  Preeclampsia.  HELLP syndrome.  Seizures.  Problems with the placenta. How does this affect my baby? Your baby may:  Be born early.  Not weigh as much as he or she should.  Not handle labor well, leading to a C-section. This condition may also result in a baby's death before birth (stillbirth). What are the risks?  Having high blood pressure during a past pregnancy.  Being overweight.  Being age 35 or older.  Being pregnant  for the first time.  Being pregnant with more than one baby.  Becoming pregnant using fertility methods, such as IVF.  Having other problems, such as diabetes or kidney disease. What can I do to lower my risk?  Keep a healthy weight.  Eat a healthy diet.  Follow what your doctor tells you about treating any medical problems that you had before you got pregnant. It is very important to go to all of your doctor visits. Your doctor will check your blood pressure and make sure that your pregnancy is progressing as it should. Treatment should start early if a problem is found.   How is this treated? Treatment for high blood pressure during pregnancy can vary. It depends on the type of high blood pressure you have and how serious it is.  If you were taking medicine for your blood pressure before you got pregnant, talk with your doctor. You may need to change the medicine during pregnancy if it is not safe for your baby.  If your blood pressure goes up during pregnancy, your doctor may order medicine to treat this.  If you are at risk for preeclampsia, your doctor may tell you to take a low-dose aspirin while you are pregnant.  If you have very high blood pressure, you may need to stay in the hospital so you and your baby can be watched closely. You may also need to take medicine to lower your blood pressure.  In some cases, if your condition gets worse, you may need to have your baby early.   Follow these instructions at home: Eating and drinking  Drink enough fluid to keep your pee (urine) pale yellow.  Avoid caffeine.   Lifestyle  Do not smoke or use any products that contain nicotine or tobacco. If you need help quitting, ask your doctor.  Do not use alcohol or drugs.  Avoid stress.  Rest and get plenty of sleep.  Regular exercise can help. Ask your doctor what kinds of exercise are best for you. General instructions  Take over-the-counter and prescription medicines only as  told by your doctor.  Keep all prenatal and follow-up visits. Contact a doctor if:  You have symptoms that your doctor told you to watch for, such as: ? Headaches. ? A feeling like you may vomit (nausea). ? Vomiting. ? Belly (abdominal) pain. ? Feeling dizzy or light-headed. Get help right away if:  You have symptoms of serious problems, such as: ? Very bad belly pain that does not get better with treatment. ? A very bad headache that does not get better. ? Blurry vision. ? Double vision. ? Vomiting that does not get better. ? Sudden, fast weight gain. ? Sudden swelling in your hands, ankles, or face. ? Bleeding from your vagina. ? Blood in your pee. ? Shortness of breath. ? Chest pain. ? Weakness on one side of your body. ? Trouble talking.  Your baby is not moving as much as usual. These symptoms may be an emergency. Get help right away. Call your local emergency services (911 in the [place]  Do not wait to see if the symptoms will go away.  Do not drive yourself to the hospital. Summary  High blood pressure is also called hypertension.  High blood pressure means that the force of the blood moving in your body is high enough to cause problems for you and your baby.  Get help right away if you have symptoms of serious problems due to high blood pressure.  Keep all prenatal and follow-up visits. This information is not intended to replace advice given to you by your health care provider. Make sure you discuss any questions you have with your health care provider. Document Revised: 08/18/2020 Document Reviewed: 08/18/2020 Elsevier Patient Education  2021 [employer]  Subchorionic Hematoma  A hematoma is a collection of blood outside of the blood vessels. A subchorionic hematoma is a collection of blood between the outer wall of the embryo (chorion) and the inner wall of the uterus. This condition can cause vaginal bleeding. Early small hematomas usually shrink on  their own and do not affect your baby or pregnancy. When bleeding starts later in pregnancy, or if the hematoma is larger or occurs in older pregnant women, the condition may be more serious. Larger hematomas increase the chances of miscarriage. This condition also increases the risk of:  Premature separation of the placenta from the uterus.  Premature (preterm) labor.  Stillbirth. What are the causes? The exact cause of this condition is not known. It occurs when blood is trapped between the placenta and the uterine wall because the placenta has separated from the original site of implantation. What increases the risk? You are more likely to develop this condition if:  You were treated with fertility medicines.  You became pregnant through in vitro fertilization (IVF). What are the signs or symptoms? Symptoms of this condition include:  Vaginal spotting or bleeding.  Abdominal pain. This is rare. Sometimes you may have no symptoms and the bleeding may only be seen when ultrasound images  are taken (transvaginal ultrasound). How is this diagnosed? This condition is diagnosed based on a physical exam. This includes a pelvic exam. You may also have other tests, including:  Blood tests.  Urine tests.  Ultrasound of the abdomen. How is this treated? Treatment for this condition can vary. Treatment may include:  Watchful waiting. You will be monitored closely for any changes in bleeding.  Medicines.  Activity restriction. This may be needed until the bleeding stops.  A medicine called Rh immunoglobulin. This is given if you have an Rh-negative blood type. It prevents Rh sensitization. Follow these instructions at home:  Stay on bed rest if told to do so by your health care provider.  Do not lift anything that is heavier than 10 lb (4.5 kg), or the limit that you are told by your health care provider.  Track and write down the number of pads you use each day and how soaked  (saturated) they are.  Do not use tampons.  Keep all follow-up visits. This is important. Your health care provider may ask you to have follow-up blood tests or ultrasound tests or both. Contact a health care provider if:  You have any vaginal bleeding.  You have a fever. Get help right away if:  You have severe cramps in your stomach, back, abdomen, or pelvis.  You pass large clots or tissue. Save any tissue for your health care provider to look at.  You faint.  You become light-headed or weak. Summary  A subchorionic hematoma is a collection of blood between the outer wall of the embryo (chorion) and the inner wall of the uterus.  This condition can cause vaginal bleeding.  Sometimes you may have no symptoms and the bleeding may only be seen when ultrasound images are taken.  Treatment may include watchful waiting, medicines, or activity restriction.  Keep all follow-up visits. Get help right away if you have severe cramps or heavy vaginal bleeding. This information is not intended to replace advice given to you by your health care provider. Make sure you discuss any questions you have with your health care provider. Document Revised: 08/22/2020 Document Reviewed: 08/22/2020 Elsevier Patient Education  2021 [employer].

## 2021-01-03 NOTE — MAU Provider Note (Signed)
Chief Complaint:  Abdominal Pain and Vaginal Bleeding   Event Date/Time   First Provider Initiated Contact with Patient 01/03/21 0309     HPI: Jennifer Holmes is a 35 y.o. [phone] at [redacted]w[redacted]d who presents to maternity admissions reporting vaginal bleeding that began overnight as well as sharp lower abdominal pains that are both waves of milder cramping plus random sharper pains and vaginal pressure. Pt stated she was asleep, felt something gush out and realized it was blood. Since then she has had continued dark red vaginal bleeding, enough to stain one of our large pads. Has had a previous TVUS, no mention of [facility].   Also reports problems taking her BP meds - she was switched to procardia after her last visit to MAU but quit taking it two days ago due to severe headaches, weakness, SOB, and dizziness. She stopped going to work because of these symptoms. Once she stopped the procardia, her headaches stopped. She was on norvasc prior to pregnancy and occasionally got headaches from that as well.  Pregnancy Course: To begin care at [facility] as an MD pt  Past Medical History:  Diagnosis Date  . Gastroesophageal Reflux Disease (GERD)   . Headache   . HELLP syndrome   . History of blood transfusion for PP hemorrhage   . Postoperative Nausea and Vomiting (PONV)   . Preterm labor    OB History  Gravida Para Term Preterm AB Living  4 3 1 2  0 3  SAB IAB Ectopic Multiple Live Births  0 0 0 0 3    # Outcome Date GA Lbr Len/2nd Weight Sex Delivery Anes PTL Lv  4 Current           3 Preterm 07/19/17 [redacted]w[redacted]d 03:55 4 lb 3.7 oz (1.92 kg) F VBAC None  LIV     Birth Comments: 49 day NICU stay, required O2  2 Preterm 01/24/15 [redacted]w[redacted]d  6 lb 15.6 oz (3.165 kg) F VBAC Local  LIV     Birth Comments: Born at 34 weeks, maternal HTN, vaginal delivery, jaundice at birth - phototherapy, home after 3 days.  1 Term 03/01/08 [redacted]w[redacted]d  8 lb (3.629 kg) M CS-Unspec Spinal  LIV     Birth Comments: c/s due to heart  problems; pp hemorrhage, required blood transfusion    Obstetric Comments  Had a blood transfusion with first pregnancy after her C/S in [redacted]w[redacted]d.   Past Surgical History:  Procedure Laterality Date  . CESAREAN SECTION N/A 03/01/2008   Family History  Problem Relation Age of Onset  . Hypertension Mother   . Heart disease Mother   . Hypertension Brother    Social History   Tobacco Use  . Smoking status: Never Smoker  . Smokeless tobacco: Never Used  Substance Use Topics  . Alcohol use: No  . Drug use: No   Allergies  Allergen Reactions  . Lactose Intolerance (Gi) Other (See Comments)    Reaction:  GI upset   Medications Prior to Admission  Medication Sig Dispense Refill Last Dose  . acetaminophen (TYLENOL) 325 MG tablet Take 650 mg by mouth as needed for moderate pain.    01/02/2021 at Unknown time  . NIFEdipine (PROCARDIA-XL/NIFEDICAL-XL) 30 MG 24 hr tablet Take 1 tablet (30 mg total) by mouth daily. 60 tablet 1 Past Week at Unknown time  . ondansetron (ZOFRAN ODT) 8 MG disintegrating tablet Take 1 tablet (8 mg total) by mouth every 8 (eight) hours as needed for nausea or vomiting.  30 tablet 0 01/02/2021 at Unknown time  . Prenatal Vit-Fe Fumarate-FA (PRENATAL MULTIVITAMIN) TABS tablet Take 1 tablet by mouth daily at 12 noon.   01/02/2021 at Unknown time  . docusate sodium (COLACE) 250 MG capsule Take 1 capsule (250 mg total) by mouth daily. 10 capsule 0 More than a month at Unknown time  . pantoprazole (PROTONIX) 40 MG tablet Take 1 tablet (40 mg total) by mouth daily. (Patient not taking: Reported on 01/03/2021) 30 tablet 0 Not Taking at Unknown time  . Vitamin D, Ergocalciferol, (DRISDOL) 1.25 MG (50000 UNIT) CAPS capsule Take 1 capsule (50,000 Units total) by mouth every 7 (seven) days. (Patient not taking: Reported on 01/03/2021) 12 capsule 5 Not Taking at Unknown time    I have reviewed patient's Past Medical Hx, Surgical Hx, Family Hx, Social Hx, medications and allergies.    ROS:  Review of Systems  Constitutional: Negative for fatigue and fever.  HENT: Negative for congestion and sore throat.   Eyes: Negative for visual disturbance.  Respiratory: Positive for shortness of breath. Negative for apnea, cough and chest tightness.   Gastrointestinal: Positive for abdominal pain. Negative for nausea and vomiting.  Genitourinary: Positive for vaginal bleeding.  Neurological: Positive for dizziness, light-headedness and headaches. Negative for syncope.  Psychiatric/Behavioral: The patient is nervous/anxious (hx of preterm delivery).    Physical Exam   Patient Vitals for the past 24 hrs:  BP Pulse  01/03/21 0427 (!) 142/85 85  01/03/21 0346 (!) 135/92 74  01/03/21 0330 (!) 134/92 76  01/03/21 0314 137/88 77  01/03/21 0231 (!) 144/98 88  01/03/21 0223 (!) 159/104 87   Constitutional: Well-developed, well-nourished female, red-faced, in mild distress Cardiovascular: normal rate & rhythm, no murmur Respiratory: normal effort, lung sounds clear throughout GI: Abd soft, non-tender, pos BS x 4 MS: Extremities nontender, no edema, normal ROM Neurologic: Alert and oriented x 4.  GU: no CVA tenderness Pelvic: NEFG, physiologic discharge, dark red blood noted coming from the os   Labs: Results for orders placed or performed during the hospital encounter of 01/03/21 (from the past 24 hour(s))  Urinalysis, Routine w reflex microscopic Urine, Clean Catch     Status: Abnormal   Collection Time: 01/03/21  3:19 AM  Result Value Ref Range   Color, Urine RED (A) YELLOW   APPearance CLOUDY (A) CLEAR   Specific Gravity, Urine 1.008 1.005 - 1.030   pH 6.0 5.0 - 8.0   Glucose, UA NEGATIVE NEGATIVE mg/dL   Hgb urine dipstick LARGE (A) NEGATIVE   Bilirubin Urine NEGATIVE NEGATIVE   Ketones, ur NEGATIVE NEGATIVE mg/dL   Protein, ur 30 (A) NEGATIVE mg/dL   Nitrite NEGATIVE NEGATIVE   Leukocytes,Ua NEGATIVE NEGATIVE   RBC / HPF >50 (H) 0 - 5 RBC/hpf   WBC, UA 6-10 0 - 5  WBC/hpf   Bacteria, UA NONE SEEN NONE SEEN   Squamous Epithelial / LPF 0-5 0 - 5  Wet prep, genital     Status: Abnormal   Collection Time: 01/03/21  3:19 AM   Specimen: Urine, Clean Catch  Result Value Ref Range   Yeast Wet Prep HPF POC NONE SEEN NONE SEEN   Trich, Wet Prep NONE SEEN NONE SEEN   Clue Cells Wet Prep HPF POC NONE SEEN NONE SEEN   WBC, Wet Prep HPF POC MANY (A) NONE SEEN   Sperm NONE SEEN     Imaging:  US OB Comp Less 14 Wks  Result Date: 01/03/2021 CLINICAL DATA:  Bleeding EXAM: OBSTETRIC <14 WK ULTRASOUND TECHNIQUE: Transabdominal ultrasound was performed for evaluation of the gestation as well as the maternal uterus and adnexal regions. COMPARISON:  None. FINDINGS: Intrauterine gestational sac: Single Yolk sac:  Visualized. Embryo:  Visualized. Cardiac Activity: Visualized. Heart Rate: 176 bpm CRL:   29.7 mm   9 w 5 d                  [place] EDC: 08/03/2021 Subchorionic hemorrhage: There is a small volume of subchorionic hemorrhage. Maternal uterus/adnexae: The visualized maternal structures are unremarkable. IMPRESSION: Single live IUP at 9 weeks and 5 days with a small volume of subchorionic hemorrhage. Electronically Signed   By: Katherine Mantle M.D.   On: 01/03/2021 02:59    MAU Course: Orders Placed This Encounter  Procedures  . Wet prep, genital  . US OB Comp Less 14 Wks  . Urinalysis, Routine w reflex microscopic Urine, Clean Catch  . Discharge patient   Meds ordered this encounter  Medications  . labetalol (NORMODYNE) 200 MG tablet    Sig: Take 1 tablet (200 mg total) by mouth 2 (two) times daily.    Dispense:  60 tablet    Refill:  3    Order Specific Question:   Supervising Provider    Answer:   Samara Snide    MDM: Repeat U/S to assess bleeding - now shows small [facility]. - discussed with pt, gave reassurance about how common [facility] are and expected course of resolution  Speculum exam with wet prep, GC/CT - normal, GC pending  Pt asked about  her prior labs and if she should be taking extra iron or vitamin D. - advised that hgb is normal, she does not need to add iron on top of her PNV with iron - advised to take small dose of daily vitamin D (2000IU/day)  Spoke with Dr. Donavan Foil about pt's complaints re: norvasc/procardia, he advised switching her to 200mg  labetalol BID. Switch discussed with patient - advised her to take as ordered but report complaints to the office so we can switch or help her symptoms. - Discussed at length the importance of blood pressure control and how high blood pressure affects the pregnant body. Pt amenable to plan.  Assessment: 1. Subchorionic hemorrhage of placenta in first trimester, single or unspecified fetus   2. Vaginal bleeding affecting early pregnancy   3. Chronic hypertension affecting pregnancy    Plan: Discharge home in stable condition with bleeding precautions.     Follow-up Information    [facility] Go on 01/11/2021.   Specialty: Obstetrics and Gynecology Why: as scheduled for initial prenatal visit with Dr. 03/11/2021 information: [address] [place] [phone]              Allergies as of 01/03/2021      Reactions   Lactose Intolerance (gi) Other (See Comments)   Reaction:  GI upset      Medication List    STOP taking these medications   NIFEdipine 30 MG 24 hr tablet Commonly known as: PROCARDIA-XL/NIFEDICAL-XL   pantoprazole 40 MG tablet Commonly known as: PROTONIX   Vitamin D (Ergocalciferol) 1.25 MG (50000 UNIT) Caps capsule Commonly known as: DRISDOL     TAKE these medications   acetaminophen 325 MG tablet Commonly known as: TYLENOL Take 650 mg by mouth as needed for moderate pain.   docusate sodium 250 MG capsule Commonly known as:  COLACE Take 1 capsule (250 mg total) by mouth daily.   labetalol 200 MG tablet Commonly known as: NORMODYNE Take 1 tablet (200 mg  total) by mouth 2 (two) times daily.   ondansetron 8 MG disintegrating tablet Commonly known as: Zofran ODT Take 1 tablet (8 mg total) by mouth every 8 (eight) hours as needed for nausea or vomiting.   prenatal multivitamin Tabs tablet Take 1 tablet by mouth daily at 12 noon.      Edd Arbour, CNM, MSN, IBCLC Certified Nurse Midwife, [facility]

## 2021-01-03 NOTE — MAU Note (Signed)
Patient reports to triage for abdominal pain and bleeding. Patient reports having sharper pain, pressure, and bleeding starting at 0000. Triage assessment completed. Provider to be notified

## 2021-01-04 LAB — GC/CHLAMYDIA PROBE AMP (~~LOC~~) NOT AT ARMC
Chlamydia: NEGATIVE
Comment: NEGATIVE
Comment: NORMAL
Neisseria Gonorrhea: NEGATIVE

## 2021-01-11 ENCOUNTER — Encounter: Payer: Self-pay | Admitting: Family Medicine

## 2021-01-11 ENCOUNTER — Other Ambulatory Visit: Payer: Self-pay

## 2021-01-11 ENCOUNTER — Other Ambulatory Visit: Payer: Self-pay | Admitting: Family Medicine

## 2021-01-11 ENCOUNTER — Other Ambulatory Visit (HOSPITAL_COMMUNITY)
Admission: RE | Admit: 2021-01-11 | Discharge: 2021-01-11 | Disposition: A | Discharge status: Home / Self Care | Payer: Self-pay | Source: Ambulatory Visit | Attending: Family Medicine | Admitting: Family Medicine

## 2021-01-11 ENCOUNTER — Ambulatory Visit (INDEPENDENT_AMBULATORY_CARE_PROVIDER_SITE_OTHER): Payer: Self-pay | Admitting: Family Medicine

## 2021-01-11 VITALS — BP 130/89 | HR 74 | Wt 185.9 lb

## 2021-01-11 DIAGNOSIS — O0990 Supervision of high risk pregnancy, unspecified, unspecified trimester: Secondary | ICD-10-CM | POA: Insufficient documentation

## 2021-01-11 DIAGNOSIS — Z8759 Personal history of other complications of pregnancy, childbirth and the puerperium: Secondary | ICD-10-CM

## 2021-01-11 DIAGNOSIS — K760 Fatty (change of) liver, not elsewhere classified: Secondary | ICD-10-CM

## 2021-01-11 DIAGNOSIS — O468X1 Other antepartum hemorrhage, first trimester: Secondary | ICD-10-CM

## 2021-01-11 DIAGNOSIS — O418X10 Other specified disorders of amniotic fluid and membranes, first trimester, not applicable or unspecified: Secondary | ICD-10-CM

## 2021-01-11 DIAGNOSIS — O34219 Maternal care for unspecified type scar from previous cesarean delivery: Secondary | ICD-10-CM

## 2021-01-11 DIAGNOSIS — O10919 Unspecified pre-existing hypertension complicating pregnancy, unspecified trimester: Secondary | ICD-10-CM

## 2021-01-11 DIAGNOSIS — Z8751 Personal history of pre-term labor: Secondary | ICD-10-CM

## 2021-01-11 DIAGNOSIS — Z23 Encounter for immunization: Secondary | ICD-10-CM

## 2021-01-11 DIAGNOSIS — R509 Fever, unspecified: Secondary | ICD-10-CM

## 2021-01-11 DIAGNOSIS — Z98891 History of uterine scar from previous surgery: Secondary | ICD-10-CM

## 2021-01-11 MED ORDER — ASPIRIN EC 81 MG PO TBEC: 81.0000 mg | DELAYED_RELEASE_TABLET | Freq: Every day | ORAL | 11 refills | Status: DC

## 2021-01-11 NOTE — Progress Notes (Signed)
Subjective:   Jennifer Holmes is a 35 y.o. [phone] at 52w0dby early ultrasound being seen today for her first obstetrical visit.  Her obstetrical history is significant for obesity, hx of pre-eclampsia, hx of prior c-section. Patient does intend to breast feed. Pregnancy history fully reviewed.  Patient reports fatigue. Pregnancy was unplanned, [place]   Reports she has been having recurrent fevers. They happen once or twice a week. She has a thermometer at home and gets readings between 101-102F since December 10. Happens at night mostly. Does not have night sweats associated with fevers. No recent weight loss. Unsure if she has been checked for tuberculosis but does think she had BCG vaccine twice as a child. Has 1 dog, 1 cat in the home. No recent travel. Has had a COVID test in the last month that was negative.   HISTORY: OB History  Gravida Para Term Preterm AB Living  4 3 1 2  0 3  SAB IAB Ectopic Multiple Live Births  0 0 0 0 3    # Outcome Date GA Lbr Len/2nd Weight Sex Delivery Anes PTL Lv  4 Current           3 Preterm 07/19/17 [id number]:55 4 lb 3.7 oz (1.92 kg) F VBAC None  LIV     Birth Comments: 49 day NICU stay, required O2     Name: Butrum,GIRL Kitara     Apgar1: 8  Apgar5: 8  2 Preterm 01/24/15 3583w1d lb 15.6 oz (3.165 kg) F VBAC Local  LIV     Birth Comments: Born at 35 77 weeksaternal HTN, vaginal delivery, jaundice at birth - phototherapy, home after 3 days.     Apgar1: 8  Apgar5: 9  1 Term 03/01/08 40w38w0dlb (3.629 kg) M CS-Unspec Spinal  LIV     Birth Comments: c/s due to heart problems; pp hemorrhage, required blood transfusion    Obstetric Comments  [place] and was normal Past Medical History:  Diagnosis Date  . Gastroesophageal Reflux Disease (GERD)   . Headache   . HELLP syndrome   . History of  blood transfusion for PP hemorrhage   . Postoperative Nausea and Vomiting (PONV)   . Preterm labor    Past Surgical History:  Procedure Laterality Date  . CESAREAN SECTION N/A 03/01/2008   Family History  Problem Relation Age of Onset  . Hypertension Mother   . Heart disease Mother   . Hypertension Brother    Social History   Tobacco Use  . Smoking status: Never Smoker  . Smokeless tobacco: Never Used  Vaping Use  . Vaping Use: Never used  Substance Use Topics  . Alcohol use: No  . Drug use: No   Allergies  Allergen Reactions  . Lactose Intolerance (Gi) Other (See Comments)    Reaction:  GI upset   Current Outpatient Medications on File Prior to Visit  Medication Sig Dispense Refill  . acetaminophen (TYLENOL) 325 MG tablet Take 650 mg by mouth as needed for moderate pain.     . laMarland Kitchenetalol (NORMODYNE) 200 MG tablet Take 1 tablet (200 mg total) by mouth 2 (two) times daily. 60 tablet 3  . ondansetron (ZOFRAN ODT) 8 MG disintegrating tablet Take 1 tablet (8 mg total) by mouth every 8 (  eight) hours as needed for nausea or vomiting. 30 tablet 0  . Prenatal Vit-Fe Fumarate-FA (PRENATAL MULTIVITAMIN) TABS tablet Take 1 tablet by mouth daily at 12 noon.    . docusate sodium (COLACE) 250 MG capsule Take 1 capsule (250 mg total) by mouth daily. (Patient not taking: Reported on 01/11/2021) 10 capsule 0   No current facility-administered medications on file prior to visit.     Exam   Vitals:   01/11/21 0913  BP: 130/89  Pulse: 74  Weight: 185 lb 14.4 oz (84.3 kg)   Fetal Heart Rate (bpm): 159  Uterus:     Pelvic Exam: Perineum: no hemorrhoids, normal perineum   Vulva: normal external genitalia, no lesions   Vagina:  normal mucosa, normal discharge   Cervix: no lesions and normal, pap smear done.    Adnexa: normal adnexa and no mass, fullness, tenderness   Bony Pelvis: average  System: General: well-developed, well-nourished female in no acute distress   Breast:  normal  appearance, no masses or tenderness   Skin: normal coloration and turgor, no rashes   Neurologic: oriented, normal, negative, normal mood   Extremities: normal strength, tone, and muscle mass, ROM of all joints is normal   HEENT PERRLA, extraocular movement intact and sclera clear, anicteric   Mouth/Teeth mucous membranes moist, pharynx normal without lesions and dental hygiene good   Neck supple and no masses   Cardiovascular: regular rate and rhythm   Respiratory:  no respiratory distress, normal breath sounds   Abdomen: soft, non-tender; bowel sounds normal; no masses,  no organomegaly     Assessment:   Pregnancy: [id number] Patient Active Problem List   Diagnosis Date Noted  . Supervision of high risk pregnancy, antepartum 01/11/2021  . Subchorionic hematoma in first trimester 01/11/2021  . History of preterm delivery 01/11/2021  . Hx successful VBAC (vaginal birth after cesarean), currently pregnant 01/11/2021  . History of cesarean delivery 01/11/2021  . Vitamin D deficiency 07/11/2020  . History of severe pre-eclampsia 01/27/2015  . Chronic hypertension affecting pregnancy   . Hepatic steatosis 08/23/2012     Plan:  1. Supervision of high risk pregnancy, antepartum Initial labs drawn. Due for pap, collected today Continue prenatal vitamins. Genetic Screening discussed, she is interested but will try to get financial aid approved prior to drawing. Also discussed doing quad screen if not financially able to do NIPS Ultrasound discussed; fetal anatomic survey: ordered. Problem list reviewed and updated. The nature of [facility] with multiple MDs and other Advanced Practice Providers was explained to patient; also emphasized that residents, students are part of our team. - Cytology - PAP( [place]) - Culture, OB Urine - [place] MFM OB DETAIL +14 WK; Future - CBC/D/Plt+RPR+Rh+ABO+Rub Ab... - Hemoglobin A1c  2. Chronic hypertension affecting  pregnancy Taking labetalol 228m BID with good effect Baseline labs today Discussed increased antenatal testing - Comp Met (CMET) - Protein / creatinine ratio, urine - aspirin EC 81 MG tablet; Take 1 tablet (81 mg total) by mouth daily. Swallow whole.  Dispense: 30 tablet; Refill: 11  3. Hepatic steatosis Baseline CMP today  4. Subchorionic hematoma in first trimester, single or unspecified fetus No bleeding currently  5. History of preterm delivery Prior VBAC at 350and 333 weeksDiscussed Makena, she has done in the past but both she and I are ambivalent given lack of efficacy in recent trials and her recent pregnancies Given handout information, she will consider and discuss again  at next visit  6. Hx successful VBAC (vaginal birth after cesarean), currently pregnant Two successful preterm VBAC  7. History of cesarean delivery With first delivery, no records available  8. History of severe pre-eclampsia Review of d/c summary from prior shows PreE hx as outlined in HPI Start prenatal ASA in two weeks Some question of HELLP but difficult diagnosis due to hepatic steatosis and baseline LFT abnormalities, check LFTs today  9. FUO Patient with ongoing intermittent fevers over the past two months in setting of mild viral illness early in month. Review of chart shows [facility] visit on 12/23/2020 where she reported 3 weeks of fever, dry cough, SOB, rhinorrhea, and headache. At that time reported negative COVID test at outside facility. Thought to be c/w viral URI. On my discussion with patient no apparent risk factors and no other symptoms besides intermittent fever. Review of chart shows no TB testing prior, here or at [facility]. Had CXR in 09/2020 with no evidence of TB or malignancy, borderline cardiomegaly.   Differential is broad, but most likely patient suffering from prolonged viral illness, suspect adenovirus. She reports a daughter had adenovirus and was hospitalized for several weeks as a  child. Other possibilities include TB, rheumatic disease, connective tissue disease, [place], malignancy. Discussed with patient that we could either do an extensive (and possibly expensive) workup at this time or continue to observe for another few weeks until her next appt. Also encouraged her to get a repeat COVID test. After discussion we will watch for a few weeks, if fevers are ongoing will send for the following workup: - [place] screen - ECG - Quant Gold - TSH - CRP - ESR - Rheumatoid Factor - ANA - CBC w Diff - LDH - 2 view CXR - respiratory viral panel to include Adenovirus Will send message to Dr. Elgie Congo who is seeing patient next to follow up.     Routine obstetric precautions reviewed. Return in about 4 weeks (around 02/08/2021) for [facility], ob visit, needs MD.

## 2021-01-11 NOTE — Patient Instructions (Signed)
Hydroxyprogesterone caproate injection for pregnancy Qu es este medicamento? La HIDROXIPROGESTERONA es una hormona femenina. Este medicamento se [place] en mujeres embarazadas que han tenido un parto prematuro en el pasado. Ayuda a disminuir el riesgo de tener nuevamente un beb prematuro. Este medicamento puede ser utilizado para otros usos; si tiene alguna pregunta consulte con su proveedor de atencin mdica o con su farmacutico. MARCAS COMUNES: Makena Qu le debo informar a mi profesional de la salud antes de tomar este medicamento? Necesitan saber si usted presenta alguno de los [employer] o situaciones: cncer de mama, cervical, uterino, o vaginal depresin diabetes o prediabetes enfermedad cardiaca presin sangunea alta antecedentes de cogulos sanguneos enfermedad renal enfermedad heptica enfermedad pulmonar o respiratoria, como asma migraas convulsiones sangrado vaginal una reaccin alrgica o inusual a la hidroxiprogesterona, a otras hormonas, al aceite de ricino, al alcohol benclico, a otros medicamentos, alimentos, colorantes o conservantes si est amamantando a un beb Cmo debo Visual merchandiser medicamento? Este medicamento se administra mediante inyeccin en un msculo o debajo de la piel. Recibir una inyeccin una vez por semana (cada 7 das) del modo indicado durante su Psychiatrist. Lo administra un profesional de Radiographer, therapeutic en un hospital o en un entorno clnico. Hable con su pediatra para informarse acerca del uso de este medicamento en nios. Aunque este medicamento se puede recetar a mujeres embarazadas tan jvenes como de 16 aos de edad, existen precauciones que deben tomarse. Sobredosis: Pngase en contacto inmediatamente con un centro toxicolgico o una sala de urgencia si usted cree que haya tomado demasiado medicamento. ATENCIN: [employer] es solo para usted. No comparta este medicamento con nadie. Qu sucede si me olvido de una dosis? Es importante no  olvidar ninguna dosis. Informe a su mdico o a su profesional de la salud si no puede asistir a Marketing executive. Qu puede interactuar con este medicamento? No se anticipan interacciones significativas. Puede ser que esta lista no menciona todas las posibles interacciones. Informe a su profesional de [employer] de [employer] productos a base de hierbas, medicamentos de [place] o suplementos nutritivos que est tomando. Si usted fuma, consume bebidas alcohlicas o si utiliza drogas ilegales, indqueselo tambin a su profesional de [employer]. Algunas sustancias pueden interactuar con su medicamento. A qu debo estar atento al usar [employer]? Se supervisar su embarazo atentamente mientras reciba este medicamento. Qu efectos secundarios puedo tener al [employer] este medicamento? Efectos secundarios que debe informar a su mdico o a Producer, television/film/video de la salud tan pronto como sea posible: Therapist, art, como erupcin cutnea, comezn/picazn o urticaria, e hinchazn de la cara, los labios o la lengua problemas respiratorios estado de nimo deprimido aumento de la presin sangunea mayor apetito o sed aumento de la frecuencia urinaria signos y sntomas de un cogulo sanguneo, tales como problemas respiratorios; cambios en la visin; dolor en el pecho; dolor de cabeza grave, repentino; dolor, hinchazn, calor en la pierna; dificultad para hablar; entumecimiento o debilidad repentina de la cara, el brazo o la pierna cansancio o debilidad inusual sangrado vaginal inusual color amarillento de los ojos o la piel Efectos secundarios que generalmente no requieren atencin mdica (infrmelos a su mdico o a su profesional de la salud si persisten o si son molestos): diarrea retencin de lquidos e hinchazn nuseas dolor, enrojecimiento o Marketing executive de la inyeccin Puede ser que esta lista no menciona todos los posibles efectos secundarios. Comunquese a su mdico por asesoramiento mdico [employer]. Usted puede  informar los efectos secundarios a la FDA por telfono al 1-800-FDA-1088. Dnde debo guardar mi medicina? Este medicamento se administra en hospitales o clnicas, y no necesitar guardarlo en su casa. ATENCIN: Este folleto es un resumen. Puede ser que no cubra toda la posible informacin. Si usted tiene preguntas acerca de esta medicina, consulte con su mdico, su farmacutico o su profesional de Radiographer, therapeutic.  2021 Elsevier/Gold Standard (2017-09-19 00:00:00)    Eleccin del mtodo anticonceptivo Contraception Choices Los mtodos anticonceptivos son las cosas que usted hace o [place] para Location manager. Tambin se los denomina "anticoncepcin". Hay varios mtodos anticonceptivos. Hable con su mdico sobre el mejor mtodo para usted. Mtodos anticonceptivos hormonales Este tipo de mtodo anticonceptivo contiene hormonas. A continuacin se mencionan algunos tipos de mtodos anticonceptivos hormonales:  Un tubo que se coloca debajo de la piel del brazo (implante). El tubo Insurance claims handler colocado durante 3 aos como mximo.  Inyecciones que se deben aplicar cada 3 meses.  Pldoras que se deben tomar [employer].  Un parche que se debe cambiar 1 vez por semana durante 3 semanas. Despus de [employer], el parche se debe retirar durante 1semana.  Un anillo que se coloca en la vagina. El anillo se deja colocado durante 3 semanas. Luego, se debe retirar de Sports administrator. Despus se coloca un nuevo anillo en la vagina.  Pldoras que se deben tomar despus de tener relaciones sexuales sin proteccin. Estas se denominan pldoras anticonceptivas de emergencia.   Mtodos anticonceptivos de barrera A continuacin se mencionan algunos tipos de mtodos anticonceptivos de barrera:  Una cubierta delgada que se coloca sobre el pene antes de tener sexo (condn masculino). La cubierta se desecha despus de [employer].  Una cubierta blanda y suelta  que se coloca en la vagina antes de tener sexo (condn femenino). La cubierta se desecha despus de [employer].  Un dispositivo de goma que se aplica sobre el cuello uterino (diafragma). Este dispositivo debe fabricarse para usted. Se coloca en la vagina antes de tener sexo. Se debe dejar colocado durante 6 a 8horas despus de [employer]. Se debe retirar en un plazo de 24horas.  Un capuchn pequeo y [employer] se fija sobre el cuello uterino (capuchn cervical). Este capuchn debe fabricarse para usted. El capuchn se debe dejar colocado durante 6 a 8horas despus de Management consultant. Se debe retirar en un plazo de 48horas.  Una esponja que se coloca en la vagina antes de tener sexo. Se debe dejar colocada durante al menos 6horas despus de eBay. Se debe retirar en un plazo de 30horas y desecharse.  Una sustancia qumica que destruye o impide que los espermatozoides ingresen al tero. Esa sustancia qumica se llama espermicida. Se puede presentar en forma de pldora, crema, gel o espuma y se debe colocar en la vagina. La sustancia qumica se debe usar al Lowe[employer] Companies de 10 a antes de eBay.   Mtodos anticonceptivos con un DIU DIU significa "dispositivo intrauterino". Se coloca en el interior del tero. Existen dos tipos diferentes:  DIU hormonal. Este tipo puede permanecer colocado en el tero durante 3 a 5 aos.  DIU de cobre. Este tipo Insurance claims handler colocado en el tero durante 10 aos. Mtodo anticonceptivo permanente A continuacin se mencionan algunos tipos de mtodos anticonceptivos permanentes:  Ciruga para obstruir las trompas de [place].  Colocacin de un dispositivo en cada una de las trompas de [place]. Este mtodo funciona al cabo de 3 meses.  Se deben usar otros mtodos anticonceptivos durante 3 meses.  Cir[place] para atar los conductos que transportan los espermatozoides (vasectoma). Este mtodo funciona al cabo de 3 meses. Se deben usar otros  mtodos anticonceptivos durante 3 meses. Mtodos anticonceptivos por planificacin natural A continuacin se mencionan algunos mtodos anticonceptivos por planificacin natural:  No tener [employer] frtiles de la [place].  Usar un calendario a fin de: ? Hacer un seguimiento de la duracin de cada ciclo menstrual. ? Determinar en [employer] se podra producir Firefighter. ? Planificar no tener [employer] en que se podra producir Firefighter.  Reconocer los signos de la ovulacin y no tener relaciones sexuales durante ese perodo. Craig Staggers en que la mujer puede detectar la ovulacin es controlarse la temperatura.  Esperar para tener sexo hasta despus de la ovulacin. Dnde buscar ms informacin  Centers for Disease Control and Prevention (Centros para el Control y Psychiatrist de Event organiser): [email or web address] Resumen  La anticoncepcin, o los mtodos anticonceptivos, hace referencia a las cosas que usted hace o [place] para Location manager.  Los mtodos anticonceptivos hormonales incluyen implantes, inyecciones, pldoras, parches, anillos vaginales y pldoras anticonceptivas de Associate Professor.  Los mtodos anticonceptivos de barrera pueden incluir preservativos masculinos, preservativos femeninos, diafragmas, capuchones cervicales, esponjas y espermicidas.  [employer] tipos diferentes de DIU (dispositivo intrauterino) anticonceptivo. Un DIU puede colocarse en el tero de una mujer para evitar el embarazo durante varios aos.  Un mtodo anticonceptivo [employer] un procedimiento para hombres, mujeres o ambos. La planificacin familiar natural significa no tener sexo cuando la mujer podra quedar embarazada. Esta informacin no tiene Theme park manager el consejo del mdico. Asegrese de hacerle al mdico cualquier pregunta que tenga. Document Revised: 06/28/2020 Document Reviewed: 06/28/2020 Elsevier Patient Education  2021 [employer].

## 2021-01-12 LAB — CBC/D/PLT+RPR+RH+ABO+RUB AB...
Antibody Screen: NEGATIVE
Basophils Absolute: 0.1 10*3/uL (ref 0.0–0.2)
Basos: 1 %
EOS (ABSOLUTE): 0.2 10*3/uL (ref 0.0–0.4)
Eos: 4 %
HCV Ab: 0.1 s/co ratio (ref 0.0–0.9)
HIV Screen 4th Generation wRfx: NONREACTIVE
Hematocrit: 35 % (ref 34.0–46.6)
Hemoglobin: 11.4 g/dL (ref 11.1–15.9)
Hepatitis B Surface Ag: NEGATIVE
Immature Grans (Abs): 0 10*3/uL (ref 0.0–0.1)
Immature Granulocytes: 0 %
Lymphocytes Absolute: 2.3 10*3/uL (ref 0.7–3.1)
Lymphs: 34 %
MCH: 26 pg — ABNORMAL LOW (ref 26.6–33.0)
MCHC: 32.6 g/dL (ref 31.5–35.7)
MCV: 80 fL (ref 79–97)
Monocytes Absolute: 0.6 10*3/uL (ref 0.1–0.9)
Monocytes: 8 %
Neutrophils Absolute: 3.6 10*3/uL (ref 1.4–7.0)
Neutrophils: 53 %
Platelets: 374 10*3/uL (ref 150–450)
RBC: 4.39 x10E6/uL (ref 3.77–5.28)
RDW: 16.2 % — ABNORMAL HIGH (ref 11.7–15.4)
RPR Ser Ql: NONREACTIVE
Rh Factor: POSITIVE
Rubella Antibodies, IGG: 2.44 index (ref >0.99)
WBC: 6.8 10*3/uL (ref 3.4–10.8)

## 2021-01-12 LAB — COMPREHENSIVE METABOLIC PANEL
ALT: 12 IU/L (ref 0–32)
AST: 14 IU/L (ref 0–40)
Albumin/Globulin Ratio: 1.5 (ref 1.2–2.2)
Albumin: 4.1 g/dL (ref 3.8–4.8)
Alkaline Phosphatase: 66 IU/L (ref 44–121)
BUN/Creatinine Ratio: 13 (ref 9–23)
BUN: 5 mg/dL — ABNORMAL LOW (ref 6–20)
Bilirubin Total: 0.5 mg/dL (ref 0.0–1.2)
CO2: 18 mmol/L — ABNORMAL LOW (ref 20–29)
Calcium: 8.6 mg/dL — ABNORMAL LOW (ref 8.7–10.2)
Chloride: 106 mmol/L (ref 96–106)
Creatinine, Ser: 0.4 mg/dL — ABNORMAL LOW (ref 0.57–1.00)
GFR calc Af Amer: 158 mL/min/{1.73_m2} (ref >59)
GFR calc non Af Amer: 137 mL/min/{1.73_m2} (ref >59)
Globulin, Total: 2.7 g/dL (ref 1.5–4.5)
Glucose: 81 mg/dL (ref 65–99)
Potassium: 4.2 mmol/L (ref 3.5–5.2)
Sodium: 139 mmol/L (ref 134–144)
Total Protein: 6.8 g/dL (ref 6.0–8.5)

## 2021-01-12 LAB — HCV INTERPRETATION

## 2021-01-12 LAB — HEMOGLOBIN A1C
Est. average glucose Bld gHb Est-mCnc: 111 mg/dL
Hgb A1c MFr Bld: 5.5 % (ref 4.8–5.6)

## 2021-01-12 LAB — PROTEIN / CREATININE RATIO, URINE
Creatinine, Urine: 88.6 mg/dL
Protein, Ur: 18.4 mg/dL
Protein/Creat Ratio: 208 mg/g creat — ABNORMAL HIGH (ref 0–200)

## 2021-01-13 ENCOUNTER — Telehealth: Payer: Self-pay | Admitting: Nurse Practitioner

## 2021-01-13 LAB — CULTURE, OB URINE

## 2021-01-13 LAB — URINE CULTURE, OB REFLEX

## 2021-01-13 NOTE — Telephone Encounter (Signed)
Pt states she got the pzifer vaccine only 2. Pt is going to ask her dr if ok she gets the booster.

## 2021-01-16 LAB — CYTOLOGY - PAP
Chlamydia: NEGATIVE
Comment: NEGATIVE
Comment: NEGATIVE
Comment: NORMAL
Diagnosis: UNDETERMINED — AB
High risk HPV: NEGATIVE
Neisseria Gonorrhea: NEGATIVE

## 2021-01-18 ENCOUNTER — Telehealth: Payer: Self-pay | Admitting: Family Medicine

## 2021-01-18 NOTE — Telephone Encounter (Signed)
Please let patient know that her pap smear came back with mildly abnormal cells but her HPV was negative. The abnormal cervical cells are not that concerning because of the negative HPV. There were also abnormal uterine cells, but this most likely because she is pregnant. Because of this finding we need to do a colposcopy at her next visit and possibly again after she delivers. This is to look for abnormal areas on the cervix that may need to be biopsied after she delivers her baby.

## 2021-01-19 NOTE — Telephone Encounter (Signed)
Called pt and informed her of Pap results as stated by Dr. Crissie Reese. She was informed of need for additional office procedure (Colpo) at her next visit on 3/2 due to the abnormal cells on her Pap. Pt voiced understanding. Pt also wanted to know if she is able to have sex. She stated that in previous pregnancies she was told she could not have sex. I advised pt to discuss during her next visit.

## 2021-01-23 ENCOUNTER — Ambulatory Visit: Payer: Self-pay | Admitting: Nurse Practitioner

## 2021-02-08 ENCOUNTER — Other Ambulatory Visit: Payer: Self-pay | Admitting: Obstetrics and Gynecology

## 2021-02-08 ENCOUNTER — Ambulatory Visit (INDEPENDENT_AMBULATORY_CARE_PROVIDER_SITE_OTHER): Payer: Self-pay | Admitting: Obstetrics and Gynecology

## 2021-02-08 ENCOUNTER — Other Ambulatory Visit: Payer: Self-pay

## 2021-02-08 VITALS — BP 139/86 | HR 74

## 2021-02-08 DIAGNOSIS — R87619 Unspecified abnormal cytological findings in specimens from cervix uteri: Secondary | ICD-10-CM

## 2021-02-08 DIAGNOSIS — Z98891 History of uterine scar from previous surgery: Secondary | ICD-10-CM

## 2021-02-08 DIAGNOSIS — Z8751 Personal history of pre-term labor: Secondary | ICD-10-CM

## 2021-02-08 DIAGNOSIS — R87610 Atypical squamous cells of undetermined significance on cytologic smear of cervix (ASC-US): Secondary | ICD-10-CM | POA: Insufficient documentation

## 2021-02-08 DIAGNOSIS — Z3A15 15 weeks gestation of pregnancy: Secondary | ICD-10-CM | POA: Insufficient documentation

## 2021-02-08 DIAGNOSIS — O0990 Supervision of high risk pregnancy, unspecified, unspecified trimester: Secondary | ICD-10-CM

## 2021-02-08 DIAGNOSIS — Z8759 Personal history of other complications of pregnancy, childbirth and the puerperium: Secondary | ICD-10-CM

## 2021-02-08 DIAGNOSIS — O34219 Maternal care for unspecified type scar from previous cesarean delivery: Secondary | ICD-10-CM

## 2021-02-08 DIAGNOSIS — O10919 Unspecified pre-existing hypertension complicating pregnancy, unspecified trimester: Secondary | ICD-10-CM

## 2021-02-08 MED ORDER — LABETALOL HCL 200 MG PO TABS: 200.0000 mg | ORAL_TABLET | Freq: Three times a day (TID) | ORAL | 2 refills | Status: DC

## 2021-02-08 NOTE — Patient Instructions (Addendum)
Second Trimester of Pregnancy  The second trimester of pregnancy is from week 13 through week 27. This is also called months 4 through 6 of pregnancy. This is often the time when you feel your best. During the second trimester:  Morning sickness is less or has stopped.  You may have more energy.  You may feel hungry more often. At this time, your unborn baby (fetus) is growing very fast. At the end of the sixth month, the unborn baby may be up to 12 inches long and weigh about 1 pounds. You will likely start to feel the baby move between 16 and 20 weeks of pregnancy. Body changes during your second trimester Your body continues to go through many changes during this time. The changes vary and generally return to normal after the baby is born. Physical changes  You will gain more weight.  You may start to get stretch marks on your hips, belly (abdomen), and breasts.  Your breasts will grow and may hurt.  Dark spots or blotches may develop on your face.  A dark line from your belly button to the pubic area (linea nigra) may appear.  You may have changes in your hair. Health changes  You may have headaches.  You may have heartburn.  You may have trouble pooping (constipation).  You may have hemorrhoids or swollen, bulging veins (varicose veins).  Your gums may bleed.  You may pee (urinate) more often.  You may have back pain. Follow these instructions at home: Medicines  Take over-the-counter and prescription medicines only as told by your doctor. Some medicines are not safe during pregnancy.  Take a prenatal vitamin that contains at least 600 micrograms (mcg) of folic acid. Eating and drinking  Eat healthy meals that include: ? Fresh fruits and vegetables. ? Whole grains. ? Good sources of protein, such as meat, eggs, or tofu. ? Low-fat dairy products.  Avoid raw meat and unpasteurized juice, milk, and cheese.  You may need to take these actions to prevent or  treat trouble pooping: ? Drink enough fluids to keep your pee (urine) pale yellow. ? Eat foods that are high in fiber. These include beans, whole grains, and fresh fruits and vegetables. ? Limit foods that are high in fat and sugar. These include fried or sweet foods. Activity  Exercise only as told by your doctor. Most people can do their usual exercise during pregnancy. Try to exercise for 30 minutes at least 5 days a week.  Stop exercising if you have pain or cramps in your belly or lower back.  Do not exercise if it is too hot or too humid, or if you are in a place of great height (high altitude).  Avoid heavy lifting.  If you choose to, you may have sex unless your doctor tells you not to. Relieving pain and discomfort  Wear a good support bra if your breasts are sore.  Take warm water baths (sitz baths) to soothe pain or discomfort caused by hemorrhoids. Use hemorrhoid cream if your doctor approves.  Rest with your legs raised (elevated) if you have leg cramps or low back pain.  If you develop bulging veins in your legs: ? Wear support hose as told by your doctor. ? Raise your feet for 15 minutes, 3-4 times a day. ? Limit salt in your food. Safety  Wear your seat belt at all times when you are in a car.  Talk with your doctor if someone is hurting you or yelling  at you a lot. Lifestyle  Do not use hot tubs, steam rooms, or saunas.  Do not douche. Do not use tampons or scented sanitary pads.  Avoid cat litter boxes and soil used by cats. These carry germs that can harm your baby and can cause a loss of your baby by miscarriage or stillbirth.  Do not use herbal medicines, illegal drugs, or medicines that are not approved by your doctor. Do not drink alcohol.  Do not smoke or use any products that contain nicotine or tobacco. If you need help quitting, ask your doctor. General instructions  Keep all follow-up visits. This is important.  Ask your doctor about local  prenatal classes.  Ask your doctor about the right foods to eat or for help finding a counselor. Where to find more information  American Pregnancy Association: americanpregnancy.org  [employer]: www.acog.org  Office on [employer] Health: MightyReward[employer]co.nz Contact a doctor if:  You have a headache that does not go away when you take medicine.  You have changes in how you see, or you see spots in front of your eyes.  You have mild cramps, pressure, or pain in your lower belly.  You continue to feel like you may vomit (nauseous), you vomit, or you have watery poop (diarrhea).  You have bad-smelling fluid coming from your vagina.  You have pain when you pee or your pee smells bad.  You have very bad swelling of your face, hands, ankles, feet, or legs.  You have a fever. Get help right away if:  You are leaking fluid from your vagina.  You have spotting or bleeding from your vagina.  You have very bad belly cramping or pain.  You have trouble breathing.  You have chest pain.  You faint.  You have not felt your baby move for the time period told by your doctor.  You have new or increased pain, swelling, or redness in an arm or leg. Summary  The second trimester of pregnancy is from week 13 through week 27 (months 4 through 6).  Eat healthy meals.  Exercise as told by your doctor. Most people can do their usual exercise during pregnancy.  Do not use herbal medicines, illegal drugs, or medicines that are not approved by your doctor. Do not drink alcohol.  Call your doctor if you get sick or if you notice anything unusual about your pregnancy. This information is not intended to replace advice given to you by your health care provider. Make sure you discuss any questions you have with your health care provider. Document Revised: 05/04/2020 Document Reviewed: 03/10/2020 Elsevier Patient Education  2021 Elsevier  Inc.  Round Ligament Pain  The round ligament is a cord of muscle and tissue that helps support the uterus. It can become a source of pain during pregnancy if it becomes stretched or twisted as the baby grows. The pain usually begins in the second trimester (13-28 weeks) of pregnancy, and it can come and go until the baby is delivered. It is not a serious problem, and it does not cause harm to the baby. Round ligament pain is usually a short, sharp, and pinching pain, but it can also be a dull, lingering, and aching pain. The pain is felt in the lower side of the abdomen or in the groin. It usually starts deep in the groin and moves up to the outside of the hip area. The pain may occur when you:  Suddenly change position, such as  as quickly going from a sitting to standing position.  Roll over in bed.  Cough or sneeze.  Do physical activity. Follow these instructions at home:  Watch your condition for any changes.  When the pain starts, relax. Then try any of these methods to help with the pain: ? Sitting down. ? Flexing your knees up to your abdomen. ? Lying on your side with one pillow under your abdomen and another pillow between your legs. ? Sitting in a warm bath for 15-20 minutes or until the pain goes away.  Take over-the-counter and prescription medicines only as told by your health care provider.  Move slowly when you sit down or stand up.  Avoid long walks if they cause pain.  Stop or reduce your physical activities if they cause pain.  Keep all follow-up visits as told by your health care provider. This is important.   Contact a health care provider if:  Your pain does not go away with treatment.  You feel pain in your back that you did not have before.  Your medicine is not helping. Get help right away if:  You have a fever or chills.  You develop uterine contractions.  You have vaginal bleeding.  You have nausea or vomiting.  You have diarrhea.  You  have pain when you urinate. Summary  Round ligament pain is felt in the lower abdomen or groin. It is usually a short, sharp, and pinching pain. It can also be a dull, lingering, and aching pain.  This pain usually begins in the second trimester (13-28 weeks). It occurs because the uterus is stretching with the growing baby, and it is not harmful to the baby.  You may notice the pain when you suddenly change position, when you cough or sneeze, or during physical activity.  Relaxing, flexing your knees to your abdomen, lying on one side, or taking a warm bath may help to get rid of the pain.  Get help from your health care provider if the pain does not go away or if you have vaginal bleeding, nausea, vomiting, diarrhea, or painful urination. This information is not intended to replace advice given to you by your health care provider. Make sure you discuss any questions you have with your health care provider. Document Revised: 05/14/2018 Document Reviewed: 05/14/2018 Elsevier Patient Education  2021 Elsevier Inc.  

## 2021-02-08 NOTE — Progress Notes (Signed)
   PRENATAL VISIT NOTE  Subjective:  Jennifer Holmes is a 35 y.o. [phone] at [redacted]w[redacted]d being seen today for ongoing prenatal care.  She is currently monitored for the following issues for this high-risk pregnancy and has Hepatic steatosis; Chronic hypertension affecting pregnancy; History of severe pre-eclampsia; Vitamin D deficiency; Supervision of high risk pregnancy, antepartum; Subchorionic hematoma in first trimester; History of preterm delivery; Hx successful VBAC (vaginal birth after cesarean), currently pregnant; History of cesarean delivery; [redacted] weeks gestation of pregnancy; ASCUS of cervix with negative high risk HPV; and Atypical glandular cells of undetermined significance (AGUS) on cervical Pap smear on their problem list.  Patient doing well with no acute concerns today. She reports backache.  Contractions: Not present. Vag. Bleeding: None.  Movement: Absent. Denies leaking of fluid. From conversation it appears she is having some mild round ligament pain.  After review of pap and ASCCP guidelines colpo was held for today.  Recommend pap after delivery.  AGUS eval includes ECC/endo bx, neither can be done while pregnant.  The following portions of the patient's history were reviewed and updated as appropriate: allergies, current medications, past family history, past medical history, past social history, past surgical history and problem list. Problem list updated.  Objective:   Vitals:   02/08/21 1530  BP: 139/86  Pulse: 74    Fetal Status: Fetal Heart Rate (bpm): 146   Movement: Absent     General:  Alert, oriented and cooperative. Patient is in no acute distress.  Skin: Skin is warm and dry. No rash noted.   Cardiovascular: Normal heart rate noted  Respiratory: Normal respiratory effort, no problems with respiration noted  Abdomen: Soft, gravid, appropriate for gestational age.  Pain/Pressure: Present     Pelvic: Cervical exam deferred        Extremities: Normal range  of motion.     Mental Status:  Normal mood and affect. Normal behavior. Normal judgment and thought content.   Assessment and Plan:  Pregnancy: [id number] at [redacted]w[redacted]d  1. Chronic hypertension affecting pregnancy Labetalol increased to 200 mg po TID, BP check in 2 weeks - labetalol (NORMODYNE) 200 MG tablet; Take 1 tablet (200 mg total) by mouth 3 (three) times daily.  Dispense: 90 tablet; Refill: 2  2. History of severe pre-eclampsia No s/sx of pre-E  3. Supervision of high risk pregnancy, antepartum Pt needs anatomy scan scheduled  4. History of preterm delivery   5. Hx successful VBAC (vaginal birth after cesarean), currently pregnant   6. History of cesarean delivery   7. [redacted] weeks gestation of pregnancy   8. ASCUS of cervix with negative high risk HPV See above  9. Atypical glandular cells of undetermined significance (AGUS) on cervical Pap smear See above  Preterm labor symptoms and general obstetric precautions including but not limited to vaginal bleeding, contractions, leaking of fluid and fetal movement were reviewed in detail with the patient.  Please refer to After Visit Summary for other counseling recommendations.   Return in about 4 weeks (around 03/08/2021) for [facility], in person, AFP.   Mariel Aloe, MD Faculty Attending Center for [facility]

## 2021-02-09 MED FILL — LABETALOL HCL 200 MG TABS: 200 | 30 days supply | Qty: 90 | Fill #0

## 2021-02-22 ENCOUNTER — Ambulatory Visit: Payer: Self-pay

## 2021-02-23 ENCOUNTER — Ambulatory Visit: Payer: Self-pay | Admitting: Clinical

## 2021-02-23 ENCOUNTER — Other Ambulatory Visit: Payer: Self-pay

## 2021-02-23 ENCOUNTER — Other Ambulatory Visit: Payer: Self-pay | Admitting: Obstetrics and Gynecology

## 2021-02-23 ENCOUNTER — Ambulatory Visit (INDEPENDENT_AMBULATORY_CARE_PROVIDER_SITE_OTHER): Payer: Self-pay | Admitting: General Practice

## 2021-02-23 VITALS — BP 113/72 | HR 97 | Ht 61.0 in | Wt 194.0 lb

## 2021-02-23 DIAGNOSIS — Z0130 Encounter for examination of blood pressure without abnormal findings: Secondary | ICD-10-CM

## 2021-02-23 DIAGNOSIS — Z658 Other specified problems related to psychosocial circumstances: Secondary | ICD-10-CM

## 2021-02-23 MED ORDER — ONDANSETRON 8 MG PO TBDP: 8.0000 mg | ORAL_TABLET | Freq: Three times a day (TID) | ORAL | 0 refills | Status: DC | PRN

## 2021-02-23 NOTE — Progress Notes (Signed)
Patient presents to office today for blood pressure check as a follow up from recent OB visit on 3/2. Patient's labetalol dose was increased to TID at that appt. Patient states she has been taking the medication daily. She reports some headaches with dizziness and blurry vision. Patient reports normal blood pressures at home lately. Patient reports continued difficulty catching her breath at times, she states it seems to happen whenever she feels fatigued/tired. She also reports difficulty sleeping over the past week & has only slept 2 days but isn't sure why. Patient also describes feeling unsettled recently as well. She requests refill on Zofran. BP 128/79 today. Discussed all the above with Dr Donavan Foil- Zofran refill authorized. Patient will see [facility] interns today to see if that may assist with her sleep/shortness of breath. Advised patient to keep a journal of when shortness of breath occurs to see if we can establish a pattern around it. Advised her to contact us if it worsens, blood pressure becomes high, or headaches worsen. Patient verbalized understanding.   While patient was talking with [facility] interns, she reports experiencing an episode of fatigue, feeling hot all over, & shortness of breath. BP 113/72, pulse 97 O2 100%. Reassured patient vital signs are within normal limits and "episode" is likely related to pregnancy hormones.   Chase Caller RN BSN 02/23/21

## 2021-02-23 NOTE — BH Specialist Note (Signed)
I reviewed patient visit with the [facility] Intern, and I concur with the treatment plan, as documented in the [facility] Intern note.   No charge for this visit due to [facility] Intern seeing patient.  Hulda Marin, MSW, LCSW Integrated Behavioral Health Clinician Center for [facility] at [facility] for [facility]  Integrated Behavioral Health Initial In-Person Visit  MRN: [id number] Name: Jennifer Holmes  Number of Integrated Behavioral Health Clinician visits:: 1/6 Session Start time: 3:50  Session End time: 4:50 Total time: 60 minutes  Types of Service: Individual psychotherapy  Interpretor:No.   Warm Hand Off Completed.       Subjective: Jennifer Holmes is a 35 y.o. female  Patient was referred by  for . Patient reports the following symptoms/concerns: fatigue, dizziness, restlessness, tingling sensations in her legs Duration of problem: Current Pregnancy; Severity of problem: moderate  Objective: Mood: Euthymic and Affect: Appropriate Risk of harm to self or others: No plan to harm self or others  Life Context: Family and Social: Husband and children School/Work: Currently unemployed Self-Care: Showering/Spending time with children Life Changes: Recent pregnancy, left job due to health concerns  Patient and/or Family's Strengths/Protective Factors: Social connections and Sense of purpose  Goals Addressed: Patient will: 1. Reduce symptoms of: insomnia 2. Increase knowledge and/or ability of: coping skills and healthy habits  3. Demonstrate ability to: Increase healthy adjustment to current life circumstances  Progress towards Goals: Ongoing  Interventions: Interventions utilized: Mindfulness or Relaxation Training and Sleep Hygiene  Standardized Assessments completed: Not Needed  Patient and/or Family Response: Pt expressed understanding of treatment plan and willingness to adhere to it.  Patient Centered Plan: Patient is on the following  Treatment Plan(s):  Following up with provider, practice mindfulness techniques.  Assessment: Patient currently experiencing Pyschosocial stressors.   Patient may benefit from following up with her provider.  Plan: 1. Follow up with behavioral health clinician on : 03/08/21 at 8:15AM 2. Behavioral recommendations: follow up with provider and practice mindfulness techniques such as deep breathing, and sleep hygiene 3. Referral(s): Integrated [employer] (In Clinic)  Bea Graff (Supervisor: Hulda Marin)

## 2021-02-24 NOTE — Progress Notes (Signed)
Patient was assessed and managed by nursing staff during this encounter. I have reviewed the chart and agree with the documentation and plan. I have also made any necessary editorial changes.  Warden Fillers, MD 02/24/2021 8:19 AM

## 2021-03-07 ENCOUNTER — Other Ambulatory Visit: Payer: Self-pay

## 2021-03-07 ENCOUNTER — Encounter: Payer: Self-pay | Admitting: *Deleted

## 2021-03-07 ENCOUNTER — Ambulatory Visit: Payer: Self-pay | Attending: Family Medicine

## 2021-03-07 ENCOUNTER — Ambulatory Visit: Payer: Self-pay | Admitting: *Deleted

## 2021-03-07 DIAGNOSIS — O0990 Supervision of high risk pregnancy, unspecified, unspecified trimester: Secondary | ICD-10-CM | POA: Insufficient documentation

## 2021-03-08 ENCOUNTER — Other Ambulatory Visit: Payer: Self-pay

## 2021-03-08 ENCOUNTER — Encounter: Payer: Self-pay | Admitting: Obstetrics and Gynecology

## 2021-03-08 ENCOUNTER — Ambulatory Visit (INDEPENDENT_AMBULATORY_CARE_PROVIDER_SITE_OTHER): Payer: Self-pay | Admitting: Obstetrics and Gynecology

## 2021-03-08 ENCOUNTER — Other Ambulatory Visit: Payer: Self-pay | Admitting: *Deleted

## 2021-03-08 VITALS — BP 128/88 | HR 85 | Wt 198.0 lb

## 2021-03-08 DIAGNOSIS — O0990 Supervision of high risk pregnancy, unspecified, unspecified trimester: Secondary | ICD-10-CM

## 2021-03-08 DIAGNOSIS — O34219 Maternal care for unspecified type scar from previous cesarean delivery: Secondary | ICD-10-CM

## 2021-03-08 DIAGNOSIS — O468X1 Other antepartum hemorrhage, first trimester: Secondary | ICD-10-CM

## 2021-03-08 DIAGNOSIS — Z3A19 19 weeks gestation of pregnancy: Secondary | ICD-10-CM

## 2021-03-08 DIAGNOSIS — Z8759 Personal history of other complications of pregnancy, childbirth and the puerperium: Secondary | ICD-10-CM

## 2021-03-08 DIAGNOSIS — Z98891 History of uterine scar from previous surgery: Secondary | ICD-10-CM

## 2021-03-08 DIAGNOSIS — O10919 Unspecified pre-existing hypertension complicating pregnancy, unspecified trimester: Secondary | ICD-10-CM

## 2021-03-08 DIAGNOSIS — O418X10 Other specified disorders of amniotic fluid and membranes, first trimester, not applicable or unspecified: Secondary | ICD-10-CM

## 2021-03-08 LAB — POCT URINALYSIS DIP (DEVICE)
Bilirubin Urine: NEGATIVE
Glucose, UA: NEGATIVE mg/dL
Hgb urine dipstick: NEGATIVE
Ketones, ur: NEGATIVE mg/dL
Nitrite: NEGATIVE
Protein, ur: NEGATIVE mg/dL
Specific Gravity, Urine: 1.025 (ref 1.005–1.030)
Urobilinogen, UA: 0.2 mg/dL (ref 0.0–1.0)
pH: 6.5 (ref 5.0–8.0)

## 2021-03-08 MED ORDER — CYCLOBENZAPRINE HCL 10 MG PO TABS
10.0000 mg | ORAL_TABLET | Freq: Three times a day (TID) | ORAL | 2 refills | Status: DC | PRN
Start: 2021-03-08 — End: 2021-05-02

## 2021-03-08 NOTE — Patient Instructions (Signed)
PREGNANCY SUPPORT BELT: You are not alone, Seventy-five percent of women have some sort of abdominal or back pain at some point in their pregnancy. Your baby is growing at a fast pace, which means that your whole body is rapidly trying to adjust to the changes. As your uterus grows, your back may start feeling a bit under stress and this can result in back or abdominal pain that can go from mild, and therefore bearable, to severe pains that will not allow you to sit or lay down comfortably, When it comes to dealing with pregnancy-related pains and cramps, some pregnant women usually prefer natural remedies, which the market is filled with nowadays. For example, wearing a pregnancy support belt can help ease and lessen your discomfort and pain. WHAT ARE THE BENEFITS OF WEARING A PREGNANCY SUPPORT BELT? A pregnancy support belt provides support to the lower portion of the belly taking some of the weight of the growing uterus and distributing to the other parts of your body. It is designed make you comfortable and gives you extra support. Over the years, the pregnancy apparel market has been studying the needs and wants of pregnant women and they have come up with the most comfortable pregnancy support belts that woman could ever ask for. In fact, you will no longer have to wear a stretched-out or bulky pregnancy belt that is visible underneath your clothes and makes you feel even more uncomfortable. Nowadays, a pregnancy support belt is made of comfortable and stretchy materials that will not irritate your skin but will actually make you feel at ease and you will not even notice you are wearing it. They are easy to put on and adjust during the day and can be worn at night for additional support.  BENEFITS: . Relives Back pain . Relieves Abdominal Muscle and Leg Pain . Stabilizes the Pelvic Ring . Offers a Cushioned Abdominal Lift Pad . Relieves pressure on the Sciatic Nerve Within Minutes WHERE TO GET  YOUR PREGNANCY BELT: [employer] [phone] @[phone]  [address] [place], [place] OR you can purchase one on [employer]     Sacroiliac Joint Dysfunction  Sacroiliac joint dysfunction is a condition that causes inflammation on one or both sides of the sacroiliac (SI) joint. The SI joint is the joint between two bones of the pelvis called the sacrum and the ilium. The sacrum is the bone at the base of the spine. The ilium is the large bone that forms the hip. This condition causes deep aching or burning pain in the low back. In some cases, the pain may also spread into one or both buttocks, hips, or thighs. What are the causes? This condition may be caused by:  Pregnancy. During pregnancy, extra stress is put on the SI joints because the pelvis widens.  Injury, such as: ? Injuries from car crashes. ? Sports-related injuries. ? Work-related injuries.  Having one leg that is shorter than the other.  Conditions that affect the joints, such as: ? Rheumatoid arthritis. ? Gout. ? Psoriatic arthritis. ? Joint infection (septic arthritis). Sometimes, the cause of SI joint dysfunction is not known. What are the signs or symptoms? Symptoms of this condition include:  Aching or burning pain in the lower back. The pain may also spread to other areas, such as: ? Buttocks. ? Groin. ? Thighs.  Muscle spasms in or around the painful areas.  Increased pain when standing, walking, running, stair climbing, bending, or lifting. How is this  diagnosed? This condition is diagnosed with a physical exam and your medical history. During the exam, the health care provider may move one or both of your legs to different positions to check for pain. Various tests may be done to confirm the diagnosis, including:  Imaging tests to look for other causes of pain. These may include: ? MRI. ? CT scan. ? Bone scan.  Diagnostic injection. A numbing medicine is injected into the SI joint  using a needle. If your pain is temporarily improved or stopped after the injection, this can indicate that SI joint dysfunction is the problem. How is this treated? Treatment depends on the cause and severity of your condition. Treatment options can be noninvasive and may include:  Ice or heat applied to the lower back area after an injury. This may help reduce pain and muscle spasms.  Medicines to relieve pain or inflammation or to relax the muscles.  Wearing a back brace (sacroiliac brace) to help support the joint while your back is healing.  Physical therapy to increase muscle strength around the joint and flexibility at the joint. This may also involve learning proper body positions and ways of moving to relieve stress on the joint.  Direct manipulation of the SI joint.  Use of a device that provides electrical stimulation to help reduce pain at the joint. Other treatments may include:  Injections of steroid medicine into the joint to reduce pain and swelling.  Radiofrequency ablation. This treatment uses heat to burn away nerves that are carrying pain messages from the joint.  Surgery to put in screws and plates that limit or prevent joint motion. This is rare. Follow these instructions at home: Medicines  Take over-the-counter and prescription medicines only as told by your health care provider.  Ask your health care provider if the medicine prescribed to you: ? Requires you to avoid driving or using machinery. ? Can cause constipation. You may need to take these actions to prevent or treat constipation:  Drink enough fluid to keep your urine pale yellow.  Take over-the-counter or prescription medicines.  Eat foods that are high in fiber, such as beans, whole grains, and fresh fruits and vegetables.  Limit foods that are high in fat and processed sugars, such as fried or sweet foods. If you have a brace:  Wear the brace as told by your health care provider. Remove it  only as told by your health care provider.  Keep the brace clean.  If the brace is not waterproof: ? Do not let it get wet. ? Cover it with a watertight covering when you take a bath or a shower. Managing pain, stiffness, and swelling  Icing can help with pain and swelling. Heat may help with muscle tension or spasms. Ask your health care provider if you should use ice or heat.  If directed, put ice on the affected area: ? If you have a removable brace, remove it as told by your health care provider. ? Put ice in a plastic bag. ? Place a towel between your skin and the bag. ? Leave the ice on for 20 minutes, 2-3 times a day. ? Remove the ice if your skin turns bright red. This is very important. If you cannot feel pain, heat, or cold, you have a greater risk of damage to the area.  If directed, apply heat to the affected area as often as told by your health care provider. Use the heat source that your health care provider recommends,  such as a moist heat pack or a heating pad. ? Place a towel between your skin and the heat source. ? Leave the heat on for 20-30 minutes. ? Remove the heat if your skin turns bright red. This is especially important if you are unable to feel pain, heat, or cold. You may have a greater risk of getting burned.      General instructions  Rest as needed. Return to your normal activities as told by your health care provider. Ask your health care provider what activities are safe for you.  Do exercises as told by your health care provider or physical therapist.  Keep all follow-up visits. This is important. Contact a health care provider if:  Your pain is not controlled with medicine.  You have a fever.  Your pain is getting worse. Get help right away if:  You have weakness, numbness, or tingling in your legs or feet.  You lose control of your bladder or bowels. Summary  Sacroiliac (SI) joint dysfunction is a condition that causes inflammation on  one or both sides of the SI joint.  This condition causes deep aching or burning pain in the low back. In some cases, the pain may also spread into one or both buttocks, hips, or thighs.  Treatment depends on the cause and severity of your condition. It may include medicines to reduce pain and swelling or to relax muscles. This information is not intended to replace advice given to you by your health care provider. Make sure you discuss any questions you have with your health care provider. Document Revised: 04/07/2020 Document Reviewed: 04/07/2020 Elsevier Patient Education  2021 [employer].

## 2021-03-08 NOTE — Progress Notes (Signed)
   PRENATAL VISIT NOTE  Subjective:  Jennifer Holmes is a 35 y.o. [phone] at [redacted]w[redacted]d being seen today for ongoing prenatal care.  She is currently monitored for the following issues for this high-risk pregnancy and has Hepatic steatosis; Chronic hypertension affecting pregnancy; History of severe pre-eclampsia; Vitamin D deficiency; Supervision of high risk pregnancy, antepartum; Subchorionic hematoma in first trimester; History of preterm delivery; Hx successful VBAC (vaginal birth after cesarean), currently pregnant; History of cesarean delivery; [redacted] weeks gestation of pregnancy; ASCUS of cervix with negative high risk HPV; and Atypical glandular cells of undetermined significance (AGUS) on cervical Pap smear on their problem list.  Patient reports backache. Has had backpain since falling back in November. Takes tylenol with some relief. Not doing any type of exercise or stretching.  Contractions: Not present. Vag. Bleeding: None.  Movement: Present. Denies leaking of fluid.   The following portions of the patient's history were reviewed and updated as appropriate: allergies, current medications, past family history, past medical history, past social history, past surgical history and problem list.   Objective:   Vitals:   03/08/21 1534  BP: 128/88  Pulse: 85  Weight: 197 lb 15.6 oz (89.8 kg)    Fetal Status: Fetal Heart Rate (bpm): 145   Movement: Present     General:  Alert, oriented and cooperative. Patient is in no acute distress.  Skin: Skin is warm and dry. No rash noted.   Cardiovascular: Normal heart rate noted  Respiratory: Normal respiratory effort, no problems with respiration noted  Abdomen: Soft, gravid, appropriate for gestational age.  Pain/Pressure: Present     Pelvic: Cervical exam deferred        Extremities: Normal range of motion.  Edema: None  Mental Status: Normal mood and affect. Normal behavior. Normal judgment and thought content.   Assessment and Plan:   Pregnancy: [id number] at [redacted]w[redacted]d 1. Supervision of high risk pregnancy, antepartum -doing well overall today. BP normal. -discussed back pain, patient unfortunately without insurance. Provided at home exercises, script sent for flexeril. She will consider PT. Also discussed maternity belt.    2. Chronic hypertension affecting pregnancy -continue labetalol TID, BP appropriate today   3. History of severe pre-eclampsia   4. Subchorionic hematoma in first trimester, single or unspecified fetus    5. [redacted] weeks gestation of pregnancy   6. History of cesarean delivery -planning VBAC, will need to sign consent forms for TOLAC  7. Hx successful VBAC (vaginal birth after cesarean), currently pregnant   Preterm labor symptoms and general obstetric precautions including but not limited to vaginal bleeding, contractions, leaking of fluid and fetal movement were reviewed in detail with the patient. Please refer to After Visit Summary for other counseling recommendations.   Return in about 4 weeks (around 04/05/2021) for OB.  Future Appointments  Date Time Provider Department Center  04/05/2021  3:00 PM [facility] NURSE [facility]  04/05/2021  3:15 PM [facility] US2 [facility]  04/06/2021  3:55 PM Adam Phenix, MD [facility]    Gita Kudo, MD

## 2021-04-05 ENCOUNTER — Encounter: Payer: Self-pay | Admitting: *Deleted

## 2021-04-05 ENCOUNTER — Ambulatory Visit: Payer: Self-pay | Attending: Obstetrics

## 2021-04-05 ENCOUNTER — Ambulatory Visit: Payer: Self-pay | Admitting: *Deleted

## 2021-04-05 ENCOUNTER — Other Ambulatory Visit: Payer: Self-pay

## 2021-04-05 DIAGNOSIS — O0990 Supervision of high risk pregnancy, unspecified, unspecified trimester: Secondary | ICD-10-CM | POA: Insufficient documentation

## 2021-04-05 DIAGNOSIS — Z3A23 23 weeks gestation of pregnancy: Secondary | ICD-10-CM

## 2021-04-05 DIAGNOSIS — O34219 Maternal care for unspecified type scar from previous cesarean delivery: Secondary | ICD-10-CM

## 2021-04-05 DIAGNOSIS — O99212 Obesity complicating pregnancy, second trimester: Secondary | ICD-10-CM

## 2021-04-05 DIAGNOSIS — Z98891 History of uterine scar from previous surgery: Secondary | ICD-10-CM | POA: Insufficient documentation

## 2021-04-05 DIAGNOSIS — O09212 Supervision of pregnancy with history of pre-term labor, second trimester: Secondary | ICD-10-CM

## 2021-04-05 DIAGNOSIS — E669 Obesity, unspecified: Secondary | ICD-10-CM

## 2021-04-05 DIAGNOSIS — Z362 Encounter for other antenatal screening follow-up: Secondary | ICD-10-CM

## 2021-04-05 DIAGNOSIS — O3442 Maternal care for other abnormalities of cervix, second trimester: Secondary | ICD-10-CM

## 2021-04-05 DIAGNOSIS — R87619 Unspecified abnormal cytological findings in specimens from cervix uteri: Secondary | ICD-10-CM

## 2021-04-05 DIAGNOSIS — O10012 Pre-existing essential hypertension complicating pregnancy, second trimester: Secondary | ICD-10-CM

## 2021-04-06 ENCOUNTER — Other Ambulatory Visit: Payer: Self-pay | Admitting: *Deleted

## 2021-04-06 ENCOUNTER — Encounter: Payer: Self-pay | Admitting: Obstetrics & Gynecology

## 2021-04-06 DIAGNOSIS — O10912 Unspecified pre-existing hypertension complicating pregnancy, second trimester: Secondary | ICD-10-CM

## 2021-04-12 ENCOUNTER — Encounter: Payer: Self-pay | Admitting: Obstetrics & Gynecology

## 2021-04-20 ENCOUNTER — Ambulatory Visit (INDEPENDENT_AMBULATORY_CARE_PROVIDER_SITE_OTHER): Payer: Self-pay | Admitting: Obstetrics and Gynecology

## 2021-04-20 ENCOUNTER — Other Ambulatory Visit (HOSPITAL_COMMUNITY)
Admission: RE | Admit: 2021-04-20 | Discharge: 2021-04-20 | Disposition: A | Discharge status: Home / Self Care | Payer: Self-pay | Source: Ambulatory Visit | Attending: Obstetrics & Gynecology | Admitting: Obstetrics & Gynecology

## 2021-04-20 ENCOUNTER — Other Ambulatory Visit: Payer: Self-pay

## 2021-04-20 VITALS — BP 133/82 | HR 99 | Wt 207.4 lb

## 2021-04-20 DIAGNOSIS — O0990 Supervision of high risk pregnancy, unspecified, unspecified trimester: Secondary | ICD-10-CM

## 2021-04-20 DIAGNOSIS — Z98891 History of uterine scar from previous surgery: Secondary | ICD-10-CM

## 2021-04-20 DIAGNOSIS — Z3A25 25 weeks gestation of pregnancy: Secondary | ICD-10-CM

## 2021-04-20 DIAGNOSIS — R300 Dysuria: Secondary | ICD-10-CM

## 2021-04-20 DIAGNOSIS — O10919 Unspecified pre-existing hypertension complicating pregnancy, unspecified trimester: Secondary | ICD-10-CM

## 2021-04-20 LAB — GLUCOSE, CAPILLARY: Glucose-Capillary: 186 mg/dL — ABNORMAL HIGH (ref 70–99)

## 2021-04-20 LAB — POCT URINALYSIS DIP (DEVICE)
Bilirubin Urine: NEGATIVE
Glucose, UA: 500 mg/dL — AB
Leukocytes,Ua: NEGATIVE
Nitrite: NEGATIVE
Protein, ur: NEGATIVE mg/dL
Specific Gravity, Urine: 1.025 (ref 1.005–1.030)
Urobilinogen, UA: 0.2 mg/dL (ref 0.0–1.0)
pH: 6.5 (ref 5.0–8.0)

## 2021-04-20 NOTE — Progress Notes (Signed)
   PRENATAL VISIT NOTE  Subjective:  Jennifer Holmes is a 35 y.o. [phone] at [redacted]w[redacted]d being seen today for ongoing prenatal care.  She is currently monitored for the following issues for this high-risk pregnancy and has Hepatic steatosis; Chronic hypertension affecting pregnancy; History of severe pre-eclampsia; Vitamin D deficiency; Supervision of high risk pregnancy, antepartum; Subchorionic hematoma in first trimester; History of preterm delivery; Hx successful VBAC (vaginal birth after cesarean), currently pregnant; History of cesarean delivery; [redacted] weeks gestation of pregnancy; ASCUS of cervix with negative high risk HPV; and Atypical glandular cells of undetermined significance (AGUS) on cervical Pap smear on their problem list.  Patient reports urinary frequency.  Contractions: Irritability. Vag. Bleeding: None.  Movement: Present. Denies leaking of fluid. Reports constant urinary frequency without dysuria. Does report intermittent cramping/contractions. No vaginal bleeding or leaking of fluid. No vaginal discharge or itching.   The following portions of the patient's history were reviewed and updated as appropriate: allergies, current medications, past family history, past medical history, past social history, past surgical history and problem list.   Objective:   Vitals:   04/20/21 1600  BP: 133/82  Pulse: 99  Weight: 207 lb 6.4 oz (94.1 kg)    Fetal Status: Fetal Heart Rate (bpm): 151   Movement: Present     General:  Alert, oriented and cooperative. Patient is in no acute distress.  Skin: Skin is warm and dry. No rash noted.   Cardiovascular: Normal heart rate noted  Respiratory: Normal respiratory effort, no problems with respiration noted  Abdomen: Soft, gravid, appropriate for gestational age.  Pain/Pressure: Present     Pelvic: Cervical exam performed in the presence of a chaperone Dilation: Fingertip Effacement (%): Thick Station: Ballotable  Extremities: Normal range  of motion.  Edema: None  Mental Status: Normal mood and affect. Normal behavior. Normal judgment and thought content.   Assessment and Plan:  Pregnancy: [id number] at [redacted]w[redacted]d 1. Supervision of high risk pregnancy, antepartum -cervical exam performed given report of cramping and hx of preterm delivery. Cervix is fingertip.   2. Chronic hypertension affecting pregnancy At goal on labetalol 200mg  TID   3. [redacted] weeks gestation of pregnancy   4. Urinary Frequency  -Cervix closed on exam. Swab obtained.  -UA neg for nitrites and LE. Positive for 500 sugars. Random CBG 186. Will have her return ASAP for 2hr gtt. Suspect frequency related to glucosuria and suspect patient has diabetes. Discussed with patient and she is aware. Provided information and will have her come back asap for testing.   5. History of cesarean delivery Now unsure if wants CS or VBAC. Really wants BTL but does not have full medicaid. Discussed IUD, need to continue discussion.   Preterm labor symptoms and general obstetric precautions including but not limited to vaginal bleeding, contractions, leaking of fluid and fetal movement were reviewed in detail with the patient. Please refer to After Visit Summary for other counseling recommendations.   Return in about 1 week (around 04/27/2021) for return ASAP for 2hr gtt .  Future Appointments  Date Time Provider Department Center  04/25/2021  8:20 AM [facility]  05/02/2021  2:15 PM 05/04/2021, MD [facility]  05/03/2021  3:00 PM [facility] NURSE [facility]  05/03/2021  3:15 PM [facility] US2 [facility]    [facility] MD

## 2021-04-20 NOTE — Patient Instructions (Signed)
Diabetes Mellitus Basics  Diabetes mellitus, or diabetes, is a long-term (chronic) disease. It occurs when the body does not properly use sugar (glucose) that is released from food after you eat. Diabetes mellitus may be caused by one or both of these problems:  Your pancreas does not make enough of a hormone called insulin.  Your body does not react in a normal way to the insulin that it makes. Insulin lets glucose enter cells in your body. This gives you energy. If you have diabetes, glucose cannot get into cells. This causes high blood glucose (hyperglycemia). How to treat and manage diabetes You may need to take insulin or other diabetes medicines daily to keep your glucose in balance. If you are prescribed insulin, you will learn how to give yourself insulin by injection. You may need to adjust the amount of insulin you take based on the foods that you eat. You will need to check your blood glucose levels using a glucose monitor as told by your health care provider. The readings can help determine if you have low or high blood glucose. Generally, you should have these blood glucose levels:  Before meals (preprandial): 80-130 mg/dL (4.4-7.2 mmol/L).  After meals (postprandial): below 180 mg/dL (10 mmol/L).  Hemoglobin A1c (HbA1c) level: less than 7%. Your health care provider will set treatment goals for you. Keep all follow-up visits. This is important. Follow these instructions at home: Diabetes medicines Take your diabetes medicines every day as told by your health care provider. List your diabetes medicines here:  Name of medicine: ______________________________ ? Amount (dose): _______________ Time (a.m./p.m.): _______________ Notes: ___________________________________  Name of medicine: ______________________________ ? Amount (dose): _______________ Time (a.m./p.m.): _______________ Notes: ___________________________________  Name of medicine:  ______________________________ ? Amount (dose): _______________ Time (a.m./p.m.): _______________ Notes: ___________________________________ Insulin If you use insulin, list the types of insulin you use here:  Insulin type: ______________________________ ? Amount (dose): _______________ Time (a.m./p.m.): _______________Notes: ___________________________________  Insulin type: ______________________________ ? Amount (dose): _______________ Time (a.m./p.m.): _______________ Notes: ___________________________________  Insulin type: ______________________________ ? Amount (dose): _______________ Time (a.m./p.m.): _______________ Notes: ___________________________________  Insulin type: ______________________________ ? Amount (dose): _______________ Time (a.m./p.m.): _______________ Notes: ___________________________________  Insulin type: ______________________________ ? Amount (dose): _______________ Time (a.m./p.m.): _______________ Notes: ___________________________________ Managing blood glucose Check your blood glucose levels using a glucose monitor as told by your health care provider. Write down the times that you check your glucose levels here:  Time: _______________ Notes: ___________________________________  Time: _______________ Notes: ___________________________________  Time: _______________ Notes: ___________________________________  Time: _______________ Notes: ___________________________________  Time: _______________ Notes: ___________________________________  Time: _______________ Notes: ___________________________________   Low blood glucose Low blood glucose (hypoglycemia) is when glucose is at or below 70 mg/dL (3.9 mmol/L). Symptoms may include:  Feeling: ? Hungry. ? Sweaty and clammy. ? Irritable or easily upset. ? Dizzy. ? Sleepy.  Having: ? A fast heartbeat. ? A headache. ? A change in your vision. ? Numbness around the mouth, lips, or  tongue.  Having trouble with: ? Moving (coordination). ? Sleeping. Treating low blood glucose To treat low blood glucose, eat or drink something containing sugar right away. If you can think clearly and swallow safely, follow the 15:15 rule:  Take 15 grams of a fast-acting carb (carbohydrate), as told by your health care provider.  Some fast-acting carbs are: ? Glucose tablets: take 3-4 tablets. ? Hard candy: eat 3-5 pieces. ? Fruit juice: drink 4 oz (120 mL). ? Regular (not diet) soda: drink 4-6 oz (120-180 mL). ? Honey or sugar:   eat 1 Tbsp (15 mL).  Check your blood glucose levels 15 minutes after you take the carb.  If your glucose is still at or below 70 mg/dL (3.9 mmol/L), take 15 grams of a carb again.  If your glucose does not go above 70 mg/dL (3.9 mmol/L) after 3 tries, get help right away.  After your glucose goes back to normal, eat a meal or a snack within 1 hour. Treating very low blood glucose If your glucose is at or below 54 mg/dL (3 mmol/L), you have very low blood glucose (severe hypoglycemia). This is an emergency. Do not wait to see if the symptoms will go away. Get medical help right away. Call your local emergency services (911 in the [place] Do not drive yourself to the hospital. Questions to ask your health care provider  Should I talk with a diabetes educator?  What equipment will I need to care for myself at home?  What diabetes medicines do I need? When should I take them?  How often do I need to check my blood glucose levels?  What number can I call if I have questions?  When is my follow-up visit?  Where can I find a support group for people with diabetes? Where to find more information  American Diabetes Association: www.diabetes.org  Association of Diabetes Care and Education Specialists: www.diabeteseducator.org Contact a health care provider if:  Your blood glucose is at or above 240 mg/dL (13.3 mmol/L) for 2 days in a row.  You have  been sick or have had a fever for 2 days or more, and you are not getting better.  You have any of these problems for more than 6 hours: ? You cannot eat or drink. ? You feel nauseous. ? You vomit. ? You have diarrhea. Get help right away if:  Your blood glucose is lower than 54 mg/dL (3 mmol/L).  You get confused.  You have trouble thinking clearly.  You have trouble breathing. These symptoms may represent a serious problem that is an emergency. Do not wait to see if the symptoms will go away. Get medical help right away. Call your local emergency services (911 in the [place] Do not drive yourself to the hospital. Summary  Diabetes mellitus is a chronic disease that occurs when the body does not properly use sugar (glucose) that is released from food after you eat.  Take insulin and diabetes medicines as told.  Check your blood glucose every day, as often as told.  Keep all follow-up visits. This is important. This information is not intended to replace advice given to you by your health care provider. Make sure you discuss any questions you have with your health care provider. Document Revised: 03/29/2020 Document Reviewed: 03/29/2020 Elsevier Patient Education  2021 [employer]  

## 2021-04-21 ENCOUNTER — Other Ambulatory Visit: Payer: Self-pay | Admitting: *Deleted

## 2021-04-21 DIAGNOSIS — O0990 Supervision of high risk pregnancy, unspecified, unspecified trimester: Secondary | ICD-10-CM

## 2021-04-21 LAB — CERVICOVAGINAL ANCILLARY ONLY
Bacterial Vaginitis (gardnerella): POSITIVE — AB
Candida Glabrata: NEGATIVE
Candida Vaginitis: NEGATIVE
Chlamydia: NEGATIVE
Comment: NEGATIVE
Comment: NEGATIVE
Comment: NEGATIVE
Comment: NEGATIVE
Comment: NEGATIVE
Comment: NORMAL
Neisseria Gonorrhea: NEGATIVE
Trichomonas: NEGATIVE

## 2021-04-25 ENCOUNTER — Other Ambulatory Visit: Payer: Self-pay

## 2021-04-25 DIAGNOSIS — O0990 Supervision of high risk pregnancy, unspecified, unspecified trimester: Secondary | ICD-10-CM

## 2021-04-26 ENCOUNTER — Other Ambulatory Visit: Payer: Self-pay | Admitting: Obstetrics and Gynecology

## 2021-04-26 LAB — CBC
Hematocrit: 34.2 % (ref 34.0–46.6)
Hemoglobin: 10.9 g/dL — ABNORMAL LOW (ref 11.1–15.9)
MCH: 26.7 pg (ref 26.6–33.0)
MCHC: 31.9 g/dL (ref 31.5–35.7)
MCV: 84 fL (ref 79–97)
Platelets: 426 10*3/uL (ref 150–450)
RBC: 4.09 x10E6/uL (ref 3.77–5.28)
RDW: 16.2 % — ABNORMAL HIGH (ref 11.7–15.4)
WBC: 8.4 10*3/uL (ref 3.4–10.8)

## 2021-04-26 LAB — RPR: RPR Ser Ql: NONREACTIVE

## 2021-04-26 LAB — GLUCOSE TOLERANCE, 2 HOURS W/ 1HR
Glucose, 1 hour: 148 mg/dL (ref 65–179)
Glucose, 2 hour: 121 mg/dL (ref 65–152)
Glucose, Fasting: 75 mg/dL (ref 65–91)

## 2021-04-26 LAB — HIV ANTIBODY (ROUTINE TESTING W REFLEX): HIV Screen 4th Generation wRfx: NONREACTIVE

## 2021-04-26 MED ORDER — METRONIDAZOLE 500 MG PO TABS: 500.0000 mg | ORAL_TABLET | Freq: Two times a day (BID) | ORAL | 0 refills | Status: AC

## 2021-05-02 ENCOUNTER — Encounter: Payer: Self-pay | Admitting: Family Medicine

## 2021-05-02 ENCOUNTER — Ambulatory Visit (INDEPENDENT_AMBULATORY_CARE_PROVIDER_SITE_OTHER): Payer: Self-pay | Admitting: Family Medicine

## 2021-05-02 ENCOUNTER — Other Ambulatory Visit: Payer: Self-pay

## 2021-05-02 VITALS — BP 125/86 | HR 91 | Wt 209.5 lb

## 2021-05-02 DIAGNOSIS — K760 Fatty (change of) liver, not elsewhere classified: Secondary | ICD-10-CM

## 2021-05-02 DIAGNOSIS — O0990 Supervision of high risk pregnancy, unspecified, unspecified trimester: Secondary | ICD-10-CM

## 2021-05-02 DIAGNOSIS — O10919 Unspecified pre-existing hypertension complicating pregnancy, unspecified trimester: Secondary | ICD-10-CM

## 2021-05-02 DIAGNOSIS — Z8751 Personal history of pre-term labor: Secondary | ICD-10-CM

## 2021-05-02 DIAGNOSIS — Z8759 Personal history of other complications of pregnancy, childbirth and the puerperium: Secondary | ICD-10-CM

## 2021-05-02 DIAGNOSIS — O34219 Maternal care for unspecified type scar from previous cesarean delivery: Secondary | ICD-10-CM

## 2021-05-02 DIAGNOSIS — R87619 Unspecified abnormal cytological findings in specimens from cervix uteri: Secondary | ICD-10-CM

## 2021-05-02 NOTE — Patient Instructions (Addendum)
Eleccin del mtodo anticonceptivo Contraception Choices Los mtodos anticonceptivos son las cosas que usted hace o [place] para Therapist, occupational. Tambin se los denomina "anticoncepcin". Hay varios mtodos anticonceptivos. Hable con su mdico sobre el mejor mtodo para usted. Mtodos anticonceptivos hormonales Este tipo de mtodo anticonceptivo contiene hormonas. A continuacin se mencionan algunos tipos de mtodos anticonceptivos hormonales:  Un tubo que se coloca debajo de la piel del brazo (implante). El tubo International aid/development worker colocado durante 3 aos como mximo.  Inyecciones que se deben aplicar cada 3 meses.  Pldoras que se deben tomar [employer].  Un parche que se debe cambiar 1 vez por semana durante 3 semanas. Despus de [employer], el parche se debe retirar durante 1semana.  Un anillo que se coloca en la vagina. El anillo se deja colocado durante 3 semanas. Luego, se debe retirar de Theatre stage manager. Despus se coloca un nuevo anillo en la vagina.  Pldoras que se deben tomar despus de tener relaciones sexuales sin proteccin. Estas se denominan pldoras anticonceptivas de emergencia.   Mtodos anticonceptivos de barrera A continuacin se mencionan algunos tipos de mtodos anticonceptivos de barrera:  Una cubierta delgada que se coloca sobre el pene antes de tener sexo (condn masculino). La cubierta se desecha despus de [employer].  Una cubierta blanda y suelta que se coloca en la vagina antes de tener sexo (condn femenino). La cubierta se desecha despus de [employer].  Un dispositivo de goma que se aplica sobre el cuello uterino (diafragma). Este dispositivo debe fabricarse para usted. Se coloca en la vagina antes de tener sexo. Se debe dejar colocado durante 6 a 8horas despus de [employer]. Se debe retirar en un plazo de 24horas.  Un capuchn pequeo y [employer] se fija sobre el cuello uterino (capuchn cervical). Este capuchn debe fabricarse para  usted. El capuchn se debe dejar colocado durante 6 a 8horas despus de Clinical biochemist. Se debe retirar en un plazo de 48horas.  Una esponja que se coloca en la vagina antes de tener sexo. Se debe dejar colocada durante al menos 6horas despus de [employer]. Se debe retirar en un plazo de 30horas y desecharse.  Una sustancia qumica que destruye o impide que los espermatozoides ingresen al tero. Esa sustancia qumica se llama espermicida. Se puede presentar en forma de pldora, crema, gel o espuma y se debe colocar en la vagina. La sustancia qumica se debe usar al [employer] de 10 a 71minutos antes de [employer].   Mtodos anticonceptivos con un DIU DIU significa "dispositivo intrauterino". Se coloca en el interior del tero. Existen dos tipos diferentes:  DIU hormonal. Este tipo puede permanecer colocado en el tero durante 3 a 5 aos.  DIU de cobre. Este tipo International aid/development worker colocado en el tero durante 10 aos. Mtodo anticonceptivo permanente A continuacin se mencionan algunos tipos de mtodos anticonceptivos permanentes:  Cir[place] para obstruir las trompas de [place].  Colocacin de un dispositivo en cada una de las trompas de [place]. Este mtodo funciona al cabo de 3 meses. Se deben usar otros mtodos anticonceptivos durante 3 meses.  [place] para atar los conductos que transportan los espermatozoides (vasectoma). Este mtodo funciona al cabo de 3 meses. Se deben usar otros mtodos anticonceptivos durante 3 meses. Mtodos anticonceptivos por planificacin natural A continuacin se mencionan algunos mtodos anticonceptivos por planificacin natural:  No tener [employer] frtiles de la [place].  Usar un calendario a fin de: ? Hacer un seguimiento de  la duracin de cada ciclo menstrual. ? Determinar en [employer] se podra producir Firefighter. ? Planificar no tener [employer] en que se podra producir Firefighter.  Reconocer los signos de la ovulacin y no  tener relaciones sexuales durante ese perodo. [employer] en que la mujer puede detectar la ovulacin es controlarse la temperatura.  Esperar para tener sexo hasta despus de la ovulacin. Dnde buscar ms informacin  Centers for Disease Control and Prevention (Centros para el Control y Psychiatrist de Event organiser): [email or web address] Resumen  La anticoncepcin, o los mtodos anticonceptivos, hace referencia a las cosas que usted hace o [place] para Location manager.  Los mtodos anticonceptivos hormonales incluyen implantes, inyecciones, pldoras, parches, anillos vaginales y pldoras anticonceptivas de Associate Professor.  Los mtodos anticonceptivos de barrera pueden incluir preservativos masculinos, preservativos femeninos, diafragmas, capuchones cervicales, esponjas y espermicidas.  [employer] tipos diferentes de DIU (dispositivo intrauterino) anticonceptivo. Un DIU puede colocarse en el tero de una mujer para evitar el embarazo durante varios aos.  Un mtodo anticonceptivo [employer] un procedimiento para hombres, mujeres o ambos. La planificacin familiar natural significa no tener sexo cuando la mujer podra quedar embarazada. Esta informacin no tiene Theme park manager el consejo del mdico. Asegrese de hacerle al mdico cualquier pregunta que tenga. Document Revised: 06/28/2020 Document Reviewed: 06/28/2020 Elsevier Patient Education  2021 [employer].

## 2021-05-02 NOTE — Progress Notes (Signed)
   Subjective:  Jennifer Holmes is a 35 y.o. [phone] at [redacted]w[redacted]d being seen today for ongoing prenatal care.  She is currently monitored for the following issues for this high-risk pregnancy and has Hepatic steatosis; Chronic hypertension affecting pregnancy; History of severe pre-eclampsia; Vitamin D deficiency; Supervision of high risk pregnancy, antepartum; Subchorionic hematoma in first trimester; History of preterm delivery; Hx successful VBAC (vaginal birth after cesarean), currently pregnant; History of cesarean delivery; [redacted] weeks gestation of pregnancy; ASCUS of cervix with negative high risk HPV; and Atypical glandular cells of undetermined significance (AGUS) on cervical Pap smear on their problem list.  Patient reports no complaints.  Contractions: Not present. Vag. Bleeding: None.  Movement: Present. Denies leaking of fluid.   The following portions of the patient's history were reviewed and updated as appropriate: allergies, current medications, past family history, past medical history, past social history, past surgical history and problem list. Problem list updated.  Objective:   Vitals:   05/02/21 1423  BP: 125/86  Pulse: 91  Weight: 209 lb 8 oz (95 kg)    Fetal Status: Fetal Heart Rate (bpm): 145   Movement: Present     General:  Alert, oriented and cooperative. Patient is in no acute distress.  Skin: Skin is warm and dry. No rash noted.   Cardiovascular: Normal heart rate noted  Respiratory: Normal respiratory effort, no problems with respiration noted  Abdomen: Soft, gravid, appropriate for gestational age. Pain/Pressure: Present     Pelvic: Vag. Bleeding: None     Cervical exam deferred        Extremities: Normal range of motion.  Edema: None  Mental Status: Normal mood and affect. Normal behavior. Normal judgment and thought content.   Urinalysis:      Assessment and Plan:  Pregnancy: [id number] at [redacted]w[redacted]d  1. Supervision of high risk pregnancy, antepartum BP  normal on labetalol, FHR normal Give tdap next visit Having coccyx pain since fall in 10/2020, XR neg at that time Discussed this usually takes a long time to heal, can repeat imaging after delivery Also discussed contraception at length, plan is for husband to get vasectomy and he has already had a consultation, encouraged her to pick a backup method  2. Chronic hypertension affecting pregnancy Stable on labetalol 200mg  TID Has follow up growth scan scheduled for tomorrow  3. Hepatic steatosis Noted in chart, baseline LFTs normal at beginning of this pregnancy  4. History of preterm delivery At 36wks and 30wks Not on Makena  5. History of severe pre-eclampsia On asa   6. Atypical glandular cells of undetermined significance (AGUS) on cervical Pap smear Pap post partum  7. Hx successful VBAC (vaginal birth after cesarean), currently pregnant X2, desires TOLAC  Preterm labor symptoms and general obstetric precautions including but not limited to vaginal bleeding, contractions, leaking of fluid and fetal movement were reviewed in detail with the patient. Please refer to After Visit Summary for other counseling recommendations.  Return in 2 weeks (on 05/16/2021).   07/16/2021, MD

## 2021-05-03 ENCOUNTER — Ambulatory Visit: Payer: Self-pay | Attending: Obstetrics and Gynecology

## 2021-05-03 ENCOUNTER — Ambulatory Visit: Payer: Self-pay | Admitting: *Deleted

## 2021-05-03 DIAGNOSIS — O99212 Obesity complicating pregnancy, second trimester: Secondary | ICD-10-CM

## 2021-05-03 DIAGNOSIS — E669 Obesity, unspecified: Secondary | ICD-10-CM

## 2021-05-03 DIAGNOSIS — O0990 Supervision of high risk pregnancy, unspecified, unspecified trimester: Secondary | ICD-10-CM

## 2021-05-03 DIAGNOSIS — N879 Dysplasia of cervix uteri, unspecified: Secondary | ICD-10-CM

## 2021-05-03 DIAGNOSIS — O10912 Unspecified pre-existing hypertension complicating pregnancy, second trimester: Secondary | ICD-10-CM | POA: Insufficient documentation

## 2021-05-03 DIAGNOSIS — Z3A27 27 weeks gestation of pregnancy: Secondary | ICD-10-CM

## 2021-05-03 DIAGNOSIS — O3482 Maternal care for other abnormalities of pelvic organs, second trimester: Secondary | ICD-10-CM

## 2021-05-03 DIAGNOSIS — O09212 Supervision of pregnancy with history of pre-term labor, second trimester: Secondary | ICD-10-CM

## 2021-05-03 DIAGNOSIS — O34219 Maternal care for unspecified type scar from previous cesarean delivery: Secondary | ICD-10-CM

## 2021-05-03 DIAGNOSIS — O10012 Pre-existing essential hypertension complicating pregnancy, second trimester: Secondary | ICD-10-CM

## 2021-05-04 ENCOUNTER — Other Ambulatory Visit: Payer: Self-pay | Admitting: Obstetrics

## 2021-05-04 DIAGNOSIS — Z98891 History of uterine scar from previous surgery: Secondary | ICD-10-CM

## 2021-05-04 DIAGNOSIS — O99212 Obesity complicating pregnancy, second trimester: Secondary | ICD-10-CM

## 2021-05-04 DIAGNOSIS — O10012 Pre-existing essential hypertension complicating pregnancy, second trimester: Secondary | ICD-10-CM

## 2021-05-04 DIAGNOSIS — O09899 Supervision of other high risk pregnancies, unspecified trimester: Secondary | ICD-10-CM

## 2021-05-04 DIAGNOSIS — O09213 Supervision of pregnancy with history of pre-term labor, third trimester: Secondary | ICD-10-CM

## 2021-05-16 ENCOUNTER — Ambulatory Visit (INDEPENDENT_AMBULATORY_CARE_PROVIDER_SITE_OTHER): Payer: Self-pay | Admitting: Family Medicine

## 2021-05-16 ENCOUNTER — Other Ambulatory Visit: Payer: Self-pay

## 2021-05-16 ENCOUNTER — Encounter: Payer: Self-pay | Admitting: Family Medicine

## 2021-05-16 VITALS — BP 127/84 | HR 88 | Wt 210.6 lb

## 2021-05-16 DIAGNOSIS — Z23 Encounter for immunization: Secondary | ICD-10-CM

## 2021-05-16 DIAGNOSIS — O34219 Maternal care for unspecified type scar from previous cesarean delivery: Secondary | ICD-10-CM

## 2021-05-16 DIAGNOSIS — Z8759 Personal history of other complications of pregnancy, childbirth and the puerperium: Secondary | ICD-10-CM

## 2021-05-16 DIAGNOSIS — K760 Fatty (change of) liver, not elsewhere classified: Secondary | ICD-10-CM

## 2021-05-16 DIAGNOSIS — R87619 Unspecified abnormal cytological findings in specimens from cervix uteri: Secondary | ICD-10-CM

## 2021-05-16 DIAGNOSIS — Z8751 Personal history of pre-term labor: Secondary | ICD-10-CM

## 2021-05-16 DIAGNOSIS — O10919 Unspecified pre-existing hypertension complicating pregnancy, unspecified trimester: Secondary | ICD-10-CM

## 2021-05-16 DIAGNOSIS — O0990 Supervision of high risk pregnancy, unspecified, unspecified trimester: Secondary | ICD-10-CM

## 2021-05-16 NOTE — Progress Notes (Signed)
   Subjective:  Jennifer Holmes is a 35 y.o. [phone] at [redacted]w[redacted]d being seen today for ongoing prenatal care.  She is currently monitored for the following issues for this high-risk pregnancy and has Hepatic steatosis; Chronic hypertension affecting pregnancy; History of severe pre-eclampsia; Vitamin D deficiency; Supervision of high risk pregnancy, antepartum; Subchorionic hematoma in first trimester; History of preterm delivery; Hx successful VBAC (vaginal birth after cesarean), currently pregnant; History of cesarean delivery; [redacted] weeks gestation of pregnancy; ASCUS of cervix with negative high risk HPV; and Atypical glandular cells of undetermined significance (AGUS) on cervical Pap smear on their problem list.  Patient reports fatigue.  Contractions: Not present. Vag. Bleeding: None.  Movement: Present. Denies leaking of fluid.   The following portions of the patient's history were reviewed and updated as appropriate: allergies, current medications, past family history, past medical history, past social history, past surgical history and problem list. Problem list updated.  Objective:   Vitals:   05/16/21 1547  BP: 127/84  Pulse: 88  Weight: 210 lb 9.6 oz (95.5 kg)    Fetal Status: Fetal Heart Rate (bpm): 150   Movement: Present     General:  Alert, oriented and cooperative. Patient is in no acute distress.  Skin: Skin is warm and dry. No rash noted.   Cardiovascular: Normal heart rate noted  Respiratory: Normal respiratory effort, no problems with respiration noted  Abdomen: Soft, gravid, appropriate for gestational age. Pain/Pressure: Present     Pelvic: Vag. Bleeding: None     Cervical exam deferred        Extremities: Normal range of motion.  Edema: None  Mental Status: Normal mood and affect. Normal behavior. Normal judgment and thought content.   Urinalysis:      Assessment and Plan:  Pregnancy: [id number] at [redacted]w[redacted]d  1. Supervision of high risk pregnancy, antepartum BP  and FHR normal Husband has still not gotten a vasectomy, encouraged to discuss more Received TDaP today  2. Chronic hypertension affecting pregnancy Doing well on labetalol 200 mg TID Following w MFM  3. Hepatic steatosis Normal CMP at beginning of pregnancy  4. Hx successful VBAC (vaginal birth after cesarean), currently pregnant X2, needs VBAC consent next visit  5. History of severe pre-eclampsia On ASA  6. History of preterm delivery At 51 and 30 weeks, not on makena  7. Atypical glandular cells of undetermined significance (AGUS) on cervical Pap smear Re pap post partum  Preterm labor symptoms and general obstetric precautions including but not limited to vaginal bleeding, contractions, leaking of fluid and fetal movement were reviewed in detail with the patient. Please refer to After Visit Summary for other counseling recommendations.  Return in 2 weeks (on 05/30/2021) for [facility], ob visit.   Venora Maples, MD

## 2021-05-16 NOTE — Patient Instructions (Signed)
[email or web address]">  Tercer trimestre de Media planner Third Trimester of Pregnancy  El tercer trimestre de embarazo va desde la semana 28 hasta la semana 40. Esto corresponde a los meses 7 a 9. El tercer trimestre es un perodo en el que el beb en gestacin (feto) crece rpidamente. Hacia el final del noveno mes, el feto mide alrededor de 20pulgadas (45cm) de largo y pesa entre 6 y 54 libras (2.7 y 4.5kg). Cambios en el cuerpo durante el tercer trimestre Durante el tercer trimestre, su cuerpo contina experimentando numerosos cambios. Los cambios varan y generalmente vuelven a la normalidad despus del nacimiento del beb. Cambios fsicos  Seguir [employer]. Es de esperar que aumente entre 25 y 35libras (20 y 16kg) Optometrist final del embarazo si inicia el Media planner con un peso normal. Si tiene bajo [place], es de esperar que aumente entre 28 y 4 libras (13 y18 kg), y si tiene sobrepeso, es de esperar que aumente entre 93 y 25 libras (7 y 28kg).  Podrn aparecer las primeras Apache Corporation caderas, el abdomen y las [place].  Las [employer] National Corporation seguirn creciendo y Tourist information centre manager. Un lquido amarillo Public affairs consultant) puede salir de sus pechos. Esta es la primera leche que usted produce para su beb.  Tal vez haya cambios en el cabello. Esto cambios pueden incluir su engrosamiento, crecimiento rpido y Harley[employer]son textura. A algunas personas tambin se les cae el cabello durante o despus del [place], o tienen el cabello seco o fino.  El ombligo puede salir hacia afuera.  Puede observar que se le Micron Technology, el rostro o los tobillos. Cambios en la salud  Es posible que tenga acidez estomacal.  Puede sufrir estreimiento.  Puede desarrollar hemorroides.  Puede desarrollar venas hinchadas y abultadas (venas varicosas) en las piernas.  Puede presentar ms dolor en la pelvis, la espalda o los muslos. Esto se debe al Southern Company de peso  y al aumento de las hormonas que relajan las articulaciones.  Puede presentar un aumento del hormigueo o entumecimiento en las manos, brazos y piernas. La piel de su abdomen tambin puede sentirse entumecida.  Puede sentir que le falta el aire debido a que se expande el tero. Otros cambios  Puede tener necesidad de Garment/textile technologist con ms frecuencia porque el feto baja hacia la pelvis y ejerce presin sobre la vejiga.  Puede tener ms problemas para dormir. Esto puede deberse al tamao de su abdomen, una mayor necesidad de orinar y un aumento en el metabolismo de su cuerpo.  Puede notar que el feto "baja" o lo siente ms bajo, en el abdomen (aligeramiento).  Puede tener un aumento de la secrecin vaginal.  Puede notar que tiene dolor alrededor del hueso plvico a medida que el tero se distiende. Siga estas instrucciones en su casa: [place] instrucciones del mdico en relacin con el uso de medicamentos. Durante el embarazo, hay medicamentos que pueden tomarse y 81 que no. No tome ningn medicamento a menos que lo haya autorizado el mdico.  Tome vitaminas prenatales que contengan por lo menos [id number] (mcg) de cido flico. Comida y bebida  Lleve una dieta saludable que incluya frutas y verduras frescas, cereales integrales, buenas fuentes de protenas como carnes [place], huevos o tofu, y productos lcteos descremados.  Evite la carne cruda y el [place], la [place] y el queso sin Radio producer. Estos portan grmenes que pueden provocar dao tanto a usted como al beb.  Tome 4 o 5 comidas pequeas en lugar de  3 comidas abundantes al da.  Es posible que tenga que tomar estas medidas para prevenir o tratar el estreimiento: ? Beber suficiente lquido como para mantener la orina de color amarillo plido. ? Consumir alimentos ricos en fibra, como frijoles, cereales integrales, y frutas y verduras frescas. ? Limitar el consumo de alimentos ricos en grasa y azcares procesados,  como los alimentos fritos o dulces. Actividad  Haga ejercicio solamente como se lo haya indicado el mdico. La mayora de las personas pueden continuar su actividad fsica habitual durante el embarazo. Intente realizar como mnimo 30minutos de actividad fsica por lo menos 5das a la semana. Deje de hacer ejercicio si experimenta contracciones en el tero.  Deje de hacer ejercicio si le aparecen dolor o clicos en la parte baja del vientre o de la espalda.  Evite levantar pesos excesivos.  No haga ejercicio si hace mucho calor o humedad, o si se encuentra a una altitud elevada.  Si lo desea, puede seguir teniendo relaciones sexuales, salvo que el mdico le indique lo contrario. Alivio del dolor y del malestar  Haga pausas frecuentes y descanse con las piernas levantadas (elevadas) si tiene calambres en las piernas o dolor en la parte baja de la espalda.  Dese baos de asiento con agua tibia para aliviar el dolor o las molestias causadas por las hemorroides. Use una crema para las hemorroides si el mdico la autoriza.  Use un sujetador que le brinde buen soporte para prevenir las molestias causadas por la sensibilidad en las mamas.  Si tiene venas varicosas: ? Use medias de compresin como se lo haya indicado el mdico. ? Eleve los pies durante 15minutos, 3 o 4veces por da. ? Limite el consumo de sal en su dieta. Seguridad  Hable con su mdico antes de viajar distancias largas.  No se d baos de inmersin en agua caliente, baos turcos ni saunas.  Use el cinturn de seguridad en todo momento mientras conduce o va en auto.  Hable con el mdico si es vctima de maltrato verbal o fsico. Preparacin para el nacimiento Para prepararse para la llegada de su beb:  Tome clases prenatales para entender, practicar, y hacer preguntas sobre el trabajo de parto y el parto.  Visite el hospital y recorra el rea de maternidad.  Compre un asiento de seguridad orientado hacia atrs, y  asegrese de saber cmo instalarlo en su automvil.  Prepare la habitacin o el lugar donde dormir el beb. Asegrese de quitar todas las almohadas y animales de peluche de la cuna del beb para evitar la asfixia. Indicaciones generales  Evite el contacto con las bandejas sanitarias de los gatos y la tierra que estos animales usan. Estos alimentos contienen bacterias que pueden causar defectos congnitos en el beb. Si tiene un gato, pdale a alguien que limpie la caja de arena por usted.  No se haga lavados vaginales ni use tampones. No use toallas higinicas perfumadas.  No consuma ningn producto que contenga nicotina o tabaco, como cigarrillos, cigarrillos electrnicos y tabaco de mascar. Si necesita ayuda para dejar de consumir estos productos, consulte al mdico.  No use ningn remedio a base de hierbas, drogas ilegales o medicamentos que no le hayan sido recetados. Las sustancias qumicas de estos productos pueden daar al beb.  No beba alcohol.  Le realizarn exmenes prenatales ms frecuentes durante el tercer trimestre. Durante una visita prenatal de rutina, el mdico le har un examen fsico, le realizar pruebas y hablar con usted de su salud general. Cumpla   con todas [place]. Esto es importante. Dnde buscar ms informacin  American Pregnancy Association (Asociacin Estadounidense del Embarazo): americanpregnancy.org  SPX Corporation of Obstetricians and Gynecologists (Colegio Estadounidense de Obstetras y [place]): [email or web address]?  Office on [employer] ([place]): [email or web address] Comunquese con un mdico si tiene:  Systems analyst.  Clicos leves en la pelvis, presin en la pelvis o dolor persistente en la zona abdominal o la parte baja de la espalda.  Vmitos o diarrea.  Secrecin vaginal con mal olor u orina con mal olor.  Dolor al [place].  Un dolor de cabeza que no desaparece despus de  Teacher, adult education.  Cambios en la visin o ve manchas delante de los ojos. Solicite ayuda de inmediato si:  Rompe la bolsa.  Tiene contracciones regulares separadas por menos de 17minutos.  Tiene sangrado o pequeas prdidas vaginales.  Siente un dolor abdominal intenso.  Tiene dificultad para respirar.  Siente dolor en el pecho.  Sufre episodios de [employer].  No ha sentido a su beb moverse durante el perodo de Agilent Technologies indic el mdico.  Tiene dolor, hinchazn o enrojecimiento nuevos en un brazo o una pierna o se produce un aumento de alguno de estos sntomas. Resumen  El tercer trimestre del Media planner comprende desde la [id number] hasta la semana 81 (desde el mes7 hasta el mes9).  Puede tener ms problemas para dormir. Esto puede deberse al tamao de su abdomen, una mayor necesidad de orinar y un aumento en el metabolismo de su cuerpo.  Le realizarn exmenes prenatales ms frecuentes durante el tercer trimestre. Cumpla con todas las visitas de seguimiento. Esto es importante. Esta informacin no tiene Marine scientist el consejo del mdico. Asegrese de hacerle al mdico cualquier pregunta que tenga. Document Revised: 06/03/2020 Document Reviewed: 06/03/2020 Elsevier Patient Education  2021 [place] anticonceptivo Contraception Choices La anticoncepcin, o los mtodos anticonceptivos, hace referencia a los mtodos o dispositivos que evitan el [place]. Mtodos hormonales Implante anticonceptivo Un implante anticonceptivo consiste en un tubo delgado de plstico que contiene una hormona que evita el [place]. Es diferente de un dispositivo intrauterino (DIU). Un mdico lo inserta en la parte superior del brazo. Los implantes pueden ser eficaces durante un mximo de 3 aos. Inyecciones de progestina sola Las inyecciones de progestina sola contienen progestina, una forma sinttica de la hormona progesterona. Un mdico las administra cada 3  meses. Pldoras anticonceptivas Las pldoras anticonceptivas son pastillas que contienen hormonas que evitan el [place]. Deben tomarse una vez al da, preferentemente a [place]. Se necesita una receta para utilizar este mtodo anticonceptivo. Parche anticonceptivo El parche anticonceptivo contiene hormonas que evitan el [place]. Se coloca en la piel, debe cambiarse una vez a la semana durante tres semanas y debe retirarse en la cuarta semana. Se necesita una receta para utilizar este mtodo anticonceptivo. Anillo vaginal Un anillo vaginal contiene hormonas que evitan el embarazo. Se coloca en la vagina durante tres semanas y se retira en la cuarta semana. Luego se repite el proceso con un anillo nuevo. Se necesita una receta para utilizar este mtodo anticonceptivo. Anticonceptivo de emergencia Los anticonceptivos de emergencia son mtodos para evitar un embarazo despus de Best boy sexo sin proteccin. Vienen en forma de pldora y pueden tomarse hasta 5 das despus de [place]. Funcionan mejor cuando se toman lo ms pronto posible luego de Merrill Lynch. La mayora de los anticonceptivos de emergencia estn disponibles sin receta mdica. Teachers Insurance and Annuity Association  mtodo no debe utilizarse como el nico mtodo anticonceptivo.   Mtodos de barrera Condn masculino Un condn masculino es una vaina delgada que se coloca sobre el pene durante el sexo. Los condones evitan que el esperma ingrese en el cuerpo de la [place]. Pueden utilizarse con un una sustancia que mata a los espermatozoides (espermicida) para aumentar la efectividad. Deben desecharse despus de un uso. Condn femenino Un condn femenino es una vaina blanda y holgada que se coloca en la vagina antes de [place]. El condn evita que el esperma ingrese en el cuerpo de la [place]. Deben desecharse despus de un uso. Diafragma Un diafragma es una barrera blanda con forma de cpula. Se inserta en la vagina antes del sexo, junto con un espermicida. El  diafragma bloquea el ingreso de esperma en el tero, y el espermicida mata a los espermatozoides. El Designer, television/film set en la vagina durante 6 a 8 horas despus de Best boy sexo y debe retirarse en el plazo de las 24 horas. Un diafragma es recetado y colocado por un mdico. Debe reemplazarse cada 1 a 2 aos, despus de dar a luz, de aumentar ms de 15lb (6.8kg) y de [place] plvica. Capuchn cervical Un capuchn cervical es una copa redonda y blanda de ltex o plstico que se coloca en el cuello uterino. Se inserta en la vagina antes del sexo, junto con un espermicida. Bloquea el ingreso del esperma en el tero. El capuchn Medical illustrator durante 6 a 8 horas despus de Best boy sexo y debe retirarse en el plazo de las 48 horas. Un capuchn cervical debe ser recetado y colocado por un mdico. Debe reemplazarse cada 2aos. Esponja Una esponja es una pieza blanda y circular de espuma de poliuretano que contiene espermicida. La esponja ayuda a bloquear el ingreso de esperma en el tero, y el espermicida mata a los espermatozoides. Marylyn Ishihara, debe humedecerla e insertarla en la vagina. Debe insertarse antes de Merrill Lynch, debe permanecer dentro al menos durante 6 horas despus de tener sexo y debe retirarse y Aeronautical engineer en el plazo de las 30 horas. Espermicidas Los espermicidas son sustancias qumicas que matan o bloquean al esperma y no lo dejan ingresar al cuello uterino y al tero. Vienen en forma de crema, gel, supositorio, espuma o comprimido. Un espermicida debe insertarse en la vagina con un aplicador al menos 10 o 15 minutos antes de tener sexo para dar tiempo a que surta [place]. El proceso debe repetirse cada vez que tenga sexo. Los espermicidas no requieren Furniture conservator/restorer.   Anticonceptivos intrauterinos Dispositivo intrauterino (DIU) Un DIU es un dispositivo en forma de T que se coloca en el tero. Existen dos tipos:  DIU hormonal.Este tipo contiene progestina, una forma  sinttica de la hormona progesterona. Este tipo puede permanecer colocado durante 3 a 5 aos.  DIU de cobre.Este tipo est recubierto con un alambre de cobre. Puede permanecer colocado durante 10 aos. Mtodos anticonceptivos permanentes Ligadura de trompas en la mujer En este mtodo, se sellan, atan u obstruyen las trompas de Falopio durante una ciruga para Product/process development scientist que el vulo descienda [place]. Esterilizacin histeroscpica En este mtodo, se coloca un implante pequeo y flexible dentro de cada trompa de Falopio. Los implantes hacen que se forme un tejido cicatricial en las trompas de Falopio y que las obstruya para que el espermatozoide no pueda llegar al vulo. El procedimiento demora alrededor de 3 meses para que sea [place]. Debe utilizarse otro mtodo anticonceptivo durante  esos 3 meses. Esterilizacin masculina Este es un procedimiento que consiste en atar los conductos que transportan el esperma (vasectoma). Luego del procedimiento, el hombre puede eyacular lquido (semen). Debe utilizarse otro mtodo anticonceptivo durante 3 meses despus del procedimiento. Mtodos de planificacin natural Planificacin familiar natural En este mtodo, la pareja no tiene sexo durante los das en que la mujer podra quedar embarazada. Mtodo calendario En este mtodo, la mujer realiza un seguimiento de la duracin de cada ciclo menstrual, identifica los das en los que se puede producir un embarazo y no tiene sexo durante esos das. Mtodo de la ovulacin En este mtodo, la pareja evita tener sexo durante la ovulacin. Mtodo sintotrmico Este mtodo implica no tener sexo durante la ovulacin. Normalmente, la mujer comprueba la ovulacin al observar cambios en su temperatura y en la consistencia del moco cervical. Mtodo posovulacin En este mtodo, la pareja espera a que finalice la ovulacin para tener sexo. Dnde buscar ms informacin  Centers for Disease Control and Prevention (Centros  para el Control y la Prevencin de Enfermedades): www.cdc.gov Resumen  La anticoncepcin, o los mtodos anticonceptivos, hace referencia a los mtodos o dispositivos que evitan el embarazo.  Los mtodos anticonceptivos hormonales incluyen implantes, inyecciones, pastillas, parches, anillos vaginales y anticonceptivos de emergencia.  Los mtodos anticonceptivos de barrera pueden incluir condones masculinos, condones femeninos, diafragmas, capuchones cervicales, esponjas y espermicidas.  Existen dos tipos de DIU (dispositivo intrauterino). Un DIU puede colocarse en el tero de una mujer para evitar el embarazo durante 3 a 5 aos.  La esterilizacin permanente puede realizarse mediante un procedimiento tanto en los hombres como en las mujeres. Los mtodos de planificacin familiar natural implican no tener sexo durante los das en que la mujer podra quedar embarazada. Esta informacin no tiene como fin reemplazar el consejo del mdico. Asegrese de hacerle al mdico cualquier pregunta que tenga. Document Revised: 06/28/2020 Document Reviewed: 06/28/2020 Elsevier Patient Education  2021 Elsevier Inc.   Lactancia materna Breastfeeding  Decidir amamantar es una de las mejores elecciones que puede hacer por usted y su beb. Un cambio en las hormonas durante el embarazo hace que las mamas produzcan leche materna en las glndulas productoras de leche. Las hormonas impiden que la leche materna sea liberada antes del nacimiento del beb. Adems, impulsan el flujo de leche luego del nacimiento. Una vez que ha comenzado a amamantar, pensar en el beb, as como la succin o el llanto, pueden estimular la liberacin de leche de las glndulas productoras de leche. Los beneficios de amamantar Las investigaciones demuestran que la lactancia materna ofrece muchos beneficios de salud para bebs y madres. Adems, ofrece una forma gratuita y conveniente de alimentar al beb. Para el beb  La primera leche  (calostro) ayuda a mejorar el funcionamiento del aparato digestivo del beb.  Las clulas especiales de la leche (anticuerpos) ayudan a combatir las infecciones en el beb.  Los bebs que se alimentan con leche materna tambin tienen menos probabilidades de tener asma, alergias, obesidad o diabetes de tipo 2. Adems, tienen menor riesgo de sufrir el sndrome de muerte sbita del lactante (SMSL).  Los nutrientes de la leche materna son mejores para satisfacer las necesidades del beb en comparacin con la leche maternizada.  La leche materna mejora el desarrollo cerebral del beb. Para usted  La lactancia materna favorece el desarrollo de un vnculo muy especial entre la madre y el beb.  Es conveniente. La leche materna es econmica y siempre est disponible a la temperatura correcta.    La lactancia materna ayuda a quemar caloras. Claude Manges a perder el peso ganado durante el [place].  Hace que el tero vuelva al tamao que tena antes del embarazo ms rpido. Adems, disminuye el sangrado (loquios) despus del parto.  La lactancia materna contribuye a reducir Nurse, adult de tener diabetes de tipo 2, osteoporosis, artritis reumatoide, enfermedades cardiovasculares y cncer de mama, ovario, tero y endometrio en el futuro. Informacin bsica sobre la lactancia Comienzo de la lactancia  Encuentre un lugar cmodo para sentarse o Teacher, music, con un buen respaldo para el cuello y la espalda.  Coloque una almohada o una manta enrollada debajo del beb para acomodarlo a la altura de la mama (si est sentada). Las almohadas para Museum/gallery exhibitions officer se han diseado especialmente a fin de servir de apoyo para los brazos y el beb [employer].  Asegrese de que la barriga del beb (abdomen) est frente a la suya.  [place]ee suavemente la mama. Con las yemas de los dedos, Liberty Media bordes exteriores de la mama hacia adentro, en direccin al pezn. Esto estimula el flujo de [place]. Si la [employer], es posible que deba Educational psychologist con este movimiento durante la Market researcher.  Sostenga la mama con 4 dedos por debajo y Multimedia programmer por arriba del pezn (forme la letra "C" con la mano). Asegrese de que los dedos se encuentren lejos del pezn y de la boca del beb.  Empuje suavemente los labios del beb con el pezn o con el dedo.  Cuando la boca del beb se abra lo suficiente, acrquelo rpidamente a la mama e introduzca todo el pezn y la arola, tanto como sea posible, dentro de la boca del beb. La arola es la zona de color que rodea al pezn. ? Debe haber ms arola visible por arriba del labio superior del beb que por debajo del labio inferior. ? Los labios del beb deben estar abiertos y extendidos hacia afuera (evertidos) para asegurar que el beb se prenda de forma adecuada y cmoda. ? La lengua del beb debe estar entre la enca inferior y Educational psychologist.  Asegrese de que la boca del beb est en la posicin correcta alrededor del pezn (prendido). Los labios del beb deben crear un sello sobre la mama y estar doblados hacia afuera (invertidos).  Es comn que el beb succione durante 2 a 3 minutos para que comience el flujo de [place]. Cmo debe prenderse Es muy importante que le ensee al beb cmo prenderse adecuadamente a la mama. Si el beb no se prende adecuadamente, puede causar [employer] Stores, reducir la produccin de [place] materna y Radio producer que el beb tenga un escaso aumento de [place]. Adems, si el beb no se prende adecuadamente al pezn, puede tragar aire durante la alimentacin. Esto puede causarle molestias al beb. Hacer eructar al beb al [place] de mama puede ayudarlo a liberar el aire. Sin embargo, ensearle al beb cmo prenderse a la mama adecuadamente es la mejor manera de evitar que se sienta molesto por tragar Oceanographer se alimenta. Signos de que el beb se ha prendido adecuadamente al pezn  Tironea o succiona de modo silencioso, sin Publishing rights manager.  Los labios del beb deben estar extendidos hacia afuera (evertidos).  Se escucha que traga cada 3 o 4 succiones una vez que la [employer] ha comenzado a Radiographer, therapeutic (despus de que se produzca el reflejo de eyeccin de la [place]).  Hay movimientos musculares por arriba y por delante de sus odos al Printmaker.  Signos de que el beb no se ha prendido Audiological scientist al pezn  Hace ruidos de succin o de chasquido mientras se Tree surgeon.  Siente dolor en los pezones. Si cree que el beb no se prendi correctamente, deslice el dedo en la comisura de la boca y A[employer]ren Corporation las encas del beb para interrumpir la succin. Intente volver a comenzar a Museum/gallery exhibitions officer. Signos de Market researcher materna exitosa Signos del beb  El beb disminuir gradualmente el nmero de succiones o dejar de succionar por completo.  El beb se quedar dormido.  El cuerpo del beb se relajar.  El beb retendr Neomia Dear pequea cantidad de [facility] boca.  El beb se desprender solo del [place]. Signos que presenta usted  Las mamas han aumentado la firmeza, el peso y el tamao 1 a 3 horas despus de Museum/gallery exhibitions officer.  Estn ms blandas inmediatamente despus de amamantar.  Se producen un aumento del volumen de [place] y un cambio en su consistencia y color hacia el quinto da de Market researcher.  Los pezones no duelen, no estn agrietados ni sangran. Signos de que su beb recibe la cantidad de leche suficiente  Mojar por lo menos 1 o 2paales durante las primeras 24horas despus del nacimiento.  Mojar por lo menos 5 o 6paales cada 24horas durante la primera semana despus del nacimiento. La orina debe ser clara o de color amarillo plido a los 5das de vida.  Mojar entre 6 y 8paales cada 24horas a medida que el beb sigue creciendo y desarrollndose.  Defeca por lo menos 3 veces en 24 horas a los 5 [address] de [address] Las heces deben ser blandas y Armed forces operational officer.  Defeca por lo menos 3 veces en 24 horas a los [address] de [address] Las heces deben  ser grumosas y Armed forces operational officer.  No registra una prdida de peso mayor al 10% del peso al nacer durante los primeros 3 [address]  [address] de [place].  Aumenta de peso un promedio de 4 a 7onzas (113 a 198g) por semana despus de los 4 [address] de vida.  Aumenta de [place], [place], de [place] uniforme a Glass blower/designer de los 5 [address] de vida, sin Passenger transport manager prdida de peso despus de las 2semanas de vida. Despus de alimentarse, es posible que el beb regurgite una pequea cantidad de [place]. Esto es normal. Frecuencia y duracin de la lactancia El amamantamiento frecuente la ayudar a producir ms [place] y puede prevenir dolores en los pezones y las mamas extremadamente llenas (congestin [place]). Alimente al beb cuando muestre signos de hambre o si siente la necesidad de reducir la congestin de las [place]. Esto se denomina "lactancia a demanda". Las seales de que el beb tiene hambre incluyen las siguientes:  Aumento del [place], [place] o inquietud.  Mueve la cabeza de un lado a otro.  Abre la boca cuando se le toca la mejilla o la comisura de la boca (reflejo de bsqueda).  Aumenta las vocalizaciones, tales como sonidos de succin, se relame los labios, emite arrullos, suspiros o chirridos.  Mueve la [employer] boca y se chupa los dedos o las manos.  Est molesto o llora. Evite el uso del chupete en las primeras 4 a 6 semanas despus del nacimiento del beb. Despus de este perodo, podr usar un chupete. Las investigaciones demostraron que el uso del chupete durante Financial risk analyst ao de vida del beb disminuye el riesgo de tener el sndrome de muerte sbita del lactante (SMSL). Permita que el nio se alimente en cada mama todo lo que  desee. Cuando el beb se desprende o se queda dormido mientras se est alimentando de la primera mama, ofrzcale la segunda. Debido a que, con frecuencia, los recin nacidos estn somnolientos las primeras semanas de vida, es posible que deba despertar al beb para  alimentarlo. Los horarios de Acupuncturist de un beb a otro. Sin embargo, las siguientes reglas pueden servir como gua para ayudarla a Lawyer que el beb se alimenta adecuadamente:  Se puede amamantar a los recin nacidos (bebs de 4 semanas o menos de vida) cada 1 a 3 horas.  No deben transcurrir ms de 3 horas durante el da o 5 horas durante la noche sin que se amamante a los recin nacidos.  Debe amamantar al beb un mnimo de 8 veces en un perodo de 24 horas. Extraccin de [employer] extraccin y Contractor de la leche materna le permiten asegurarse de que el beb se alimente exclusivamente de su leche materna, aun en momentos en los que no puede Museum/gallery exhibitions officer. Esto tiene especial importancia si debe regresar al Aleen Campi en el perodo en que an est amamantando o si no puede estar presente en los momentos en que el beb debe alimentarse. Su asesor en lactancia puede ayudarla a Clinical research associate un mtodo de extraccin que funcione mejor para usted y Programmer, systems cunto tiempo es seguro almacenar [place].      Cmo cuidar las mamas durante la lactancia Los pezones pueden secarse, Lobbyist y doler durante la Market researcher. Las siguientes recomendaciones pueden ayudarla a Pharmacologist las [employer] y sanas:  Careers information officer usar jabn en los pezones.  Use un sostn de soporte diseado especialmente para la lactancia materna. Evite usar sostenes con aro o sostenes muy ajustados (sostenes deportivos).  Seque al aire sus pezones durante 3 a despus de amamantar al beb.  Utilice solo apsitos de Haematologist sostn para Environmental health practitioner las prdidas de [place]. La prdida de un poco de [employer] tomas es normal.  Utilice lanolina sobre los pezones luego de Museum/gallery exhibitions officer. La lanolina ayuda a mantener la humedad normal de la piel. La lanolina pura no es perjudicial (no es txica) para el beb. Adems, puede extraer [employer] algunas gotas de [place] materna y Engineer, maintenance (IT)  suavemente esa [employer] pezones para que la [place] se seque al aire. Durante las primeras semanas despus del nacimiento, algunas mujeres experimentan [place]. La congestin [employer] [place] [employer] puede hacer que sienta las mamas pesadas, calientes y sensibles al tacto. El pico de la congestin mamaria ocurre en el plazo de los 3 a 5 das despus del [place]. Las siguientes recomendaciones pueden ayudarla a Paramedic la congestin mamaria:  Vace por completo las mamas al [employer] o Environmental health practitioner. Puede aplicar calor hmedo en las mamas (en la ducha o con toallas hmedas para manos) antes de Museum/gallery exhibitions officer o extraer [employer]. Esto aumenta la circulacin y [place] a que la [place]. Si el beb no vaca por completo las [address] cuando lo [address], extraiga la [place] restante despus de que haya finalizado.  Aplique compresas de hielo [employer] inmediatamente despus de Museum/gallery exhibitions officer o extraer [place], a menos que le resulte demasiado incmodo. Haga lo siguiente: ? Ponga el hielo en una bolsa plstica. ? Coloque una First[employer] piel y la bolsa de hielo. ? Coloque el hielo durante , 2 o 3veces por da.  Asegrese de que el beb est prendido y se encuentre en la posicin correcta mientras lo alimenta. Si la congestin mamaria persiste luego de 48  horas o despus de seguir estas recomendaciones, comunquese con su mdico o un asesor en lactancia. Recomendaciones de salud general durante la lactancia  Consuma 3 comidas y 3 colaciones saludables todos los [place]. Las [employer] Holdings bien alimentadas que amamantan necesitan entre 450 y 500 caloras adicionales por Futures trader. Puede cumplir con este requisito al aumentar la cantidad de una dieta equilibrada que realice.  Beba suficiente agua para mantener la orina clara o de color amarillo plido.  Descanse con frecuencia, reljese y siga tomando sus vitaminas prenatales para prevenir la fatiga, el estrs y los niveles bajos de vitaminas y [employer] en el cuerpo  (deficiencias de nutrientes).  No consuma ningn producto que contenga nicotina o tabaco, como cigarrillos y Administrator, Civil Service. El beb puede verse afectado por las sustancias qumicas de los cigarrillos que pasan a la [place] materna y por la exposicin al humo ambiental del tabaco. Si necesita ayuda para dejar de fumar, consulte al mdico.  Evite el consumo de alcohol.  No consuma drogas ilegales o marihuana.  Antes de Dietitian, hable con el mdico. Estos incluyen medicamentos recetados y de [place], como tambin vitaminas y suplementos a base de hierbas. Algunos medicamentos, que pueden ser perjudiciales para el beb, pueden pasar a travs de la [employer].  Puede quedar embarazada durante la lactancia. Si se desea un mtodo anticonceptivo, consulte al mdico sobre cules son las opciones seguras durante la Market researcher. Dnde encontrar ms informacin: Liga internacional [place]: [email or web address] Comunquese con un mdico si:  Siente que quiere dejar de Museum/gallery exhibitions officer o se siente frustrada con la lactancia.  Sus pezones estn agrietados o Water quality scientist.  Sus mamas estn irritadas, sensibles o calientes.  Tiene los siguientes sntomas: ? Dolor en las mamas o en los pezones. ? Un rea hinchada en cualquiera de las mamas. ? [place] Ruts o escalofros. ? Nuseas o vmitos. ? Drenaje de otro lquido distinto de la [employer] materna desde los pezones.  Sus mamas no se llenan antes de Museum/gallery exhibitions officer al beb para el quinto da despus del [place].  Se siente triste y deprimida.  El beb: ? Est demasiado somnoliento como para comer bien. ? Tiene problemas para dormir. ? Tiene ms de 1 semana de vida y [employer] de 6 paales en un periodo de 24 horas. ? No ha aumentado de [place] a los [address] de [address]  El beb defeca menos de 3 veces en 24 horas.  La piel del beb o las partes blancas de los ojos se vuelven amarillentas. Solicite ayuda de inmediato si:  El beb est muy cansado  Retail buyer) y no se quiere despertar para comer.  Le sube la fiebre sin causa. Resumen  La lactancia materna ofrece muchos beneficios de salud para bebs y [place].  Intente amamantar a su beb cuando muestre signos tempranos de hambre.  Haga cosquillas o empuje suavemente los labios del beb con el dedo o el pezn para lograr que el beb abra la boca. Acerque el beb a la mama. Asegrese de que la mayor parte de la arola se encuentre dentro de la boca del beb. Ofrzcale una mama y haga eructar al beb antes de pasar a la otra.  Hable con su mdico o asesor en lactancia si tiene dudas o problemas con la lactancia. Esta informacin no tiene Theme park manager el consejo del mdico. Asegrese de hacerle al mdico cualquier pregunta que tenga. Document Revised: 02/20/2018 Document Reviewed: 03/18/2017 Elsevier Patient Education  2021 [employer].

## 2021-06-01 ENCOUNTER — Ambulatory Visit: Payer: Self-pay | Attending: Obstetrics

## 2021-06-01 ENCOUNTER — Ambulatory Visit (INDEPENDENT_AMBULATORY_CARE_PROVIDER_SITE_OTHER): Payer: Self-pay | Admitting: Obstetrics & Gynecology

## 2021-06-01 ENCOUNTER — Ambulatory Visit: Payer: Self-pay | Admitting: *Deleted

## 2021-06-01 ENCOUNTER — Other Ambulatory Visit: Payer: Self-pay

## 2021-06-01 ENCOUNTER — Encounter: Payer: Self-pay | Admitting: *Deleted

## 2021-06-01 VITALS — BP 141/95 | HR 85 | Wt 210.6 lb

## 2021-06-01 VITALS — BP 123/78 | HR 74

## 2021-06-01 DIAGNOSIS — O09899 Supervision of other high risk pregnancies, unspecified trimester: Secondary | ICD-10-CM | POA: Insufficient documentation

## 2021-06-01 DIAGNOSIS — Z8759 Personal history of other complications of pregnancy, childbirth and the puerperium: Secondary | ICD-10-CM

## 2021-06-01 DIAGNOSIS — O0990 Supervision of high risk pregnancy, unspecified, unspecified trimester: Secondary | ICD-10-CM

## 2021-06-01 DIAGNOSIS — Z98891 History of uterine scar from previous surgery: Secondary | ICD-10-CM

## 2021-06-01 DIAGNOSIS — O10919 Unspecified pre-existing hypertension complicating pregnancy, unspecified trimester: Secondary | ICD-10-CM

## 2021-06-01 DIAGNOSIS — O99212 Obesity complicating pregnancy, second trimester: Secondary | ICD-10-CM | POA: Insufficient documentation

## 2021-06-01 DIAGNOSIS — O09213 Supervision of pregnancy with history of pre-term labor, third trimester: Secondary | ICD-10-CM | POA: Insufficient documentation

## 2021-06-01 DIAGNOSIS — O34219 Maternal care for unspecified type scar from previous cesarean delivery: Secondary | ICD-10-CM

## 2021-06-01 DIAGNOSIS — O10012 Pre-existing essential hypertension complicating pregnancy, second trimester: Secondary | ICD-10-CM | POA: Insufficient documentation

## 2021-06-01 NOTE — Progress Notes (Signed)
Pt states having nausea, elevated BP today, having contractions that are bothersome since yesterday. Has Hx of PTD.

## 2021-06-01 NOTE — Patient Instructions (Signed)
Hipertensin durante el embarazo Hypertension During Pregnancy La presin arterial alta (hipertensin) se produce cuando la fuerza de la sangre que circula por las arterias es lo suficientemente alta para causarle problemas de [place]. Las arterias son vasos sanguneos que transportan la sangre desde el corazn al resto del cuerpo. La hipertensin durante el embarazo puede causar problemas para usted y el beb.Puede ser leve o grave. Durante el [place], se pueden presentar diferentes tipos de hipertensin. Estos incluyen: Hipertensin crnica. Esto sucede cuando se ha tenido presin arterial alta antes de quedar embarazada y contina durante el [place]. La hipertensin que aparece antes de las 20 semanas de [place] y contina durante el embarazo tambin se denomina hipertensin crnica. Si tiene hipertensin crnica, no desaparecer despus de haber tenido el beb. Deber concurrir a visitas de seguimiento con el mdico despus de haber tenido el beb. El mdico podr indicarle que siga recibiendo medicamentos para la presin arterial. Hipertensin gestacional. Esta hipertensin se desarrolla despus de la semana 20 de embarazo. La hipertensin gestacional por lo general desaparece despus de tener el beb, pero el mdico deber controlar la presin arterial para asegurarse de que est mejorando. Hipertensin posparto. Se trata de una presin arterial alta que estaba presente antes del parto y [place] despus de [place], o que comienza despus del [place]. Esto normalmente ocurre dentro de las 48 horas despus del nacimiento del beb, pero puede aparecer hasta 6 semanas despus de dar a luz. Cuando la hipertensin durante el embarazo es grave, representa una urgenciamdica que requiere tratamiento de inmediato. Jill Alexanders modo me afecta? Las mujeres que sufren de hipertensin durante el embarazo tienen una mayor probabilidad de Environmental education officer hipertensin en etapas posteriores de la vida o en embarazos futuros. En  algunos casos, la hipertensin durante el embarazo puede causar complicaciones graves, como: Accidente cerebrovascular. Infarto de miocardio. Lesiones en otros rganos, como riones, pulmones o hgado. Preeclampsia. Una afeccin llamada sndrome de hemlisis, elevacin de las enzimas hepticas y recuento bajo de plaquetas (HELLP). Convulsiones. Desprendimiento de la placenta. Cmo afecta esto al beb? La hipertensin durante el embarazo puede afectar al beb. El beb puede: Nacer en forma temprana (prematuramente). No pesar tanto como debera al nacer (bajo peso al nacer). No tolerar bien el trabajo de [place], lo que deriva en un parto por cesrea no planeado. Esta afeccin tambin puede provocar la muerte del beb antes del nacimiento (muerte fetal). Cules son los riesgos? Existen ciertos factores que [place] las probabilidades de que desarrolle hipertensin durante el [place]. Estos incluyen: Tener hipertensin en un embarazo previo o antecedentes familiares de hipertensin. Tener sobrepeso. Tener 35 aos o ms. Estar embarazada por primera vez. Estar embarazada de ms de un beb. Embarazarse a travs de mtodos de fertilizacin como FIV (fertilizacin in vitro). Tener otros problemas mdicos, como diabetes, enfermedad renal o lupus. Qu puedo hacer para reducir mi riesgo? Se desconoce la causa exacta de la hipertensin durante el embarazo. Es posible que pueda reducir su riesgo al hacer lo siguiente: Pharmacologist un peso saludable. Consumir una dieta saludable y equilibrada. Seguir las indicaciones del mdico acerca de Production designer, theatre/television/film afeccin a largo plazo que haya tenido antes de quedar embarazada. Es muy importante que concurra a todas las visitas de cuidado prenatal. El mdico Surveyor, mining la presin arterial y se asegurar de que el embarazo est progresando segn lo previsto. Si se detecta un problema, el tratamientotemprano puede evitar complicaciones. Cmo se trata? El  tratamiento para la hipertensin durante el embarazo difiere segn el tipo de hipertensin  que tiene y de su gravedad. Si estuvo recibiendo medicamentos para la presin arterial alta antes de quedar embarazada, hable con el mdico. Es posible que deba cambiar los medicamentos durante el embarazo porque algunos medicamentos, como los inhibidores de la enzima convertidora de angiotensina (IECA), pueden no ser considerados seguros para el beb. Si tiene hipertensin gestacional, el mdico puede indicarle medicamentos para tratar Engineer, site. Si est en riesgo de padecer preeclampsia, su mdico puede recomendarle que tome una dosis baja de aspirina durante el [place]. Si tiene hipertensin grave, es posible que deba hospitalizarse para que usted y el beb puedan ser controlados atentamente. Es posible que deba recibir un medicamento para disminuir la presin arterial. En ciertos casos, si su afeccin empeora, es posible que necesite dar a luz de forma ms temprana. Siga estas instrucciones en su casa: Comida y bebida  Beber suficiente lquido como para Pharmacologist la orina de color amarillo plido. Evite la cafena.  Estilo de vida No consuma ningn producto que contenga nicotina o tabaco. Estos productos incluyen cigarrillos, tabaco para Theatre manager y aparatos de vapeo, como los Administrator, Civil Service. Si necesita ayuda para dejar de fumar, consulte al mdico. No consuma drogas ni alcohol. Evite las situaciones estresantes tanto como sea posible. Haga reposo y [place]. El ejercicio regular puede ayudar a reducir la presin arterial. Consulte al mdico qu tipos de ejercicios son seguros para usted. Instrucciones generales Use los medicamentos de venta libre y los recetados solamente como se lo haya indicado el mdico. Concurra a todas las visitas prenatales y de seguimiento. Esto es importante. Comunquese con un mdico si: Tiene sntomas que su mdico le ha informado que pueden  requerir ms tratamiento o control, por ejemplo: Dolores de [place]. Nuseas o vmitos. Dolor abdominal. Mareos. Desvanecimiento. Solicite ayuda de inmediato si: Tiene sntomas de complicaciones graves, como: Dolor abdominal intenso que no mejora con Scientist, research (medical). Dolor de cabeza intenso que no mejora, visin borrosa o visin doble. Vmitos que no mejoran. Aumento repentino y rpido de peso o hinchazn de las manos, los tobillos o la cara. Sangrado vaginal. Sangre en la [place]. Siente que le falta el aire o dolor en el pecho. Debilidad en un lado del cuerpo o dificultad para hablar. El beb no se mueve tanto como sera usual. Estos sntomas pueden representar un problema grave que constituye Radio broadcast assistant. No espere a ver si los sntomas desaparecen. Solicite atencin mdica de inmediato. Comunquese con el servicio de emergencias de su localidad (911 en los [place]). No conduzca por sus propios medios [employer]. Resumen La hipertensin durante el embarazo puede causar problemas para usted y el beb. El tratamiento para la hipertensin durante el embarazo difiere segn el tipo de hipertensin que tiene y de su gravedad. Concurra a todas las visitas prenatales y de seguimiento. Esto es importante. Busque ayuda de inmediato si tiene sntomas de complicaciones graves debido a la presin arterial alta. Esta informacin no tiene Theme park manager el consejo del mdico. Asegresede hacerle al mdico cualquier pregunta que tenga. Document Revised: 10/03/2020 Document Reviewed: 10/03/2020 Elsevier Patient Education  2022 [employer].

## 2021-06-01 NOTE — Progress Notes (Signed)
   PRENATAL VISIT NOTE  Subjective:  Jennifer Holmes is a 35 y.o. [phone] at [redacted]w[redacted]d being seen today for ongoing prenatal care.  She is currently monitored for the following issues for this high-risk pregnancy and has Hepatic steatosis; Chronic hypertension affecting pregnancy; History of severe pre-eclampsia; Vitamin D deficiency; Supervision of high risk pregnancy, antepartum; Subchorionic hematoma in first trimester; History of preterm delivery; Hx successful VBAC (vaginal birth after cesarean), currently pregnant; History of cesarean delivery; [redacted] weeks gestation of pregnancy; ASCUS of cervix with negative high risk HPV; and Atypical glandular cells of undetermined significance (AGUS) on cervical Pap smear on their problem list.  Patient reports occasional contractions.  Contractions: Irritability. Vag. Bleeding: None.  Movement: Present. Denies leaking of fluid.   The following portions of the patient's history were reviewed and updated as appropriate: allergies, current medications, past family history, past medical history, past social history, past surgical history and problem list.   Objective:   Vitals:   06/01/21 1418  BP: (!) 141/95  Pulse: 85  Weight: 210 lb 9.6 oz (95.5 kg)    Fetal Status: Fetal Heart Rate (bpm): 140   Movement: Present     General:  Alert, oriented and cooperative. Patient is in no acute distress.  Skin: Skin is warm and dry. No rash noted.   Cardiovascular: Normal heart rate noted  Respiratory: Normal respiratory effort, no problems with respiration noted  Abdomen: Soft, gravid, appropriate for gestational age.  Pain/Pressure: Present     Pelvic: Cervical exam performed in the presence of a chaperone        Extremities: Normal range of motion.  Edema: None  Mental Status: Normal mood and affect. Normal behavior. Normal judgment and thought content.   Assessment and Plan:  Pregnancy: [id number] at [redacted]w[redacted]d 1. Supervision of high risk pregnancy,  antepartum [place]  today  2. Hx successful VBAC (vaginal birth after cesarean), currently pregnant Signed TOLAC consent after counseling  3. History of cesarean delivery TOLAC planned  4. History of severe pre-eclampsia CHTN on labetalol  Preterm labor symptoms and general obstetric precautions including but not limited to vaginal bleeding, contractions, leaking of fluid and fetal movement were reviewed in detail with the patient. Please refer to After Visit Summary for other counseling recommendations.   Return in about 2 weeks (around 06/15/2021).  Future Appointments  Date Time Provider Department Center  06/01/2021  3:45 PM [facility] NURSE [facility]  06/01/2021  4:00 PM [facility] US1 [facility]  06/07/2021 10:15 AM [facility] NST [facility]  06/14/2021 10:15 AM [facility] NST [facility]    Scheryl Darter, MD

## 2021-06-02 ENCOUNTER — Other Ambulatory Visit: Payer: Self-pay | Admitting: *Deleted

## 2021-06-02 DIAGNOSIS — O10913 Unspecified pre-existing hypertension complicating pregnancy, third trimester: Secondary | ICD-10-CM

## 2021-06-07 ENCOUNTER — Ambulatory Visit (INDEPENDENT_AMBULATORY_CARE_PROVIDER_SITE_OTHER): Payer: Self-pay

## 2021-06-07 ENCOUNTER — Other Ambulatory Visit: Payer: Self-pay

## 2021-06-07 ENCOUNTER — Ambulatory Visit (INDEPENDENT_AMBULATORY_CARE_PROVIDER_SITE_OTHER): Payer: Self-pay | Admitting: General Practice

## 2021-06-07 VITALS — BP 150/88 | HR 85

## 2021-06-07 DIAGNOSIS — O10919 Unspecified pre-existing hypertension complicating pregnancy, unspecified trimester: Secondary | ICD-10-CM

## 2021-06-07 DIAGNOSIS — O0990 Supervision of high risk pregnancy, unspecified, unspecified trimester: Secondary | ICD-10-CM

## 2021-06-07 DIAGNOSIS — O26893 Other specified pregnancy related conditions, third trimester: Secondary | ICD-10-CM

## 2021-06-07 DIAGNOSIS — R12 Heartburn: Secondary | ICD-10-CM

## 2021-06-07 DIAGNOSIS — R300 Dysuria: Secondary | ICD-10-CM

## 2021-06-07 LAB — POCT URINALYSIS DIP (DEVICE)
Bilirubin Urine: NEGATIVE
Glucose, UA: NEGATIVE mg/dL
Ketones, ur: NEGATIVE mg/dL
Leukocytes,Ua: NEGATIVE
Nitrite: NEGATIVE
Protein, ur: NEGATIVE mg/dL
Specific Gravity, Urine: 1.015 (ref 1.005–1.030)
Urobilinogen, UA: 0.2 mg/dL (ref 0.0–1.0)
pH: 7 (ref 5.0–8.0)

## 2021-06-07 MED ORDER — NITROFURANTOIN MONOHYD MACRO 100 MG PO CAPS: 100.0000 mg | ORAL_CAPSULE | Freq: Two times a day (BID) | ORAL | 0 refills | Status: DC

## 2021-06-07 MED ORDER — OMEPRAZOLE 20 MG PO CPDR
20.0000 mg | DELAYED_RELEASE_CAPSULE | Freq: Every day | ORAL | 0 refills | Status: DC
Start: 2021-06-07 — End: 2021-07-23

## 2021-06-07 NOTE — Progress Notes (Signed)
Pt informed that the ultrasound is considered a limited OB ultrasound and is not intended to be a complete ultrasound exam.  Patient also informed that the ultrasound is not being completed with the intent of assessing for fetal or placental anomalies or any pelvic abnormalities.  Explained that the purpose of today's ultrasound is to assess for  BPP, presentation, and AFI.  Patient acknowledges the purpose of the exam and the limitations of the study.     Patient reports dysuria for the past week & would like urine checked. Patient denies bleeding or discharge. UA reveals trace blood. Per Dr Shawnie Pons, urine should be sent for culture and antibiotic sent in per protocol. Patient informed & Rx for Macrobid sent to pharmacy. Patient reports heartburn and is taking Tums but it helps minimally. Rx for Prilosec sent per protocol. Patient's BP in office today 148/100 & 150/88. Patient states she hasn't taken her medication this morning but she was in a rush. Advised patient to go home and take BP medication then check her BP 2 hours later. Instructed patient to call us if BP remains high. Patient verbalized understanding.  Chase Caller RN BSN 06/07/21

## 2021-06-07 NOTE — Progress Notes (Signed)
Patient seen and assessed by nursing staff.  Agree with documentation and plan.  NST:  Baseline: 135 bpm, Variability: Good {> 6 bpm), Accelerations: Reactive, and Decelerations: Absent   

## 2021-06-08 ENCOUNTER — Inpatient Hospital Stay (HOSPITAL_COMMUNITY)
Admission: AD | Admit: 2021-06-08 | Discharge: 2021-06-08 | Disposition: A | Discharge status: Home / Self Care | Payer: Self-pay | Attending: Obstetrics & Gynecology | Admitting: Obstetrics & Gynecology

## 2021-06-08 ENCOUNTER — Encounter (HOSPITAL_COMMUNITY): Payer: Self-pay | Admitting: Obstetrics & Gynecology

## 2021-06-08 DIAGNOSIS — O10013 Pre-existing essential hypertension complicating pregnancy, third trimester: Secondary | ICD-10-CM

## 2021-06-08 DIAGNOSIS — R519 Headache, unspecified: Secondary | ICD-10-CM | POA: Insufficient documentation

## 2021-06-08 DIAGNOSIS — Z3A32 32 weeks gestation of pregnancy: Secondary | ICD-10-CM | POA: Insufficient documentation

## 2021-06-08 DIAGNOSIS — O09213 Supervision of pregnancy with history of pre-term labor, third trimester: Secondary | ICD-10-CM

## 2021-06-08 DIAGNOSIS — O26893 Other specified pregnancy related conditions, third trimester: Secondary | ICD-10-CM | POA: Insufficient documentation

## 2021-06-08 DIAGNOSIS — Z79899 Other long term (current) drug therapy: Secondary | ICD-10-CM | POA: Insufficient documentation

## 2021-06-08 DIAGNOSIS — Z7982 Long term (current) use of aspirin: Secondary | ICD-10-CM | POA: Insufficient documentation

## 2021-06-08 DIAGNOSIS — O34219 Maternal care for unspecified type scar from previous cesarean delivery: Secondary | ICD-10-CM

## 2021-06-08 LAB — CBC
HCT: 29.9 % — ABNORMAL LOW (ref 36.0–46.0)
Hemoglobin: 9.7 g/dL — ABNORMAL LOW (ref 12.0–15.0)
MCH: 25.7 pg — ABNORMAL LOW (ref 26.0–34.0)
MCHC: 32.4 g/dL (ref 30.0–36.0)
MCV: 79.1 fL — ABNORMAL LOW (ref 80.0–100.0)
Platelets: 384 10*3/uL (ref 150–400)
RBC: 3.78 MIL/uL — ABNORMAL LOW (ref 3.87–5.11)
RDW: 16.7 % — ABNORMAL HIGH (ref 11.5–15.5)
WBC: 6.9 10*3/uL (ref 4.0–10.5)
nRBC: 0.4 % — ABNORMAL HIGH (ref 0.0–0.2)

## 2021-06-08 LAB — COMPREHENSIVE METABOLIC PANEL
ALT: 16 U/L (ref 0–44)
AST: 23 U/L (ref 15–41)
Albumin: 2.6 g/dL — ABNORMAL LOW (ref 3.5–5.0)
Alkaline Phosphatase: 105 U/L (ref 38–126)
Anion gap: 7 (ref 5–15)
BUN: <5 mg/dL — ABNORMAL LOW (ref 6–20)
CO2: 21 mmol/L — ABNORMAL LOW (ref 22–32)
Calcium: 7.9 mg/dL — ABNORMAL LOW (ref 8.9–10.3)
Chloride: 110 mmol/L (ref 98–111)
Creatinine, Ser: 0.49 mg/dL (ref 0.44–1.00)
GFR, Estimated: >60 mL/min (ref >60)
Glucose, Bld: 114 mg/dL — ABNORMAL HIGH (ref 70–99)
Potassium: 3.5 mmol/L (ref 3.5–5.1)
Sodium: 138 mmol/L (ref 135–145)
Total Bilirubin: 0.3 mg/dL (ref 0.3–1.2)
Total Protein: 5.9 g/dL — ABNORMAL LOW (ref 6.5–8.1)

## 2021-06-08 LAB — PROTEIN / CREATININE RATIO, URINE
Creatinine, Urine: 116.23 mg/dL
Protein Creatinine Ratio: 0.21 mg/mg{Cre} — ABNORMAL HIGH (ref 0.00–0.15)
Total Protein, Urine: 24 mg/dL

## 2021-06-08 MED ORDER — ACETAMINOPHEN 500 MG PO TABS
1000.0000 mg | ORAL_TABLET | Freq: Once | ORAL | Status: AC
  Administered 2021-06-08: 1000 mg via ORAL
  Filled 2021-06-08: qty 2

## 2021-06-08 MED ORDER — LACTATED RINGERS IV BOLUS
1000.0000 mL | Freq: Once | INTRAVENOUS | Status: AC
  Administered 2021-06-08: 1000 mL via INTRAVENOUS

## 2021-06-08 MED ORDER — CYCLOBENZAPRINE HCL 5 MG PO TABS
10.0000 mg | ORAL_TABLET | Freq: Once | ORAL | Status: AC
  Administered 2021-06-08: 10 mg via ORAL
  Filled 2021-06-08: qty 2

## 2021-06-08 MED ORDER — METOCLOPRAMIDE HCL 5 MG/ML IJ SOLN
10.0000 mg | Freq: Once | INTRAMUSCULAR | Status: AC
  Administered 2021-06-08: 10 mg via INTRAVENOUS
  Filled 2021-06-08: qty 2

## 2021-06-08 NOTE — MAU Note (Signed)
Patient states she went to her OB appointment yesterday and her BP was elevated but she had not taken her blood pressure medication yet. Patient states she has had a headache since that appointment.

## 2021-06-08 NOTE — MAU Provider Note (Signed)
History     [id number]  Arrival date and time: 06/08/21 1616    Chief Complaint  Patient presents with   Headache    Severe headache since yesterday     HPI Jennifer Holmes is a 35 y.o. at [redacted]w[redacted]d by 8wk [place] with PMHx notable for hx of severe PreE, cHTN on labetalol, hx of preterm delivery, prior cesarean x1 followed by VBAC x2, who presents for headache and elevated BP.   Patient well known to me from prior visits in the clinic Reports that her blood pressure has been elevated over the past week Since yesterday she has been having a severe headache She has tried to take tylenol but it has not helped Good fetal movement No bleeding or loss of fluid No contractions No vision changes, chest pain, SOB, RUQ pain, LE edema Has been compliant with all medications including labetalol 200mg  TID   O/Positive/-- (02/02 0929)  OB History     Gravida  4   Para  3   Term  1   Preterm  2   AB  0   Living  3      SAB  0   IAB  0   Ectopic  0   Multiple  0   Live Births  3        Obstetric Comments  Had a blood transfusion with first pregnancy after her C/S in 10-07[birth year]1983.         Past Medical History:  Diagnosis Date   Gastroesophageal Reflux Disease (GERD)    Headache    HELLP syndrome    History of blood transfusion for PP hemorrhage    Postoperative Nausea and Vomiting (PONV)    Preterm labor     Past Surgical History:  Procedure Laterality Date   CESAREAN SECTION N/A 03/01/2008    Family History  Problem Relation Age of Onset   Hypertension Mother    Heart disease Mother    Hypertension Brother     Social History   Socioeconomic History   Marital status: Married    Spouse name: Not on file   Number of children: Not on file   Years of education: Not on file   Highest education level: Not on file  Occupational History   Not on file  Tobacco Use   Smoking status: Never   Smokeless tobacco: Never  Vaping Use   Vaping Use: Never  used  Substance and Sexual Activity   Alcohol use: No   Drug use: No   Sexual activity: Yes    Birth control/protection: None  Other Topics Concern   Not on file  Social History Narrative   Not on file   Social Determinants of Health   Financial Resource Strain: Not on file  Food Insecurity: No Food Insecurity   Worried About Running Out of Food in the Last Year: Never true   Ran Out of Food in the Last Year: Never true  Transportation Needs: No Transportation Needs   Lack of Transportation (Medical): No   Lack of Transportation (Non-Medical): No  Physical Activity: Not on file  Stress: Not on file  Social Connections: Not on file  Intimate Partner Violence: Not on file    Allergies  Allergen Reactions   Lactose Intolerance (Gi) Other (See Comments)    Reaction:  GI upset    No current facility-administered medications on file prior to encounter.   Current Outpatient Medications on File Prior to Encounter  Medication Sig  Dispense Refill   aspirin EC 81 MG tablet Take 1 tablet (81 mg total) by mouth daily. Swallow whole. 30 tablet 11   labetalol (NORMODYNE) 200 MG tablet TAKE 1 TABLET (200 MG TOTAL) BY MOUTH 3 (THREE) TIMES DAILY. 90 tablet 2   nitrofurantoin, macrocrystal-monohydrate, (MACROBID) 100 MG capsule Take 1 capsule (100 mg total) by mouth 2 (two) times daily for 7 days. 14 capsule 0   omeprazole (PRILOSEC) 20 MG capsule Take 1 capsule (20 mg total) by mouth daily. 30 capsule 0   Prenatal Vit-Fe Fumarate-FA (PRENATAL MULTIVITAMIN) TABS tablet Take 1 tablet by mouth daily at 12 noon.     [DISCONTINUED] NIFEdipine (PROCARDIA-XL/NIFEDICAL-XL) 30 MG 24 hr tablet Take 1 tablet (30 mg total) by mouth daily. 60 tablet 1     ROS Pertinent positives and negative per HPI, all others reviewed and negative  Physical Exam   BP 138/85   Pulse 78   Temp 98.2 F (36.8 C) (Oral)   Resp 18   Ht 5\' 2"  (1.575 m)   Wt 97.5 kg   LMP 10/26/2020   SpO2 100%   BMI 39.32  kg/m   Patient Vitals for the past 24 hrs:  BP Temp Temp src Pulse Resp SpO2 Height Weight  06/08/21 1731 138/85 -- -- 78 -- -- -- --  06/08/21 1716 139/81 -- -- 81 -- -- -- --  06/08/21 1701 (!) 144/79 -- -- 79 -- -- -- --  06/08/21 1649 (!) 153/84 98.2 F (36.8 C) Oral 79 18 100 % 5\' 2"  (1.575 m) 97.5 kg    Physical Exam Vitals reviewed.  Constitutional:      General: She is not in acute distress.    Appearance: She is well-developed. She is not diaphoretic.  Eyes:     General: No scleral icterus. Pulmonary:     Effort: Pulmonary effort is normal. No respiratory distress.  Abdominal:     General: There is no distension.     Palpations: Abdomen is soft.     Tenderness: There is no abdominal tenderness. There is no guarding or rebound.  Skin:    General: Skin is warm and dry.  Neurological:     Mental Status: She is alert.     Coordination: Coordination normal.     Cervical Exam    Bedside Ultrasound Not done  My interpretation: n/a  FHT Baseline 140, moderate variability, +accels, no decels Toco: rare ctx Cat: I  Labs Results for orders placed or performed during the hospital encounter of 06/08/21 (from the past 24 hour(s))  CBC     Status: Abnormal   Collection Time: 06/08/21  5:12 PM  Result Value Ref Range   WBC 6.9 4.0 - 10.5 K/uL   RBC 3.78 (L) 3.87 - 5.11 MIL/uL   Hemoglobin 9.7 (L) 12.0 - 15.0 g/dL   HCT 06/10/21 (L) 06/10/21 - 43.3 %   MCV 79.1 (L) 80.0 - 100.0 fL   MCH 25.7 (L) 26.0 - 34.0 pg   MCHC 32.4 30.0 - 36.0 g/dL   RDW 29.5 (H) 18.8 - 41.6 %   Platelets 384 150 - 400 K/uL   nRBC 0.4 (H) 0.0 - 0.2 %  Comprehensive metabolic panel     Status: Abnormal   Collection Time: 06/08/21  5:12 PM  Result Value Ref Range   Sodium 138 135 - 145 mmol/L   Potassium 3.5 3.5 - 5.1 mmol/L   Chloride 110 98 - 111 mmol/L   CO2 21 (L) 22 -  32 mmol/L   Glucose, Bld 114 (H) 70 - 99 mg/dL   BUN <5 (L) 6 - 20 mg/dL   Creatinine, Ser 9.47 0.44 - 1.00 mg/dL    Calcium 7.9 (L) 8.9 - 10.3 mg/dL   Total Protein 5.9 (L) 6.5 - 8.1 g/dL   Albumin 2.6 (L) 3.5 - 5.0 g/dL   AST 23 15 - 41 U/L   ALT 16 0 - 44 U/L   Alkaline Phosphatase 105 38 - 126 U/L   Total Bilirubin 0.3 0.3 - 1.2 mg/dL   GFR, Estimated >09 >62 mL/min   Anion gap 7 5 - 15  Protein / creatinine ratio, urine     Status: Abnormal   Collection Time: 06/08/21  5:30 PM  Result Value Ref Range   Creatinine, Urine 116.23 mg/dL   Total Protein, Urine 24 mg/dL   Protein Creatinine Ratio 0.21 (H) 0.00 - 0.15 mg/mg[Cre]    Imaging No results found.  MAU Course  Procedures Lab Orders  CBC  Comprehensive metabolic panel  Protein / creatinine ratio, urine  Meds ordered this encounter  Medications   cyclobenzaprine (FLEXERIL) tablet 10 mg   acetaminophen (TYLENOL) tablet 1,000 mg   lactated ringers bolus 1,000 mL   metoCLOPramide (REGLAN) injection 10 mg   Imaging Orders  No imaging studies ordered today    MDM moderate  Assessment and Plan  #Headache in pregnancy #cHTN #Hx of HELLP Patient presenting with headache. Given 1L LR, flexeril, tylenol, and reglan. She took a nap and her headache had resolved. Labs reassuring with benign CBC, CMP, UPCR. BP's have also normalized. Not c/w superimposed PreE, reassured patient but also discussed strict return precautions.   #FWB FHT Cat I NST: Reactive  Discharged to home in stable condition.    Venora Maples, MD/MPH 06/08/21 7:35 PM  Allergies as of 06/08/2021       Reactions   Lactose Intolerance (gi) Other (See Comments)   Reaction:  GI upset        Medication List     STOP taking these medications    nitrofurantoin (macrocrystal-monohydrate) 100 MG capsule Commonly known as: MACROBID       TAKE these medications    aspirin EC 81 MG tablet Take 1 tablet (81 mg total) by mouth daily. Swallow whole.   labetalol 200 MG tablet Commonly known as: NORMODYNE TAKE 1 TABLET (200 MG TOTAL) BY MOUTH 3 (THREE)  TIMES DAILY.   omeprazole 20 MG capsule Commonly known as: PRILOSEC Take 1 capsule (20 mg total) by mouth daily.   prenatal multivitamin Tabs tablet Take 1 tablet by mouth daily at 12 noon.

## 2021-06-09 LAB — CULTURE, OB URINE

## 2021-06-09 LAB — URINE CULTURE, OB REFLEX

## 2021-06-14 ENCOUNTER — Ambulatory Visit (INDEPENDENT_AMBULATORY_CARE_PROVIDER_SITE_OTHER): Payer: Self-pay | Admitting: Certified Nurse Midwife

## 2021-06-14 ENCOUNTER — Other Ambulatory Visit: Payer: Self-pay

## 2021-06-14 ENCOUNTER — Ambulatory Visit (INDEPENDENT_AMBULATORY_CARE_PROVIDER_SITE_OTHER): Payer: Self-pay

## 2021-06-14 VITALS — BP 162/79 | HR 75 | Wt 215.6 lb

## 2021-06-14 DIAGNOSIS — Z789 Other specified health status: Secondary | ICD-10-CM

## 2021-06-14 DIAGNOSIS — O10919 Unspecified pre-existing hypertension complicating pregnancy, unspecified trimester: Secondary | ICD-10-CM

## 2021-06-14 DIAGNOSIS — O0993 Supervision of high risk pregnancy, unspecified, third trimester: Secondary | ICD-10-CM

## 2021-06-14 DIAGNOSIS — Z3A33 33 weeks gestation of pregnancy: Secondary | ICD-10-CM
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
## 2021-06-14 NOTE — Progress Notes (Signed)
Pt informed that the ultrasound is considered a limited OB ultrasound and is not intended to be a complete ultrasound exam.  Patient also informed that the ultrasound is not being completed with the intent of assessing for fetal or placental anomalies or any pelvic abnormalities.  Explained that the purpose of today's ultrasound is to assess for presentation, BPP and amniotic fluid volume.  Patient acknowledges the purpose of the exam and the limitations of the study.    Pt states she has been having H/A every Ayson Cherubini since last week - today very mild. Tylenol does not relieve the pain.  She denies visual disturbances.

## 2021-06-21 ENCOUNTER — Other Ambulatory Visit: Payer: Self-pay

## 2021-06-21 ENCOUNTER — Ambulatory Visit (INDEPENDENT_AMBULATORY_CARE_PROVIDER_SITE_OTHER): Payer: Self-pay | Admitting: Certified Nurse Midwife

## 2021-06-21 ENCOUNTER — Ambulatory Visit: Payer: Self-pay | Admitting: *Deleted

## 2021-06-21 ENCOUNTER — Ambulatory Visit: Payer: Self-pay | Attending: Maternal & Fetal Medicine

## 2021-06-21 VITALS — BP 140/82 | HR 76

## 2021-06-21 VITALS — BP 151/104 | HR 92 | Wt 219.3 lb

## 2021-06-21 DIAGNOSIS — Z3A34 34 weeks gestation of pregnancy: Secondary | ICD-10-CM

## 2021-06-21 DIAGNOSIS — O368130 Decreased fetal movements, third trimester, not applicable or unspecified: Secondary | ICD-10-CM

## 2021-06-21 DIAGNOSIS — O3443 Maternal care for other abnormalities of cervix, third trimester: Secondary | ICD-10-CM

## 2021-06-21 DIAGNOSIS — O09213 Supervision of pregnancy with history of pre-term labor, third trimester: Secondary | ICD-10-CM

## 2021-06-21 DIAGNOSIS — N879 Dysplasia of cervix uteri, unspecified: Secondary | ICD-10-CM

## 2021-06-21 DIAGNOSIS — O10913 Unspecified pre-existing hypertension complicating pregnancy, third trimester: Secondary | ICD-10-CM

## 2021-06-21 DIAGNOSIS — O34219 Maternal care for unspecified type scar from previous cesarean delivery: Secondary | ICD-10-CM

## 2021-06-21 DIAGNOSIS — O0990 Supervision of high risk pregnancy, unspecified, unspecified trimester: Secondary | ICD-10-CM

## 2021-06-21 DIAGNOSIS — O10919 Unspecified pre-existing hypertension complicating pregnancy, unspecified trimester: Secondary | ICD-10-CM

## 2021-06-21 DIAGNOSIS — O0993 Supervision of high risk pregnancy, unspecified, third trimester: Secondary | ICD-10-CM

## 2021-06-21 DIAGNOSIS — O10013 Pre-existing essential hypertension complicating pregnancy, third trimester: Secondary | ICD-10-CM

## 2021-06-21 NOTE — Procedures (Signed)
Jennifer Holmes 1986/11/24 [redacted]w[redacted]d  Fetus A Non-Stress Test Interpretation for 06/21/21  Indication: Chronic Hypertenstion  Fetal Heart Rate A Mode: External Baseline Rate (A): 140 bpm Variability: Moderate Accelerations: 15 x 15 Decelerations: None Multiple birth?: No  Uterine Activity Mode: Palpation, Toco Contraction Frequency (min): none Resting Tone Palpated: Relaxed Resting Time: Adequate  Interpretation (Fetal Testing) Nonstress Test Interpretation: Reactive Overall Impression: Reassuring for gestational age Comments: Dr. Judeth Cornfield reviewed tracing.

## 2021-06-21 NOTE — Progress Notes (Signed)
Patient stated that she had contractions last night that were about "10 minutes or less" apart. Patient stated that her contractions would last "for a few seconds" then would go away. She also noticed clear vaginal discharge "for the first time"   Rodarius Kichline, CMA

## 2021-06-21 NOTE — Progress Notes (Signed)
PRENATAL VISIT NOTE  Subjective:  Jennifer Holmes is a 35 y.o. [phone] at [redacted]w[redacted]d being seen today for ongoing prenatal care.  She is currently monitored for the following issues for this high-risk pregnancy and has Hepatic steatosis; Chronic hypertension affecting pregnancy; History of severe pre-eclampsia; Vitamin D deficiency; Supervision of high risk pregnancy, antepartum; Subchorionic hematoma in first trimester; History of preterm delivery; Hx successful VBAC (vaginal birth after cesarean), currently pregnant; History of cesarean delivery; [redacted] weeks gestation of pregnancy; ASCUS of cervix with negative high risk HPV; and Atypical glandular cells of undetermined significance (AGUS) on cervical Pap smear on their problem list.  Patient reports  irregular contractions that started last night. She states that they last "less than a minute" and are about every 10 minutes rating them a 4/10 on the pain scale. She states that hot baths and ambulation relieve her discomfort. She states that she had a long contraction this morning and has not felt the baby move since. She said that he was very active with movement all night but nothing since 5:45 AM. Pt admits to not taking BP medication this AM d/t busy home life.  .  Contractions: Irritability. Vag. Bleeding: None.  Movement: Absent. Denies leaking of fluid.   The following portions of the patient's history were reviewed and updated as appropriate: allergies, current medications, past family history, past medical history, past social history, past surgical history and problem list.   Objective:   Vitals:   06/21/21 0855 06/21/21 0904  BP: (!) 147/103 (!) 151/104  Pulse: 87 92  Weight: 219 lb 4.8 oz (99.5 kg)     Fetal Status: Fetal Heart Rate (bpm): 145 Fundal Height: 36 cm Movement: Absent     General:  Alert, oriented and cooperative. Patient is in no acute distress.  Skin: Skin is warm and dry. No rash noted.   Cardiovascular: Normal  heart rate noted  Respiratory: Normal respiratory effort, no problems with respiration noted  Abdomen: Soft, gravid, appropriate for gestational age.  Pain/Pressure: Absent     Pelvic: Cervical exam deferred        Extremities: Normal range of motion.  Edema: Trace  Mental Status: Normal mood and affect. Normal behavior. Normal judgment and thought content.   Assessment and Plan:  Pregnancy: [id number] at [redacted]w[redacted]d 1. Supervision of high risk pregnancy in third trimester - Jennifer Holmes is doing well otherwise. Vigorous fetal movement upon discharge today. - Encouraged increased water intake, perinatal stretching and frequent ambulation for discomfort with braxton hicks contractions.   2. [redacted] weeks gestation of pregnancy - GBS next visit. Education and instructions provided at bedside for GBS   3. Chronic hypertension affecting pregnancy - BP elevated in the office today x3, less elevated after NST and no s/sx of PEC.  - Strongly encouraged pt to take BP meds at start of day prior to leaving the house and explained risks of being hypertensive - Follow up with in-office blood pressure check on Friday.   4. Decreased fetal movements in third trimester, single or unspecified fetus - NST today revealed a baseline of 140 with 15x15 accelerations and no decelerations. NST Reactive and Reassuring.  - Fetal kick counts reviewed, pt felt movement as soon as she was set up for the NST  Preterm labor symptoms and general obstetric precautions including but not limited to vaginal bleeding, contractions, leaking of fluid and fetal movement were reviewed in detail with the patient. Please refer to After Visit Summary for other counseling recommendations.  Return in about 2 weeks (around 07/05/2021) for LROB w GBS, IN-PERSON.  Future Appointments  Date Time Provider Department Center  06/23/2021  8:30 AM [facility] NURSE [facility]  06/28/2021  9:30 AM [facility] NURSE [facility]  06/28/2021  9:45 AM [facility] US6  [facility]  06/28/2021 10:45 AM [facility] NST [facility]  07/03/2021  1:35 PM Adam Phenix, MD [facility]  07/03/2021  2:15 PM [facility] NST [facility]  07/05/2021 10:55 AM Bernerd Limbo, CNM [facility]    Signed:  Wateen Varon Danella Deis) Suzie Portela, BSN, RNC-OB  Student Nurse-Midwife   06/21/2021  1:29 PM

## 2021-06-23 ENCOUNTER — Ambulatory Visit (INDEPENDENT_AMBULATORY_CARE_PROVIDER_SITE_OTHER): Payer: Self-pay | Admitting: *Deleted

## 2021-06-23 ENCOUNTER — Other Ambulatory Visit: Payer: Self-pay

## 2021-06-23 ENCOUNTER — Ambulatory Visit: Payer: Self-pay

## 2021-06-23 ENCOUNTER — Telehealth: Payer: Self-pay | Admitting: *Deleted

## 2021-06-23 VITALS — BP 139/85 | HR 72 | Ht 61.0 in | Wt 216.3 lb

## 2021-06-23 DIAGNOSIS — Z0130 Encounter for examination of blood pressure without abnormal findings: Secondary | ICD-10-CM

## 2021-06-23 DIAGNOSIS — O0990 Supervision of high risk pregnancy, unspecified, unspecified trimester: Secondary | ICD-10-CM

## 2021-06-23 NOTE — Telephone Encounter (Signed)
Jennifer Holmes did not keep her nurse visit appointment today for bp check. I called her with Interpreter Jennifer Holmes.  She states she forgot, she didn't sleep well. I asked if she is taking her labetolol in the morning before she leaves home and TId. She states she sometimes forgets when she is rushing out in am. I emphasized it is important to take in morning before she leaves home and again two more time for total of TID. I asked if she can come in this morning or Monday. She agreed to 10:30 today. Meliyah Simon,RN

## 2021-06-23 NOTE — Progress Notes (Signed)
Here for bp check - [redacted]w[redacted]d . Had prenatal visit 06/21/21 with elevated BP of 151/104. Provider reinforced importance of taking bp med in am soon after she gets up and before she leaves home, and then twice more for total TID. Patient states she took bp med this am about 64. She denies headache, edema, visual changes. She reports good fetal movement  FHR today 140. BP 139/85. Discussed with Dr. Alvester Morin and no changes. Discussed with patient to go to hospital for decreased fetal movement, severe headache, sudden large amount edema, etc.  She voices understanding. Reviewed next ob visit appointment with her. Shanekqua Schaper,RN

## 2021-06-27 NOTE — Progress Notes (Signed)
Attestation of Attending Supervision of clinical support staff: I agree with the care provided to this patient and was available for any consultation.  I have reviewed the RN's note and chart. I was available for consult and to see the patient if needed.   Roverto Bodmer Niles Laaibah Wartman, MD, MPH, ABFM Attending Physician Faculty Practice- [facility]  

## 2021-06-28 ENCOUNTER — Ambulatory Visit: Payer: Self-pay | Admitting: *Deleted

## 2021-06-28 ENCOUNTER — Other Ambulatory Visit: Payer: Self-pay

## 2021-06-28 ENCOUNTER — Encounter: Payer: Self-pay | Admitting: *Deleted

## 2021-06-28 ENCOUNTER — Ambulatory Visit (HOSPITAL_BASED_OUTPATIENT_CLINIC_OR_DEPARTMENT_OTHER): Payer: Self-pay | Admitting: *Deleted

## 2021-06-28 ENCOUNTER — Ambulatory Visit: Payer: Self-pay | Attending: Maternal & Fetal Medicine

## 2021-06-28 VITALS — BP 130/75 | HR 75

## 2021-06-28 DIAGNOSIS — O0990 Supervision of high risk pregnancy, unspecified, unspecified trimester: Secondary | ICD-10-CM

## 2021-06-28 DIAGNOSIS — O34219 Maternal care for unspecified type scar from previous cesarean delivery: Secondary | ICD-10-CM

## 2021-06-28 DIAGNOSIS — O10013 Pre-existing essential hypertension complicating pregnancy, third trimester: Secondary | ICD-10-CM

## 2021-06-28 DIAGNOSIS — O10913 Unspecified pre-existing hypertension complicating pregnancy, third trimester: Secondary | ICD-10-CM

## 2021-06-28 DIAGNOSIS — Z362 Encounter for other antenatal screening follow-up: Secondary | ICD-10-CM

## 2021-06-28 DIAGNOSIS — Z3A35 35 weeks gestation of pregnancy: Secondary | ICD-10-CM

## 2021-06-28 DIAGNOSIS — O3443 Maternal care for other abnormalities of cervix, third trimester: Secondary | ICD-10-CM

## 2021-06-28 DIAGNOSIS — O09213 Supervision of pregnancy with history of pre-term labor, third trimester: Secondary | ICD-10-CM

## 2021-06-28 DIAGNOSIS — N879 Dysplasia of cervix uteri, unspecified: Secondary | ICD-10-CM

## 2021-06-28 NOTE — Procedures (Signed)
Orion Dynesha Woolen 13-Dec-1985 [redacted]w[redacted]d  Fetus A Non-Stress Test Interpretation for 06/28/21  Indication: Chronic Hypertenstion  Fetal Heart Rate A Mode: External Baseline Rate (A): 135 bpm Variability: Moderate Accelerations: 15 x 15 Decelerations: None Multiple birth?: No  Uterine Activity Mode: Palpation, Toco Contraction Frequency (min): 3 uc's Contraction Duration (sec): 50-70 Contraction Quality: Mild Resting Tone Palpated: Relaxed Resting Time: Adequate  Interpretation (Fetal Testing) Nonstress Test Interpretation: Reactive Overall Impression: Reassuring for gestational age Comments: Dr. Grace Bushy reviewed tracing.

## 2021-07-03 ENCOUNTER — Other Ambulatory Visit: Payer: Self-pay

## 2021-07-03 ENCOUNTER — Ambulatory Visit: Payer: Self-pay | Admitting: *Deleted

## 2021-07-03 ENCOUNTER — Ambulatory Visit (INDEPENDENT_AMBULATORY_CARE_PROVIDER_SITE_OTHER): Payer: Self-pay

## 2021-07-03 ENCOUNTER — Ambulatory Visit (INDEPENDENT_AMBULATORY_CARE_PROVIDER_SITE_OTHER): Payer: Self-pay | Admitting: Obstetrics & Gynecology

## 2021-07-03 VITALS — BP 139/89 | HR 77 | Wt 219.5 lb

## 2021-07-03 DIAGNOSIS — O0990 Supervision of high risk pregnancy, unspecified, unspecified trimester: Secondary | ICD-10-CM

## 2021-07-03 DIAGNOSIS — Z98891 History of uterine scar from previous surgery: Secondary | ICD-10-CM

## 2021-07-03 DIAGNOSIS — Z3A35 35 weeks gestation of pregnancy: Secondary | ICD-10-CM

## 2021-07-03 DIAGNOSIS — O34219 Maternal care for unspecified type scar from previous cesarean delivery: Secondary | ICD-10-CM

## 2021-07-03 DIAGNOSIS — O10919 Unspecified pre-existing hypertension complicating pregnancy, unspecified trimester: Secondary | ICD-10-CM

## 2021-07-03 DIAGNOSIS — Z8759 Personal history of other complications of pregnancy, childbirth and the puerperium: Secondary | ICD-10-CM

## 2021-07-03 NOTE — Progress Notes (Signed)
Pt denies H/A or visual disturbances.  

## 2021-07-05 ENCOUNTER — Ambulatory Visit (INDEPENDENT_AMBULATORY_CARE_PROVIDER_SITE_OTHER): Payer: Self-pay | Admitting: Certified Nurse Midwife

## 2021-07-05 ENCOUNTER — Other Ambulatory Visit (HOSPITAL_COMMUNITY)
Admission: RE | Admit: 2021-07-05 | Discharge: 2021-07-05 | Disposition: A | Discharge status: Home / Self Care | Payer: Self-pay | Source: Ambulatory Visit | Attending: Certified Nurse Midwife | Admitting: Certified Nurse Midwife

## 2021-07-05 ENCOUNTER — Other Ambulatory Visit: Payer: Self-pay

## 2021-07-05 ENCOUNTER — Encounter: Payer: Self-pay | Admitting: Certified Nurse Midwife

## 2021-07-05 VITALS — BP 144/93 | HR 79 | Wt 221.4 lb

## 2021-07-05 DIAGNOSIS — O0990 Supervision of high risk pregnancy, unspecified, unspecified trimester: Secondary | ICD-10-CM | POA: Insufficient documentation

## 2021-07-05 DIAGNOSIS — Z3A36 36 weeks gestation of pregnancy: Secondary | ICD-10-CM

## 2021-07-05 DIAGNOSIS — O10919 Unspecified pre-existing hypertension complicating pregnancy, unspecified trimester: Secondary | ICD-10-CM

## 2021-07-05 DIAGNOSIS — N949 Unspecified condition associated with female genital organs and menstrual cycle: Secondary | ICD-10-CM

## 2021-07-05 DIAGNOSIS — N898 Other specified noninflammatory disorders of vagina: Secondary | ICD-10-CM

## 2021-07-05 LAB — COMPREHENSIVE METABOLIC PANEL
ALT: 12 IU/L (ref 0–32)
AST: 22 IU/L (ref 0–40)
Albumin/Globulin Ratio: 0.9 — ABNORMAL LOW (ref 1.2–2.2)
Albumin: 3 g/dL — ABNORMAL LOW (ref 3.8–4.8)
Alkaline Phosphatase: 157 IU/L — ABNORMAL HIGH (ref 44–121)
BUN/Creatinine Ratio: 10 (ref 9–23)
BUN: 6 mg/dL (ref 6–20)
Bilirubin Total: 0.3 mg/dL (ref 0.0–1.2)
CO2: 21 mmol/L (ref 20–29)
Calcium: 8.9 mg/dL (ref 8.7–10.2)
Chloride: 103 mmol/L (ref 96–106)
Creatinine, Ser: 0.61 mg/dL (ref 0.57–1.00)
Globulin, Total: 3.2 g/dL (ref 1.5–4.5)
Glucose: 102 mg/dL — ABNORMAL HIGH (ref 65–99)
Potassium: 3.5 mmol/L (ref 3.5–5.2)
Sodium: 140 mmol/L (ref 134–144)
Total Protein: 6.2 g/dL (ref 6.0–8.5)
eGFR: 121 mL/min/{1.73_m2} (ref >59)

## 2021-07-05 LAB — CBC
Hematocrit: 31.8 % — ABNORMAL LOW (ref 34.0–46.6)
Hemoglobin: 10.2 g/dL — ABNORMAL LOW (ref 11.1–15.9)
MCH: 24.5 pg — ABNORMAL LOW (ref 26.6–33.0)
MCHC: 32.1 g/dL (ref 31.5–35.7)
MCV: 76 fL — ABNORMAL LOW (ref 79–97)
Platelets: 401 10*3/uL (ref 150–450)
RBC: 4.16 x10E6/uL (ref 3.77–5.28)
RDW: 16.8 % — ABNORMAL HIGH (ref 11.7–15.4)
WBC: 6.9 10*3/uL (ref 3.4–10.8)

## 2021-07-05 LAB — POCT URINALYSIS DIP (DEVICE)
Bilirubin Urine: NEGATIVE
Glucose, UA: NEGATIVE mg/dL
Hgb urine dipstick: NEGATIVE
Ketones, ur: NEGATIVE mg/dL
Nitrite: NEGATIVE
Protein, ur: NEGATIVE mg/dL
Specific Gravity, Urine: 1.02 (ref 1.005–1.030)
Urobilinogen, UA: 0.2 mg/dL (ref 0.0–1.0)
pH: 7 (ref 5.0–8.0)

## 2021-07-05 NOTE — Progress Notes (Signed)
PRENATAL VISIT NOTE  Subjective:  Jennifer Holmes is a 35 y.o. [phone] at 62w0dbeing seen today for ongoing prenatal care.  She is currently monitored for the following issues for this high-risk pregnancy and has Hepatic steatosis; Chronic hypertension affecting pregnancy; History of severe pre-eclampsia; Vitamin D deficiency; Supervision of high risk pregnancy, antepartum; History of preterm delivery; Hx successful VBAC (vaginal birth after cesarean), currently pregnant; History of cesarean delivery; ASCUS of cervix with negative high risk HPV; and Atypical glandular cells of undetermined significance (AGUS) on cervical Pap smear on their problem list.  Patient reports a "Mild"headache that started upon arrival to the office today. She states that her headaches are usually relieved by rest and tylenol. She denies neural symptoms, visual changes and right sided abdominal pain. She also reports braxton hick contractions that are more frequent  at night. She endorses a burning sensation and vaginal itching during urination that started this morning.She denies vaginal odor or discharge. Contractions: Irritability. Vag. Bleeding: None.  Movement: Present. Denies leaking of fluid.   The following portions of the patient's history were reviewed and updated as appropriate: allergies, current medications, past family history, past medical history, past social history, past surgical history and problem list.   Objective:   Vitals:   07/05/21 1114  BP: (!) 144/93  Pulse: 79  Weight: 100.4 kg    Fetal Status: Fetal Heart Rate (bpm): 135 Fundal Height: 38 cm Movement: Present  Presentation: Vertex  General:  Alert, oriented and cooperative. Patient is in no acute distress.  Skin: Skin is warm and dry. No rash noted.   Cardiovascular: Normal heart rate noted  Respiratory: Normal respiratory effort, no problems with respiration noted  Abdomen: Soft, gravid, appropriate for gestational age.   Pain/Pressure: Present     Pelvic: Cervical exam performed in the presence of a chaperone Dilation: 1.5 Effacement (%): 60 Station: Ballotable  Extremities: Normal range of motion.  Edema: Mild +1  Mental Status: Normal mood and affect. Normal behavior. Normal judgment and thought content.   Assessment and Plan:  Pregnancy: [id number] 358w0d. Supervision of high risk pregnancy, antepartum - Candies is doing well and endorses daily vigorous fetal movement.  - Culture, beta strep (group b only) - GC/Chlamydia probe amp [facility]. Chronic hypertension affecting pregnancy - On 20077mabetalol BID, elevated today (not severe range) - CMD (Dr. ConElly Modenand MFM in agreement that pt should deliver 37-39wks, will repeat labs today and schedule IOL if labs worse than last check 06/08/21.  - HA present but mild and pt has been rushing, has not eaten and has not taken Tylenol. No other s/sx of PEC. - Hx of preterm labor x2, pt may present in SOL prior to 37wks - schedule IOL at next visit if not delivered by then. - Comp Met (CMET) - CBC - Protein / creatinine ratio, urine  3. Vaginal discomfort, itching and burning - Urinalysis colleted. Results pending.  - Will notify pt via MyChart if abnormal results.   4. [redacted] weeks gestation of pregnancy - Weekly antenatal fetal testing for cHTN. Follow up at 1 week for repeat BPP and testing.  - Discussed labor expectations and when to present to MAU.   Preterm labor symptoms and general obstetric precautions including but not limited to vaginal bleeding, contractions, leaking of fluid and fetal movement were reviewed in detail with the patient. Please refer to After Visit Summary for other counseling recommendations.   Return in about  1 week (around 07/12/2021) for IN-PERSON, HOB.  Future Appointments  Date Time Provider [place]  07/12/2021  2:15 PM [facility]  07/12/2021  3:15 PM Clarnce Flock, MD [facility]     Signed:  Kendrick Fries Isaias Sakai) Rollene Rotunda, BSN, RNC-OB  Student Nurse-Midwife   07/05/2021  2:09 PM

## 2021-07-05 NOTE — Progress Notes (Signed)
Pt states that this morning she started having burning and itching while urinating. No discharge or bleeding present.

## 2021-07-06 ENCOUNTER — Telehealth: Payer: Self-pay

## 2021-07-06 LAB — URINALYSIS
Bilirubin, UA: NEGATIVE
Glucose, UA: NEGATIVE
Ketones, UA: NEGATIVE
Nitrite, UA: NEGATIVE
RBC, UA: NEGATIVE
Specific Gravity, UA: 1.016 (ref 1.005–1.030)
Urobilinogen, Ur: 0.2 mg/dL (ref 0.2–1.0)
pH, UA: 7 (ref 5.0–7.5)

## 2021-07-06 LAB — PROTEIN / CREATININE RATIO, URINE
Creatinine, Urine: 98 mg/dL
Protein, Ur: 29 mg/dL
Protein/Creat Ratio: 296 mg/g creat — ABNORMAL HIGH (ref 0–200)

## 2021-07-06 LAB — GC/CHLAMYDIA PROBE AMP (~~LOC~~) NOT AT ARMC
Chlamydia: NEGATIVE
Comment: NEGATIVE
Comment: NORMAL
Neisseria Gonorrhea: NEGATIVE

## 2021-07-06 NOTE — Telephone Encounter (Signed)
Patient called in requesting results for GBS and GC/Chlamydia. Writer notified pt those results were not back yet, we would be calling her if those results came back abnormal. Pt verbalized understanding and did not have any additional questions or concerns at this time.   Wynona Canes, [facility] 07/06/21

## 2021-07-09 LAB — CULTURE, BETA STREP (GROUP B ONLY): Strep Gp B Culture: NEGATIVE

## 2021-07-12 ENCOUNTER — Ambulatory Visit (INDEPENDENT_AMBULATORY_CARE_PROVIDER_SITE_OTHER): Payer: Self-pay

## 2021-07-12 ENCOUNTER — Ambulatory Visit: Payer: Self-pay | Admitting: *Deleted

## 2021-07-12 ENCOUNTER — Encounter: Payer: Self-pay | Admitting: Family Medicine

## 2021-07-12 ENCOUNTER — Other Ambulatory Visit: Payer: Self-pay

## 2021-07-12 ENCOUNTER — Ambulatory Visit (INDEPENDENT_AMBULATORY_CARE_PROVIDER_SITE_OTHER): Payer: Self-pay | Admitting: Family Medicine

## 2021-07-12 ENCOUNTER — Ambulatory Visit: Payer: Self-pay | Admitting: Clinical

## 2021-07-12 VITALS — BP 145/100 | HR 82 | Wt 222.5 lb

## 2021-07-12 DIAGNOSIS — O10919 Unspecified pre-existing hypertension complicating pregnancy, unspecified trimester: Secondary | ICD-10-CM

## 2021-07-12 DIAGNOSIS — F411 Generalized anxiety disorder: Secondary | ICD-10-CM

## 2021-07-12 DIAGNOSIS — F430 Acute stress reaction: Secondary | ICD-10-CM

## 2021-07-12 DIAGNOSIS — Z7189 Other specified counseling: Secondary | ICD-10-CM

## 2021-07-12 DIAGNOSIS — Z8759 Personal history of other complications of pregnancy, childbirth and the puerperium: Secondary | ICD-10-CM

## 2021-07-12 DIAGNOSIS — O34219 Maternal care for unspecified type scar from previous cesarean delivery: Secondary | ICD-10-CM

## 2021-07-12 DIAGNOSIS — O0990 Supervision of high risk pregnancy, unspecified, unspecified trimester: Secondary | ICD-10-CM

## 2021-07-12 NOTE — Patient Instructions (Signed)
Center for [facility] at [facility] [address] [place], [place] [address] [phone] (main office) [phone] [facility] office)  [email or web address]      [facility] de mujeres  Plan posparto para la familia   Un plan de juego realista para ayudarnos a adaptarnos a la vida con un nuevo beb.     REUNIN CREATIVA Desarrolla un plan Metas:  Proporcione una forma de comenzar una conversacin sobre su nueva vida con un beb  Ayudar a los padres a Public house manager y [employer] recursos a su alcance  Ayuda a Media planner camino antes del nacimiento para un perodo de transicin ms fcil despus.  Haga una lista de la siguiente informacin para Pharmacologist en una ubicacin central:  Nombre completo de mam y compaero: ______________________  Zollie Beckers completo y fecha de nacimiento del beb: ________________ Direccion de casa: __________________________ Hortencia Conradi de casa: _________________________  Nmeros de clulas de los padres: _____________________  Zollie Beckers e informacin de contacto para OB: ____________________  Zollie Beckers e informacin de contacto del pediatra: _______________________________  Informacin de contacto para Consultores de Lactancia: ________________________  [place] Peter DUERME * Cada uno necesita al menos 4-5 horas de sueo ininterrumpido todos los [place]. Escriba nombres especficos e informacin de contacto. *  Cmo vas a descansar en el perodo posparto? Mientras el compaero est en casa? Cuando el compaero vuelve al Aleen Campi? Cuando ambos vuelven al Aleen Campi?  Dnde dormir tu beb?   Quin est disponible para ayudar Administrator? Noche? Noche?  Quin podra mudarse por un perodo para ayudarlo?   Cules son algunas ideas para ayudarlo a dormir lo  suficiente? ________________________________________________________________________________________________________________________________________________________________________________________________________________________________________________________________________________________________________________________  [place] Dragon * Planifique las comidas antes de que nazca su beb para que pueda comer alimentos saludables durante el perodo de posparto inmediato. *  Quin se ocupar del desayuno? Almuerzo? Cena? Lista de nombres e informacin de contacto. Haga una lluvia de ideas rpidas y saludables para cada comida.  Qu puede hacer antes de que nazca el beb para preparar las comidas para el perodo de posparto?  Cmo pueden otros ayudarte con las comidas?  Qu supermercados ofrecen compras y entregas en lnea?  Qu restaurantes ofrecen opciones de comida para llevar o para llevar? ______________________________________________________________________________________________________________________________________________________________________________________________________________________________________________________________________________________________________________________________________________________________________________________________________  CUIDADO CON LA MAM * Es importante que mam sea cuidada y [place] en el perodo de posparto. Recuerde, las formas ms importantes en que las nuevas madres necesitan atencin son: sueo, nutricin, ejercicio suave y [place]. *  Quin puede venir a cuidar a Educational psychologist perodo? Haga una lista de personas con su informacin de contacto.  Enumere algunas actividades que lo hagan sentir protegido, descansado y con Engineer, drilling. Quin puede asegurarse de tener oportunidades para hacer estas cosas?  Tiene mam un espacio propio dentro de su hogar que sea solo para ella? Haga una "cueva de mam" donde  pueda sentirse cmoda, descansar y renovarse a s misma a diario. ______________________________________________________________________________________________________________________________________________________________________________________________________________________________________________________________________________________________________________________________________________________________________________________________________  Barrie Lyme ALIMENTACIN DEL BEB * Las personas informadas y Manufacturing engineer ofrecern el mejor apoyo con respecto a la alimentacin de su beb. *  Edquese y elija la mejor opcin de alimentacin para su beb.  Haga una lista de las personas que lo guiarn, apoyarn y sern un recurso para usted a medida que cuida y [place] a su beb. (Amigos que han amamantado o estn amamantando actualmente, consultores de [place], grupos de apoyo para la Market researcher, etc.)  Oralia Manis doula de posparto. (Estos sitios web pueden brindarle informacin: dona.org y https://shea.org/)  Busque  de lactancia materna, como el grupo de apoyo a la lactancia en Women's o La Leche League. ________________________________________________________________________________________________________________________________________________________________________________________________________________________________________________________________________________________________________________________  PENDIENTES Y ERRANDS  Quin puede ayudar con una limpieza profunda antes de que nazca el beb?  Haga una lista de las personas que lo ayudarn con las tareas domsticas y las tareas domsticas, como lavandera, limpieza ligera, platos, baos, etc.  Quin puede hacerte algunos mandados?  Qu puedes hacer para asegurarte de tener provisiones bsicas antes de que nazca el beb?  Quin har las  compras? __________________________________________________________________________________________________________________________________________________________________________________________________________________________________________ AJUSTE FAMILIAR * Nutrirse ... ayuda a los padres a ser ms amorosos y permite una mejor unin con sus hijos. *  Qu tipo de cosas disfrutan t y tu pareja juntos? Qu actividades te ayudan a conectarte y fortalecer tu relacin? Haga una lista de esas cosas. Haga una lista de las personas en quienes confa para que cuiden a su beb para que puedan pasar un tiempo juntos como pareja.  Qu tipos de cosas ayudan a la pareja a sentirse conectada con mam? Hacer una lista.  Qu necesidades tendr el compaero para vincularse con el beb? Otros nios? Quin los cuidar cuando entre a trabajar y mientras est en el hospital?  Piensa cules podran ser las necesidades de tus hijos mayores. Quin puede ayudarlo a satisfacer esas necesidades? De qu manera los est ayudando a prepararse para llevar a su beb a casa? Enumere algunas estrategias especficas que tiene para el ajuste familiar. ______________________________________________________________________________________________________________________________________________________________________________________________________________________________________________________________________________________________________________________________________________________________________________________________________  APOYO * Alguien que puede empatizar con las experiencias normaliza tus problemas y los hace ms llevaderos. *  Haga una lista de otros amigos, vecinos y / o compaeros de trabajo que conozca con bebs (y nios pequeos, si corresponde) con quienes puede conectarse.  Haga una lista de grupos de apoyo locales o en lnea, grupos de madres, etc. en los que pueda  participar. __________________________________________________________________________________________________________________________________________________________________________________________________________________________________________     PLANES DE CUIDADO DE NIOS  Investigar y planificar el cuidado de los nios si la mam regresa al trabajo.  Hable sobre las preocupaciones de mam sobre su transicin al trabajo.  Hable sobre las preocupaciones de los socios con respecto a esta transicin.  SALUD MENTAL * Su salud mental es una de las principales prioridades para una madre embarazada o posparto.   1 de cada 5 mujeres experimenta ansiedad y / o depresin desde el momento de la concepcin hasta el primer ao despus del nacimiento.  Los trastornos del estado de nimo posparto son la complicacin n.  1 del embarazo y el parto, y el sufrimiento experimentado por estas madres no es necesario! Estas enfermedades son temporales y responden bien al tratamiento, que a menudo incluye autocuidado, apoyo social, terapia de conversacin y medicamentos cuando sea necesario.  Las mujeres que experimentan ansiedad y depresin a menudo dicen cosas como: "Se supone que soy feliz ... por qu me siento tan triste?", "Por qu no puedo salir de eso?", "Tengo pensamientos que asustan" yo."  No hay necesidad de avergonzarse si siente estos sntomas:  Abrumado, ansioso, enojado, triste, culpable, irritable, desesperado, agotado pero no puede dormer No estas solo. Usted no tiene la culpa. Con ayuda, estars bien.  Dnde puedo encontrar ayuda? Profesionales mdicos como su obstetra, partera, gineclogo, mdico familiar, proveedor de atencin primaria, pediatra o proveedores de salud mental; Grupos de apoyo de [facility]: Feelings After Birth, Grupo de apoyo para la lactancia materna, Baby and Me Group, y [employer]os clases de ejercicios.  Tienes permiso para pedir ayuda. Confirmar sus sentimientos,  validar   sus experiencias, compartir / aprender estrategias de afrontamiento y obtendr apoyo y aliento a medida que se recupere. Eres importante!  IDEA GENIAL  Haga una lista de recursos locales, incluidos los recursos para mam y para el compaero.  Identificar grupos de apoyo.  Identifique a las personas para llamar a altas horas de la noche: incluya nombres e informacin de contacto.  Hable con su pareja sobre el estado de nimo perinatal y los trastornos de ansiedad.  Hable con su obstetra, partera y doula sobre el baby blues y sobre el estado de nimo perinatal y los trastornos de ansiedad.  Hable con su pediatra sobre el estado de nimo perinatal y los trastornos de ansiedad.  
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
## 2021-07-12 NOTE — Patient Instructions (Signed)
Eleccin del mtodo anticonceptivo Contraception Choices La anticoncepcin, o los mtodos anticonceptivos, hace referencia a los mtodos o dispositivos que evitan el embarazo. Mtodos hormonales Implante anticonceptivo Un implante anticonceptivo consiste en un tubo delgado de plstico que contiene una hormona que evita el embarazo. Es diferente de un dispositivo intrauterino (DIU). Un mdico lo inserta en la parte superior del brazo. Los implantes pueden ser eficaces durante un mximo de 3 aos. Inyecciones de progestina sola Las inyecciones de progestina sola contienen progestina, una forma sinttica de la hormona progesterona. Un mdico las administra cada 3 meses. Pldoras anticonceptivas Las pldoras anticonceptivas son pastillas que contienen hormonas que evitan el embarazo. Deben tomarse una vez al da, preferentemente a la misma hora cada da. Se necesita una receta para utilizar este mtodo anticonceptivo. Parche anticonceptivo El parche anticonceptivo contiene hormonas que evitan el embarazo. Se coloca en la piel, debe cambiarse una vez a la semana durante tres semanas y debe retirarse en la cuarta semana. Se necesita una receta para utilizar este mtodo anticonceptivo. Anillo vaginal Un anillo vaginal contiene hormonas que evitan el embarazo. Se coloca en la vagina durante tres semanas y se retira en la cuarta semana. Luego se repite el proceso con un anillo nuevo. Se necesita una receta para utilizar este mtodo anticonceptivo. Anticonceptivo de emergencia Los anticonceptivos de emergencia son mtodos para evitar un embarazo despus de tener sexo sin proteccin. Vienen en forma de pldora y pueden tomarse hasta 5 das despus de tener sexo. Funcionan mejor cuando se toman lo ms pronto posible luego de tener sexo. La mayora de los anticonceptivos de emergencia estn disponibles sin receta mdica. Este mtodo no debe utilizarse como el nico mtodo anticonceptivo. Mtodos de  barrera Condn masculino Un condn masculino es una vaina delgada que se coloca sobre el pene durante el sexo. Los condones evitan que el esperma ingrese en el cuerpo de la mujer. Pueden utilizarse con un una sustancia que mata a los espermatozoides (espermicida) para aumentar la efectividad. Deben desecharse despus de un uso. Condn femenino Un condn femenino es una vaina blanda y holgada que se coloca en la vagina antes de tener sexo. El condn evita que el esperma ingrese en el cuerpo de la mujer. Deben desecharse despus de un uso. Diafragma Un diafragma es una barrera blanda con forma de cpula. Se inserta en la vagina antes del sexo, junto con un espermicida. El diafragma bloquea el ingreso de esperma en el tero, y el espermicida mata a los espermatozoides. El diafragma debe permanecer en la vagina durante 6 a 8 horas despus de tener sexo y debe retirarse en el plazo de las 24 horas. Un diafragma es recetado y colocado por un mdico. Debe reemplazarse cada 1 a 2 aos, despus de dar a luz, de aumentar ms de 15lb (6.8kg) y de una ciruga plvica. Capuchn cervical Un capuchn cervical es una copa redonda y blanda de ltex o plstico que se coloca en el cuello uterino. Se inserta en la vagina antes del sexo, junto con un espermicida. Bloquea el ingreso del esperma en el tero. El capuchn debe permanecer en el lugar durante 6 a 8 horas despus de tener sexo y debe retirarse en el plazo de las 48 horas. Un capuchn cervical debe ser recetado y colocado por un mdico. Debe reemplazarse cada 2aos. Esponja Una esponja es una pieza blanda y circular de espuma de poliuretano que contiene espermicida. La esponja ayuda a bloquear el ingreso de esperma en el tero, y el espermicida mata a   los espermatozoides. Para utilizarla, debe humedecerla e insertarla en la vagina. Debe insertarse antes de tener sexo, debe permanecer dentro al menos durante 6 horas despus de tener sexo y debe retirarse y  desecharse en el plazo de las 30 horas. Espermicidas Los espermicidas son sustancias qumicas que matan o bloquean al esperma y no lo dejan ingresar al cuello uterino y al tero. Vienen en forma de crema, gel, supositorio, espuma o comprimido. Un espermicida debe insertarse en la vagina con un aplicador al menos 10 o 15 minutos antes de tener sexo para dar tiempo a que surta efecto. El proceso debe repetirse cada vez que tenga sexo. Los espermicidas no requieren receta mdica. Anticonceptivos intrauterinos Dispositivo intrauterino (DIU) Un DIU es un dispositivo en forma de T que se coloca en el tero. Existen dos tipos: DIU hormonal.Este tipo contiene progestina, una forma sinttica de la hormona progesterona. Este tipo puede permanecer colocado durante 3 a 5 aos. DIU de cobre.Este tipo est recubierto con un alambre de cobre. Puede permanecer colocado durante 10 aos. Mtodos anticonceptivos permanentes Ligadura de trompas en la mujer En este mtodo, se sellan, atan u obstruyen las trompas de Falopio durante una ciruga para evitar que el vulo descienda hacia el tero. Esterilizacin histeroscpica En este mtodo, se coloca un implante pequeo y flexible dentro de cada trompa de Falopio. Los implantes hacen que se forme un tejido cicatricial en las trompas de Falopio y que las obstruya para que el espermatozoide no pueda llegar al vulo. El procedimiento demora alrededor de 3 meses para que sea efectivo. Debe utilizarse otro mtodo anticonceptivo durante esos 3 meses. Esterilizacin masculina Este es un procedimiento que consiste en atar los conductos que transportan el esperma (vasectoma). Luego del procedimiento, el hombre puede eyacular lquido (semen). Debe utilizarse otro mtodo anticonceptivo durante 3 meses despus del procedimiento. Mtodos de planificacin natural Planificacin familiar natural En este mtodo, la pareja no tiene sexo durante los das en que la mujer podra quedar  embarazada. Mtodo calendario En este mtodo, la mujer realiza un seguimiento de la duracin de cada ciclo menstrual, identifica los das en los que se puede producir un embarazo y no tiene sexo durante esos das. Mtodo de la ovulacin En este mtodo, la pareja evita tener sexo durante la ovulacin. Mtodo sintotrmico Este mtodo implica no tener sexo durante la ovulacin. Normalmente, la mujer comprueba la ovulacin al observar cambios en su temperatura y en la consistencia del moco cervical. Mtodo posovulacin En este mtodo, la pareja espera a que finalice la ovulacin para tener sexo. Dnde buscar ms informacin Centers for Disease Control and Prevention (Centros para el Control y la Prevencin de Enfermedades): www.cdc.gov Resumen La anticoncepcin, o los mtodos anticonceptivos, hace referencia a los mtodos o dispositivos que evitan el embarazo. Los mtodos anticonceptivos hormonales incluyen implantes, inyecciones, pastillas, parches, anillos vaginales y anticonceptivos de emergencia. Los mtodos anticonceptivos de barrera pueden incluir condones masculinos, condones femeninos, diafragmas, capuchones cervicales, esponjas y espermicidas. Existen dos tipos de DIU (dispositivo intrauterino). Un DIU puede colocarse en el tero de una mujer para evitar el embarazo durante 3 a 5 aos. La esterilizacin permanente puede realizarse mediante un procedimiento tanto en los hombres como en las mujeres. Los mtodos de planificacin familiar natural implican no tener sexo durante los das en que la mujer podra quedar embarazada. Esta informacin no tiene como fin reemplazar el consejo del mdico. Asegrese de hacerle al mdico cualquier pregunta que tenga. Document Revised: 06/28/2020 Document Reviewed: 06/28/2020 Elsevier Patient Education  2022 Elsevier   Inc.  
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
## 2021-07-20 ENCOUNTER — Telehealth: Payer: Self-pay | Admitting: *Deleted

## 2021-07-20 NOTE — Telephone Encounter (Signed)
Per chart review, pt did not go to [facility] for evaluation of elevated BP as instructed on 8/3. In addition, pt cancelled office appt which was scheduled for tomorrow via the automated phone reminder system. I called pt with interpreter Eda Royal. She stated that she did not know why the appt got cancelled and she did not mean for that to happen.  I advised pt that it is very important for her to have office visit tomorrow as well as fetal testing. She voiced understanding and agreed to appointments starting @ 0915.

## 2021-07-21 ENCOUNTER — Encounter (HOSPITAL_COMMUNITY): Payer: Self-pay | Admitting: Family Medicine

## 2021-07-21 ENCOUNTER — Inpatient Hospital Stay (HOSPITAL_COMMUNITY)
Admission: AD | Admit: 2021-07-21 | Discharge: 2021-07-23 | DRG: 807 | Disposition: A | Discharge status: Home / Self Care | Payer: Medicaid Other | Attending: Family Medicine | Admitting: Family Medicine

## 2021-07-21 ENCOUNTER — Encounter: Payer: Self-pay | Admitting: Family Medicine

## 2021-07-21 ENCOUNTER — Ambulatory Visit (INDEPENDENT_AMBULATORY_CARE_PROVIDER_SITE_OTHER): Payer: Self-pay | Admitting: Family Medicine

## 2021-07-21 ENCOUNTER — Other Ambulatory Visit: Payer: Self-pay

## 2021-07-21 VITALS — BP 180/101 | HR 78 | Wt 222.5 lb

## 2021-07-21 DIAGNOSIS — Z3A38 38 weeks gestation of pregnancy: Secondary | ICD-10-CM | POA: Diagnosis not present

## 2021-07-21 DIAGNOSIS — O0990 Supervision of high risk pregnancy, unspecified, unspecified trimester: Secondary | ICD-10-CM

## 2021-07-21 DIAGNOSIS — K219 Gastro-esophageal reflux disease without esophagitis: Secondary | ICD-10-CM | POA: Diagnosis present

## 2021-07-21 DIAGNOSIS — Z20822 Contact with and (suspected) exposure to covid-19: Secondary | ICD-10-CM | POA: Diagnosis present

## 2021-07-21 DIAGNOSIS — O1493 Unspecified pre-eclampsia, third trimester: Secondary | ICD-10-CM

## 2021-07-21 DIAGNOSIS — O1002 Pre-existing essential hypertension complicating childbirth: Secondary | ICD-10-CM | POA: Diagnosis present

## 2021-07-21 DIAGNOSIS — O34211 Maternal care for low transverse scar from previous cesarean delivery: Secondary | ICD-10-CM

## 2021-07-21 DIAGNOSIS — Z8759 Personal history of other complications of pregnancy, childbirth and the puerperium: Secondary | ICD-10-CM

## 2021-07-21 DIAGNOSIS — R87610 Atypical squamous cells of undetermined significance on cytologic smear of cervix (ASC-US): Secondary | ICD-10-CM | POA: Diagnosis present

## 2021-07-21 DIAGNOSIS — O114 Pre-existing hypertension with pre-eclampsia, complicating childbirth: Principal | ICD-10-CM | POA: Diagnosis present

## 2021-07-21 DIAGNOSIS — O34219 Maternal care for unspecified type scar from previous cesarean delivery: Secondary | ICD-10-CM

## 2021-07-21 DIAGNOSIS — O9962 Diseases of the digestive system complicating childbirth: Secondary | ICD-10-CM | POA: Diagnosis present

## 2021-07-21 DIAGNOSIS — Z8751 Personal history of pre-term labor: Secondary | ICD-10-CM

## 2021-07-21 DIAGNOSIS — O1414 Severe pre-eclampsia complicating childbirth: Secondary | ICD-10-CM

## 2021-07-21 DIAGNOSIS — O1490 Unspecified pre-eclampsia, unspecified trimester: Secondary | ICD-10-CM | POA: Diagnosis present

## 2021-07-21 DIAGNOSIS — O10919 Unspecified pre-existing hypertension complicating pregnancy, unspecified trimester: Secondary | ICD-10-CM

## 2021-07-21 DIAGNOSIS — R87619 Unspecified abnormal cytological findings in specimens from cervix uteri: Secondary | ICD-10-CM

## 2021-07-21 DIAGNOSIS — O119 Pre-existing hypertension with pre-eclampsia, unspecified trimester: Secondary | ICD-10-CM

## 2021-07-21 LAB — COMPREHENSIVE METABOLIC PANEL
ALT: 18 U/L (ref 0–44)
AST: 25 U/L (ref 15–41)
Albumin: 2.8 g/dL — ABNORMAL LOW (ref 3.5–5.0)
Alkaline Phosphatase: 148 U/L — ABNORMAL HIGH (ref 38–126)
Anion gap: 8 (ref 5–15)
BUN: 5 mg/dL — ABNORMAL LOW (ref 6–20)
CO2: 19 mmol/L — ABNORMAL LOW (ref 22–32)
Calcium: 8.4 mg/dL — ABNORMAL LOW (ref 8.9–10.3)
Chloride: 109 mmol/L (ref 98–111)
Creatinine, Ser: 0.37 mg/dL — ABNORMAL LOW (ref 0.44–1.00)
GFR, Estimated: >60 mL/min (ref >60)
Glucose, Bld: 72 mg/dL (ref 70–99)
Potassium: 3.5 mmol/L (ref 3.5–5.1)
Sodium: 136 mmol/L (ref 135–145)
Total Bilirubin: 0.7 mg/dL (ref 0.3–1.2)
Total Protein: 6.2 g/dL — ABNORMAL LOW (ref 6.5–8.1)

## 2021-07-21 LAB — CBC
HCT: 32.3 % — ABNORMAL LOW (ref 36.0–46.0)
Hemoglobin: 10.2 g/dL — ABNORMAL LOW (ref 12.0–15.0)
MCH: 23.9 pg — ABNORMAL LOW (ref 26.0–34.0)
MCHC: 31.6 g/dL (ref 30.0–36.0)
MCV: 75.6 fL — ABNORMAL LOW (ref 80.0–100.0)
Platelets: 423 10*3/uL — ABNORMAL HIGH (ref 150–400)
RBC: 4.27 MIL/uL (ref 3.87–5.11)
RDW: 18 % — ABNORMAL HIGH (ref 11.5–15.5)
WBC: 7 10*3/uL (ref 4.0–10.5)
nRBC: 0.3 % — ABNORMAL HIGH (ref 0.0–0.2)

## 2021-07-21 LAB — TYPE AND SCREEN
ABO/RH(D): O POS
Antibody Screen: NEGATIVE

## 2021-07-21 LAB — PROTEIN / CREATININE RATIO, URINE
Creatinine, Urine: 58.69 mg/dL
Protein Creatinine Ratio: 0.78 mg/mg{Cre} — ABNORMAL HIGH (ref 0.00–0.15)
Total Protein, Urine: 46 mg/dL

## 2021-07-21 LAB — RESP PANEL BY RT-PCR (FLU A&B, COVID) ARPGX2
Influenza A by PCR: NEGATIVE
Influenza B by PCR: NEGATIVE
SARS Coronavirus 2 by RT PCR: NEGATIVE

## 2021-07-21 MED ORDER — LIDOCAINE HCL (PF) 1 % IJ SOLN
30.0000 mL | INTRAMUSCULAR | Status: AC | PRN
  Administered 2021-07-21: 30 mL via SUBCUTANEOUS
  Filled 2021-07-21: qty 30

## 2021-07-21 MED ORDER — METHYLERGONOVINE MALEATE 0.2 MG/ML IJ SOLN: 0.2000 mg | Freq: Once | INTRAMUSCULAR | Status: DC

## 2021-07-21 MED ORDER — OXYCODONE-ACETAMINOPHEN 5-325 MG PO TABS
1.0000 | ORAL_TABLET | ORAL | Status: DC | PRN
  Administered 2021-07-21: 1 via ORAL
  Filled 2021-07-21: qty 1

## 2021-07-21 MED ORDER — LABETALOL HCL 5 MG/ML IV SOLN
80.0000 mg | INTRAVENOUS | Status: DC | PRN
  Administered 2021-07-21: 80 mg via INTRAVENOUS
  Filled 2021-07-21: qty 16

## 2021-07-21 MED ORDER — FENTANYL CITRATE (PF) 100 MCG/2ML IJ SOLN
INTRAMUSCULAR | Status: AC
  Administered 2021-07-21: 100 ug
  Filled 2021-07-21: qty 2

## 2021-07-21 MED ORDER — TRANEXAMIC ACID-NACL 1000-0.7 MG/100ML-% IV SOLN
INTRAVENOUS | Status: AC
  Administered 2021-07-21: 1000 mg
  Filled 2021-07-21: qty 100

## 2021-07-21 MED ORDER — LOPERAMIDE HCL 2 MG PO CAPS
2.0000 mg | ORAL_CAPSULE | Freq: Once | ORAL | Status: AC
  Administered 2021-07-21: 2 mg via ORAL
  Filled 2021-07-21: qty 1

## 2021-07-21 MED ORDER — FENTANYL CITRATE (PF) 100 MCG/2ML IJ SOLN
INTRAMUSCULAR | Status: AC
  Filled 2021-07-21: qty 2

## 2021-07-21 MED ORDER — HYDRALAZINE HCL 20 MG/ML IJ SOLN
10.0000 mg | INTRAMUSCULAR | Status: DC | PRN
  Administered 2021-07-21: 10 mg via INTRAVENOUS
  Filled 2021-07-21: qty 1

## 2021-07-21 MED ORDER — LACTATED RINGERS IV SOLN: INTRAVENOUS | Status: DC

## 2021-07-21 MED ORDER — OXYCODONE-ACETAMINOPHEN 5-325 MG PO TABS: 1.0000 | ORAL_TABLET | ORAL | Status: DC | PRN

## 2021-07-21 MED ORDER — METHYLERGONOVINE MALEATE 0.2 MG/ML IJ SOLN
INTRAMUSCULAR | Status: AC
  Filled 2021-07-21: qty 1

## 2021-07-21 MED ORDER — LABETALOL HCL 5 MG/ML IV SOLN
20.0000 mg | INTRAVENOUS | Status: DC | PRN
  Administered 2021-07-21 (×2): 20 mg via INTRAVENOUS
  Filled 2021-07-21 (×2): qty 4

## 2021-07-21 MED ORDER — CARBOPROST TROMETHAMINE 250 MCG/ML IM SOLN
250.0000 ug | INTRAMUSCULAR | Status: DC | PRN
  Administered 2021-07-21 (×2): 250 ug via INTRAMUSCULAR
  Filled 2021-07-21 (×2): qty 1

## 2021-07-21 MED ORDER — SOD CITRATE-CITRIC ACID 500-334 MG/5ML PO SOLN: 30.0000 mL | ORAL | Status: DC | PRN

## 2021-07-21 MED ORDER — OXYTOCIN-SODIUM CHLORIDE 30-0.9 UT/500ML-% IV SOLN
2.5000 [IU]/h | INTRAVENOUS | Status: DC
  Administered 2021-07-22: 2.5 [IU]/h via INTRAVENOUS
  Filled 2021-07-21 (×2): qty 500

## 2021-07-21 MED ORDER — ACETAMINOPHEN 325 MG PO TABS
650.0000 mg | ORAL_TABLET | ORAL | Status: DC | PRN
Start: 2021-07-21 — End: 2021-07-22
  Administered 2021-07-21: 650 mg via ORAL
  Filled 2021-07-21: qty 2

## 2021-07-21 MED ORDER — OXYTOCIN-SODIUM CHLORIDE 30-0.9 UT/500ML-% IV SOLN
1.0000 m[IU]/min | INTRAVENOUS | Status: DC
  Administered 2021-07-21: 2 m[IU]/min via INTRAVENOUS

## 2021-07-21 MED ORDER — OXYCODONE-ACETAMINOPHEN 5-325 MG PO TABS: 2.0000 | ORAL_TABLET | ORAL | Status: DC | PRN

## 2021-07-21 MED ORDER — LABETALOL HCL 5 MG/ML IV SOLN
40.0000 mg | INTRAVENOUS | Status: DC | PRN
  Administered 2021-07-21: 40 mg via INTRAVENOUS
  Filled 2021-07-21: qty 8

## 2021-07-21 MED ORDER — OXYTOCIN BOLUS FROM INFUSION
333.0000 mL | Freq: Once | INTRAVENOUS | Status: AC
  Administered 2021-07-21: 333 mL via INTRAVENOUS

## 2021-07-21 MED ORDER — TERBUTALINE SULFATE 1 MG/ML IJ SOLN: 0.2500 mg | Freq: Once | INTRAMUSCULAR | Status: DC | PRN

## 2021-07-21 MED ORDER — MAGNESIUM SULFATE 40 GM/1000ML IV SOLN
2.0000 g/h | INTRAVENOUS | Status: AC
  Administered 2021-07-21 – 2021-07-22 (×2): 2 g/h via INTRAVENOUS
  Filled 2021-07-21 (×2): qty 1000

## 2021-07-21 MED ORDER — TRANEXAMIC ACID-NACL 1000-0.7 MG/100ML-% IV SOLN
1000.0000 mg | Freq: Once | INTRAVENOUS | Status: AC
  Administered 2021-07-21: 1000 mg via INTRAVENOUS

## 2021-07-21 MED ORDER — ONDANSETRON HCL 4 MG/2ML IJ SOLN
4.0000 mg | Freq: Four times a day (QID) | INTRAMUSCULAR | Status: DC | PRN
  Administered 2021-07-21: 4 mg via INTRAVENOUS
  Filled 2021-07-21: qty 2

## 2021-07-21 MED ORDER — LABETALOL HCL 200 MG PO TABS
300.0000 mg | ORAL_TABLET | Freq: Three times a day (TID) | ORAL | Status: DC
  Administered 2021-07-21: 300 mg via ORAL
  Filled 2021-07-21: qty 1

## 2021-07-21 MED ORDER — LACTATED RINGERS IV SOLN
500.0000 mL | INTRAVENOUS | Status: DC | PRN
  Administered 2021-07-21: 75 mL via INTRAVENOUS

## 2021-07-21 MED ORDER — TRANEXAMIC ACID-NACL 1000-0.7 MG/100ML-% IV SOLN
INTRAVENOUS | Status: AC
  Filled 2021-07-21: qty 100

## 2021-07-21 MED ORDER — FENTANYL CITRATE (PF) 100 MCG/2ML IJ SOLN
50.0000 ug | Freq: Once | INTRAMUSCULAR | Status: AC
Start: 2021-07-21 — End: 2021-07-21
  Administered 2021-07-21: 50 ug via INTRAVENOUS

## 2021-07-21 MED ORDER — LABETALOL HCL 200 MG PO TABS: 200.0000 mg | ORAL_TABLET | Freq: Three times a day (TID) | ORAL | Status: DC

## 2021-07-21 MED ORDER — MAGNESIUM SULFATE BOLUS VIA INFUSION
4.0000 g | Freq: Once | INTRAVENOUS | Status: AC
  Administered 2021-07-21: 4 g via INTRAVENOUS
  Filled 2021-07-21: qty 1000

## 2021-07-21 MED ORDER — CARBOPROST TROMETHAMINE 250 MCG/ML IM SOLN
INTRAMUSCULAR | Status: AC
  Administered 2021-07-22: 250 ug via INTRAMUSCULAR
  Filled 2021-07-21: qty 1

## 2021-07-21 NOTE — H&P (Addendum)
OBSTETRIC ADMISSION HISTORY AND PHYSICAL  Jennifer Holmes is a 35 y.o. female [phone] with IUP at [redacted]w[redacted]d by LMP presenting for IOL-cHTN(Labetolol) w/ SIPE w/ SF based on her blood pressures. She reports +FMs, no LOF, no VB, no blurry vision, headaches, peripheral edema, or RUQ pain.  She plans on breast feeding. She request Nexplanon OP for birth control.  She received her prenatal care at  [facility]  Dating: By LMP --->  Estimated Date of Delivery: 08/02/21  Sono:   @[redacted]w[redacted]d , normal anatomy, cephalic presentation, posterior placental lie, 2803g, 74% EFW  Prenatal History/Complications:  Hx of HELLP, GERD, prior CS  Past Medical History: Past Medical History:  Diagnosis Date   Gastroesophageal Reflux Disease (GERD)    Headache    HELLP syndrome    History of blood transfusion for PP hemorrhage    Postoperative Nausea and Vomiting (PONV)    Preterm labor     Past Surgical History: Past Surgical History:  Procedure Laterality Date   CESAREAN SECTION N/A 03/01/2008    Obstetrical History: OB History     Gravida  4   Para  3   Term  1   Preterm  2   AB  0   Living  3      SAB  0   IAB  0   Ectopic  0   Multiple  0   Live Births  3        Obstetric Comments  Had a blood transfusion with first pregnancy after her C/S in 03/03/2008.         Social History Social History   Socioeconomic History   Marital status: Married    Spouse name: Not on file   Number of children: Not on file   Years of education: Not on file   Highest education level: Not on file  Occupational History   Not on file  Tobacco Use   Smoking status: Never   Smokeless tobacco: Never  Vaping Use   Vaping Use: Never used  Substance and Sexual Activity   Alcohol use: No   Drug use: No   Sexual activity: Yes    Birth control/protection: None  Other Topics Concern   Not on file  Social History Narrative   Not on file   Social Determinants of Health   Financial Resource  Strain: Not on file  Food Insecurity: No Food Insecurity   Worried About Running Out of Food in the Last Year: Never true   Ran Out of Food in the Last Year: Never true  Transportation Needs: No Transportation Needs   Lack of Transportation (Medical): No   Lack of Transportation (Non-Medical): No  Physical Activity: Not on file  Stress: Not on file  Social Connections: Not on file    Family History: Family History  Problem Relation Age of Onset   Hypertension Mother    Heart disease Mother    Hypertension Brother     Allergies: Allergies  Allergen Reactions   Lactose Intolerance (Gi) Other (See Comments)    Reaction:  GI upset    Medications Prior to Admission  Medication Sig Dispense Refill Last Dose   aspirin EC 81 MG tablet Take 1 tablet (81 mg total) by mouth daily. Swallow whole. 30 tablet 11    labetalol (NORMODYNE) 200 MG tablet TAKE 1 TABLET (200 MG TOTAL) BY MOUTH 3 (THREE) TIMES DAILY. 90 tablet 2    omeprazole (PRILOSEC) 20 MG capsule Take 1 capsule (20 mg  total) by mouth daily. 30 capsule 0    Prenatal Vit-Fe Fumarate-FA (PRENATAL MULTIVITAMIN) TABS tablet Take 1 tablet by mouth daily at 12 noon.        Review of Systems   All systems reviewed and negative except as stated in HPI Blood pressure 139/82, pulse 71, temperature 98 F (36.7 C), temperature source Oral, resp. rate 16, last menstrual period 10/26/2020, SpO2 99 %, currently breastfeeding.  General appearance: alert, cooperative, and appears stated age Lungs: clear to auscultation bilaterally Heart: regular rate and rhythm Abdomen: soft, non-tender; bowel sounds normal Pelvic: As stated below  Extremities: Homans sign is negative, no sign of DVT Presentation: cephalic Fetal monitoringBaseline: 140 bpm, Variability: Good {> 6 bpm), Accelerations: Reactive, and Decelerations: Absent Uterine activity: Difficult to trace, ~q8min, irregular  Dilation: 3.5 Effacement (%): 50 Station: -3 Exam by::  maxwell  Prenatal labs: ABO, Rh: --/--/O POS (08/12 1110) Antibody: NEG (08/12 1110) Rubella: 2.44 (02/02 0929) RPR: Non Reactive (05/17 0827)  HBsAg: Negative (02/02 0929)  HIV: Non Reactive (05/17 0827)  GBS: Negative/-- (07/27 1308)  2 hr Glucola Passed Genetic screening: Did not do Anatomy US nml  Prenatal Transfer Tool  Maternal Diabetes: No Genetic Screening: N/A-see above Maternal Ultrasounds/Referrals: Normal Fetal Ultrasounds or other Referrals:  Referred to Materal Fetal Medicine -cHTN on labetolol  Maternal Substance Abuse:  No Significant Maternal Medications:  Labetolol 200 mg TID Significant Maternal Lab Results: Group B Strep negative  Results for orders placed or performed during the hospital encounter of 07/21/21 (from the past 24 hour(s))  CBC   Collection Time: 07/21/21 10:57 AM  Result Value Ref Range   WBC 7.0 4.0 - 10.5 K/uL   RBC 4.27 3.87 - 5.11 MIL/uL   Hemoglobin 10.2 (L) 12.0 - 15.0 g/dL   HCT 16.1 (L) 09.6 - 04.5 %   MCV 75.6 (L) 80.0 - 100.0 fL   MCH 23.9 (L) 26.0 - 34.0 pg   MCHC 31.6 30.0 - 36.0 g/dL   RDW 40.9 (H) 81.1 - 91.4 %   Platelets 423 (H) 150 - 400 K/uL   nRBC 0.3 (H) 0.0 - 0.2 %  Comprehensive metabolic panel   Collection Time: 07/21/21 10:57 AM  Result Value Ref Range   Sodium 136 135 - 145 mmol/L   Potassium 3.5 3.5 - 5.1 mmol/L   Chloride 109 98 - 111 mmol/L   CO2 19 (L) 22 - 32 mmol/L   Glucose, Bld 72 70 - 99 mg/dL   BUN 5 (L) 6 - 20 mg/dL   Creatinine, Ser 7.82 (L) 0.44 - 1.00 mg/dL   Calcium 8.4 (L) 8.9 - 10.3 mg/dL   Total Protein 6.2 (L) 6.5 - 8.1 g/dL   Albumin 2.8 (L) 3.5 - 5.0 g/dL   AST 25 15 - 41 U/L   ALT 18 0 - 44 U/L   Alkaline Phosphatase 148 (H) 38 - 126 U/L   Total Bilirubin 0.7 0.3 - 1.2 mg/dL   GFR, Estimated >95 >62 mL/min   Anion gap 8 5 - 15  Resp Panel by RT-PCR (Flu A&B, Covid) Nasopharyngeal Swab   Collection Time: 07/21/21 11:01 AM   Specimen: Nasopharyngeal Swab; Nasopharyngeal(NP) swabs  in vial transport medium  Result Value Ref Range   SARS Coronavirus 2 by RT PCR NEGATIVE NEGATIVE   Influenza A by PCR NEGATIVE NEGATIVE   Influenza B by PCR NEGATIVE NEGATIVE  Type and screen [facility]   Collection Time: 07/21/21 11:10 AM  Result  Value Ref Range   ABO/RH(D) O POS    Antibody Screen NEG    Sample Expiration      07/24/2021,2359 Performed at [facility], [address], [place], [place] [address]   Protein / creatinine ratio, urine   Collection Time: 07/21/21 11:33 AM  Result Value Ref Range   Creatinine, Urine 58.69 mg/dL   Total Protein, Urine 46 mg/dL   Protein Creatinine Ratio 0.78 (H) 0.00 - 0.15 mg/mg[Cre]    Patient Active Problem List   Diagnosis Date Noted   Preeclampsia 07/21/2021   ASCUS of cervix with negative high risk HPV 02/08/2021   Atypical glandular cells of undetermined significance (AGUS) on cervical Pap smear 02/08/2021   Supervision of high risk pregnancy, antepartum 01/11/2021   History of preterm delivery 01/11/2021   Hx successful VBAC (vaginal birth after cesarean), currently pregnant 01/11/2021   History of cesarean delivery 01/11/2021   Vitamin D deficiency 07/11/2020   History of severe pre-eclampsia 01/27/2015   Chronic hypertension affecting pregnancy    Hepatic steatosis 08/23/2012    Assessment/Plan:  Jennifer Holmes is a 35 y.o. [id number] at [redacted]w[redacted]d here for IOL-cHTN w/ SIPE w/ SF  #IOL:   Patient has hx of CS in G1 pregnancy with successful VBAC x2; SVE 3/50/-3. Will initiate induction with Pitocin and reassess in 3-4 hours. #Pain: PRN #FWB: Cat 1 #ID: GBS neg #MOF: Breast #MOC: Nexplanon OP #Circ:  No #cHTN w/ SIPE w/ SF: Continue with home dose of Labetalol-200 TID. Pt has severe range Bps-165/101, 180/102, 192/113 and is asymptomatic currently. Will continue with mag sulfate and follow up pending CBC, CMP, PCR. Closely monitor pressures. Will give PRN antihypertensives per protocol. #Hx  of cesarean: No Cytotec; successful VBAC x2 #AGUS on cervical pap: Follow up pap after delivery   GME ATTESTATION:  I saw and evaluated the patient. I agree with the findings and the plan of care as documented in the resident's note. I have made changes to documentation as necessary.  Evalina Field, MD OB Fellow, Faculty [facility], Center for [facility] 07/21/2021 1:43 PM

## 2021-07-21 NOTE — Discharge Summary (Signed)
Postpartum Discharge Summary  Date of Service updated     Patient Name: Jennifer Holmes DOB: 05-Nov-1986 MRN: [id number]  Date of admission: 07/21/2021 Delivery date:07/21/2021  Delivering provider: Renard Matter  Date of discharge: 07/23/2021  Admitting diagnosis: Preeclampsia [O14.90] Intrauterine pregnancy: [redacted]w[redacted]d    Secondary diagnosis:  Active Problems:   Chronic hypertension affecting pregnancy   History of severe pre-eclampsia   Supervision of high risk pregnancy, antepartum   ASCUS of cervix with negative high risk HPV   Preeclampsia   Vaginal delivery  Additional problems: None    Discharge diagnosis: Term Pregnancy Delivered                                              Post partum procedures: magnesium Augmentation: Pitocin Complications: [id number] Hospital course: Induction of Labor With Vaginal Delivery   35y.o. yo [phone] [id number] admitted to the hospital 07/21/2021 for induction of labor.  Indication for induction: Preeclampsia.  Patient had an uncomplicated labor course as follows: Membrane Rupture Time/Date: 8:55 PM ,07/21/2021   Delivery Method:Vaginal, Spontaneous  Episiotomy: None  Lacerations:  1st degree  Details of delivery can be found in separate delivery note.  Patient had a routine postpartum course. Patient is discharged home 07/23/21.  Newborn Data: Birth date:07/21/2021  Birth time:9:16 PM  Gender:Female  Living status:Living  Apgars:6 ,9  Weight:3779 g   Magnesium Sulfate received: Yes: Seizure prophylaxis BMZ received: No Rhophylac:N/A MMR:N/A T-DaP:Given prenatally Flu: N/A Transfusion:No  Physical exam  Vitals:   07/22/21 1503 07/22/21 2030 07/23/21 0042 07/23/21 0515  BP: (!) 104/55 124/69 116/77 134/73  Pulse: 78 67 72 69  Resp: _0 Temp: 97.9 F (36.6 C) 98.1 F (36.7 C) 98.1 F (36.7 C) 98 F (36.7 C)  TempSrc: Oral Oral Oral Oral  SpO2: 100% 99% 100% 100%   General: alert, cooperative,  and no distress Lochia: appropriate Uterine Fundus: firm Incision: N/A DVT Evaluation: No evidence of DVT seen on physical exam. Labs: Lab Results  Component Value Date   WBC 12.2 (H) 07/22/2021   HGB 9.3 (L) 07/22/2021   HCT 29.0 (L) 07/22/2021   MCV 74.9 (L) 07/22/2021   PLT 357 07/22/2021   CMP Latest Ref Rng & Units 07/21/2021  Glucose 70 - 99 mg/dL 72  BUN 6 - 20 mg/dL 5(L)  Creatinine 0.44 - 1.00 mg/dL 0.37(L)  Sodium 135 - 145 mmol/L 136  Potassium 3.5 - 5.1 mmol/L 3.5  Chloride 98 - 111 mmol/L 109  CO2 22 - 32 mmol/L 19(L)  Calcium 8.9 - 10.3 mg/dL 8.4(L)  Total Protein 6.5 - 8.1 g/dL 6.2(L)  Total Bilirubin 0.3 - 1.2 mg/dL 0.7  Alkaline Phos 38 - 126 U/L 148(H)  AST 15 - 41 U/L 25  ALT 0 - 44 U/L 18   Edinburgh Score: No flowsheet data found.   After visit meds:  Allergies as of 07/23/2021       Reactions   Lactose Intolerance (gi) Other (See Comments)   Reaction:  GI upset        Medication List     STOP taking these medications    labetalol 200 MG tablet Commonly known as: NORMODYNE   omeprazole 20 MG capsule Commonly known as: PRILOSEC       TAKE these medications    aspirin  EC 81 MG tablet Take 1 tablet (81 mg total) by mouth daily. Swallow whole.   ibuprofen 600 MG tablet Commonly known as: ADVIL Take 1 tablet (600 mg total) by mouth every 6 (six) hours.   NIFEdipine 60 MG 24 hr tablet Commonly known as: ADALAT CC Take 1 tablet (60 mg total) by mouth daily.         Discharge home in stable condition Infant Feeding: Breast Infant Disposition:home with mother Discharge instruction: per After Visit Summary and Postpartum booklet. Activity: Advance as tolerated. Pelvic rest for 6 weeks.  Diet: routine diet Future Appointments: Future Appointments  Date Time Provider Summit  09/04/2021  1:15 PM Griffin Basil, MD [facility]   Follow up Visit:  [place] [facility] [place] [facility] Follow up in 1 week(s).   Specialty: Obstetrics and Gynecology Why: BP check Contact information: [place] [phone]               Message sent to [facility] on 8/12 by Dr. Cy Blamer  Please schedule this patient for a In person postpartum visit in 4 weeks with the following provider: MD. Additional Postpartum F/U:BP check 1 week  High risk pregnancy complicated by: HTN Delivery mode:  Vaginal, Spontaneous  Anticipated Birth Control:   partner vasectomy   07/23/2021 Florian Buff, MD

## 2021-07-21 NOTE — Progress Notes (Addendum)
Labor Progress Note Jennifer Holmes is a 35 y.o. [phone] at [redacted]w[redacted]d presented for IOL-cHTN(Labetolol) w/ SIPE w/ SF-Severe range BP based.   S: Patient is resting in bed and breathing through contractions.  Interpreter in person was used during entirety of visit.   O:  BP (!) 155/86   Pulse 85   Temp 98 F (36.7 C) (Oral)   Resp 16   LMP 10/26/2020   SpO2 99%  EFM: baseline 130 BPM/+accels/-decels  Toco: Regular q4 contractions   CVE: Dilation: 4 Effacement (%): 50 Station: -3 Presentation: Vertex Exam by:: Samariya Rockhold   A&P: 35 y.o. [id number] [redacted]w[redacted]d IOL-cHTN(Labetolol) w/ SIPE w/ SF-Severe range BP based.   #Labor: AROM was attempted by Dr. Mathis Fare. However, pt could not tolerate exam. Discussed with pt the option to continue with pitocin and likely attempt AROM at next check. Pt understands and agrees with plan.  #Pain: PRN #FWB: cat 1>min-mod variability (on mag), no decels. Continue monitoring. #GBS negative #cHTN w/ SIPE w/ SF-BP based: Continue with home dose of Labetalol-200 TID. Pt has had severe range Bps-165/101, 180/102, 192/113 and is asymptomatic currently. Will continue with mag sulfate. PCR is 0.78 and CBC/CMP wnl. Closely monitor pressures: most recent 155/86. Will give PRN antihypertensives per protocol as needed.  Alfredo Martinez, MD Center for [employer], [facility] 6:53 PM  GME ATTESTATION:  I saw and evaluated the patient. I agree with the findings and the plan of care as documented in the resident's note.  Progressing. Patient reports that her contractions are much stronger. Unable to AROM this check. Will re-attempt next check. Will continue to monitor BP closely and consider increasing home dose of Labetalol for better BP control as needed.  Evalina Field, MD OB Fellow, Faculty [facility], Center for [facility] 07/21/2021 7:35 PM

## 2021-07-21 NOTE — Progress Notes (Signed)
   Subjective:  Jennifer Holmes is a 35 y.o. [phone] at [redacted]w[redacted]d being seen today for ongoing prenatal care.  She is currently monitored for the following issues for this high-risk pregnancy and has Hepatic steatosis; Chronic hypertension affecting pregnancy; History of severe pre-eclampsia; Vitamin D deficiency; Supervision of high risk pregnancy, antepartum; History of preterm delivery; Hx successful VBAC (vaginal birth after cesarean), currently pregnant; History of cesarean delivery; ASCUS of cervix with negative high risk HPV; and Atypical glandular cells of undetermined significance (AGUS) on cervical Pap smear on their problem list.  Patient reports no complaints.  Contractions: Irritability. Vag. Bleeding: None.  Movement: Present. Denies leaking of fluid.   The following portions of the patient's history were reviewed and updated as appropriate: allergies, current medications, past family history, past medical history, past social history, past surgical history and problem list. Problem list updated.  Objective:   Vitals:   07/21/21 0926 07/21/21 0927  BP: (!) 165/102 (!) 180/101  Pulse: 78 78  Weight: 222 lb 8 oz (100.9 kg)     Fetal Status: Fetal Heart Rate (bpm): 139   Movement: Present     General:  Alert, oriented and cooperative. Patient is in no acute distress.  Skin: Skin is warm and dry. No rash noted.   Cardiovascular: Normal heart rate noted  Respiratory: Normal respiratory effort, no problems with respiration noted  Abdomen: Soft, gravid, appropriate for gestational age. Pain/Pressure: Present     Pelvic: Vag. Bleeding: None     Cervical exam deferred        Extremities: Normal range of motion.  Edema: Mild pitting, slight indentation  Mental Status: Normal mood and affect. Normal behavior. Normal judgment and thought content.   Urinalysis:      Assessment and Plan:  Pregnancy: [id number] at [redacted]w[redacted]d  1. Supervision of high risk pregnancy, antepartum BP severe  range, sending to L&D for direct admit States she had a feeling that was going to happen and has everything ready and can go directly to hospital FHR normal  2. Chronic hypertension affecting pregnancy On labetalol 200mg  BID, reports she has been taking her meds Denies any symptoms currently though last night did have some shortness of breath  3. History of severe pre-eclampsia Attempted to send her to [facility] last week for PreE eval Going to L&D now for direct admit  4. History of preterm delivery   5. Hx successful VBAC (vaginal birth after cesarean), currently pregnant   6. Atypical glandular cells of undetermined significance (AGUS) on cervical Pap smear Repap after delivery  Term labor symptoms and general obstetric precautions including but not limited to vaginal bleeding, contractions, leaking of fluid and fetal movement were reviewed in detail with the patient. Please refer to After Visit Summary for other counseling recommendations.  Return for PP check.   , MD

## 2021-07-22 ENCOUNTER — Encounter (HOSPITAL_COMMUNITY): Payer: Self-pay | Admitting: Family Medicine

## 2021-07-22 LAB — CBC
HCT: 29 % — ABNORMAL LOW (ref 36.0–46.0)
Hemoglobin: 9.3 g/dL — ABNORMAL LOW (ref 12.0–15.0)
MCH: 24 pg — ABNORMAL LOW (ref 26.0–34.0)
MCHC: 32.1 g/dL (ref 30.0–36.0)
MCV: 74.9 fL — ABNORMAL LOW (ref 80.0–100.0)
Platelets: 357 10*3/uL (ref 150–400)
RBC: 3.87 MIL/uL (ref 3.87–5.11)
RDW: 17.9 % — ABNORMAL HIGH (ref 11.5–15.5)
WBC: 12.2 10*3/uL — ABNORMAL HIGH (ref 4.0–10.5)
nRBC: 0 % (ref 0.0–0.2)

## 2021-07-22 LAB — RPR: RPR Ser Ql: NONREACTIVE

## 2021-07-22 MED ORDER — MEASLES, MUMPS & RUBELLA VAC IJ SOLR
0.5000 mL | Freq: Once | INTRAMUSCULAR | Status: DC
Start: 2021-07-23 — End: 2021-07-24

## 2021-07-22 MED ORDER — NIFEDIPINE ER OSMOTIC RELEASE 60 MG PO TB24
60.0000 mg | ORAL_TABLET | Freq: Every day | ORAL | Status: DC
  Administered 2021-07-22 – 2021-07-23 (×2): 60 mg via ORAL
  Filled 2021-07-22 (×2): qty 1

## 2021-07-22 MED ORDER — ONDANSETRON HCL 4 MG PO TABS: 4.0000 mg | ORAL_TABLET | ORAL | Status: DC | PRN

## 2021-07-22 MED ORDER — DIBUCAINE (PERIANAL) 1 % EX OINT: 1.0000 "application " | TOPICAL_OINTMENT | CUTANEOUS | Status: DC | PRN

## 2021-07-22 MED ORDER — MEDROXYPROGESTERONE ACETATE 150 MG/ML IM SUSP: 150.0000 mg | INTRAMUSCULAR | Status: DC | PRN

## 2021-07-22 MED ORDER — PRENATAL MULTIVITAMIN CH
1.0000 | ORAL_TABLET | Freq: Every day | ORAL | Status: DC
  Administered 2021-07-22 – 2021-07-23 (×2): 1 via ORAL
  Filled 2021-07-22 (×2): qty 1

## 2021-07-22 MED ORDER — COCONUT OIL OIL: 1.0000 "application " | TOPICAL_OIL | Status: DC | PRN

## 2021-07-22 MED ORDER — TETANUS-DIPHTH-ACELL PERTUSSIS 5-2.5-18.5 LF-MCG/0.5 IM SUSY: 0.5000 mL | PREFILLED_SYRINGE | Freq: Once | INTRAMUSCULAR | Status: DC

## 2021-07-22 MED ORDER — WITCH HAZEL-GLYCERIN EX PADS: 1.0000 "application " | MEDICATED_PAD | CUTANEOUS | Status: DC | PRN

## 2021-07-22 MED ORDER — SIMETHICONE 80 MG PO CHEW: 80.0000 mg | CHEWABLE_TABLET | ORAL | Status: DC | PRN

## 2021-07-22 MED ORDER — DIPHENHYDRAMINE HCL 25 MG PO CAPS: 25.0000 mg | ORAL_CAPSULE | Freq: Four times a day (QID) | ORAL | Status: DC | PRN

## 2021-07-22 MED ORDER — AZITHROMYCIN 250 MG PO TABS
500.0000 mg | ORAL_TABLET | Freq: Once | ORAL | Status: AC
  Administered 2021-07-22: 500 mg via ORAL
  Filled 2021-07-22: qty 2

## 2021-07-22 MED ORDER — ONDANSETRON HCL 4 MG/2ML IJ SOLN: 4.0000 mg | INTRAMUSCULAR | Status: DC | PRN

## 2021-07-22 MED ORDER — SENNOSIDES-DOCUSATE SODIUM 8.6-50 MG PO TABS
2.0000 | ORAL_TABLET | Freq: Every day | ORAL | Status: DC
  Administered 2021-07-22 – 2021-07-23 (×2): 2 via ORAL
  Filled 2021-07-22 (×2): qty 2

## 2021-07-22 MED ORDER — FUROSEMIDE 10 MG/ML IJ SOLN
20.0000 mg | Freq: Once | INTRAMUSCULAR | Status: AC
  Administered 2021-07-22: 20 mg via INTRAVENOUS
  Filled 2021-07-22: qty 2

## 2021-07-22 MED ORDER — LACTATED RINGERS IV SOLN: INTRAVENOUS | Status: AC

## 2021-07-22 MED ORDER — BENZOCAINE-MENTHOL 20-0.5 % EX AERO
1.0000 "application " | INHALATION_SPRAY | CUTANEOUS | Status: DC | PRN
  Administered 2021-07-22: 1 via TOPICAL
  Filled 2021-07-22: qty 56

## 2021-07-22 MED ORDER — IBUPROFEN 600 MG PO TABS
600.0000 mg | ORAL_TABLET | Freq: Four times a day (QID) | ORAL | Status: DC
  Administered 2021-07-22 – 2021-07-23 (×7): 600 mg via ORAL
  Filled 2021-07-22 (×7): qty 1

## 2021-07-22 MED ORDER — ACETAMINOPHEN 325 MG PO TABS: 650.0000 mg | ORAL_TABLET | ORAL | Status: DC | PRN

## 2021-07-22 NOTE — Progress Notes (Signed)
Called to patient's bedside for passage of large grapefruit sized clot  Fundus evaluated at the time and 1 cm above umbilicus. Patient given IV Fentanyl and a manual evacuation performed with sterile gloved hands and resulted in removal of several large grapefruit size blood clots. At that time, given second dose of TXA and Hemabate also given along with imodium.  Patient still with some trickling and BP normotensive so 1 dose of IM Methergen given.   Upon evaluation following the above interventions patient's bleeding stopped. EBL 1081. Vital signs stable. Bleeding thought to be secondary to atony in setting of multiparous labor and IV magnesium.  Will plan to follow up AM CBC and monitor for further bleeding and any vital sign derangements  Warner Mccreedy, MD, MPH OB Fellow, Faculty Practice

## 2021-07-22 NOTE — Progress Notes (Signed)
Post Partum Day 1 Subjective: no complaints and tolerating PO Drowsy from magnesium Objective: Blood pressure 122/65, pulse 73, temperature 97.8 F (36.6 C), temperature source Axillary, resp. rate 15, last menstrual period 10/26/2020, SpO2 98 %, unknown if currently breastfeeding.  Physical Exam:  General: alert, cooperative, and no distress Lochia: appropriate Uterine Fundus: firm Incision:  DVT Evaluation: No evidence of DVT seen on physical exam.  Recent Labs    07/21/21 1057  HGB 10.2*  HCT 32.3*   CBC pending Assessment/Plan: Continue magnesium Check CBC Continue procardia, lasix today   LOS: 1 day   Lazaro Arms 07/22/2021, 7:23 AM

## 2021-07-23 MED ORDER — IBUPROFEN 600 MG PO TABS: 600.0000 mg | ORAL_TABLET | Freq: Four times a day (QID) | ORAL | 0 refills | Status: DC

## 2021-07-23 MED ORDER — NIFEDIPINE ER 60 MG PO TB24: 60.0000 mg | ORAL_TABLET | Freq: Every day | ORAL | 1 refills | Status: DC

## 2021-07-23 NOTE — Discharge Summary (Signed)
See discharge summary

## 2021-07-24 ENCOUNTER — Encounter: Payer: Self-pay | Admitting: Obstetrics and Gynecology

## 2021-07-25 LAB — SURGICAL PATHOLOGY

## 2021-07-28 ENCOUNTER — Telehealth: Payer: Self-pay | Admitting: *Deleted

## 2021-07-28 NOTE — Telephone Encounter (Signed)
Received a voicemail from 07/27/21 afternoon stating she had her baby last Friday and is calling because she has concerns regarding pain and swelling.  Caterine Mcmeans,RN I called Jennifer Holmes and she c/o numrous complaints. First we discussed pain in her lower pelvic , sometimes feels like cramping = 5 . We discussed since she is [id number] that she is likely having stronger cramps that are normal as her uterus is working to get small. I advised ibuprofen 600 mg every 6 hours she already has ordered. Next we discussed she feels sweaty sometimes. I asked if she has checked her temperature to see if she has a fever. She states she doesn't have a thermometer. Next we discussed she states she shakes at times. I asked if it is like because she has chills or like a seizure and if anyone saw it. She states her husband saw it and says like a seizure. I informed her she could be having a seizure because her blood pressure is high and she should go to the hospital now for evaluation. She also c/o increased swelling in her feet but doesn't think her face or hands are swollen. I again said this can indicate her bp is high and she should go to the hospital. She voices understanding. Ryon Layton,RN

## 2021-08-02 ENCOUNTER — Telehealth (HOSPITAL_COMMUNITY): Payer: Self-pay | Admitting: *Deleted

## 2021-08-02 NOTE — Telephone Encounter (Signed)
Left message to return nurse call.  Duffy Rhody, RN 08-02-2021 at 12:21pm

## 2021-08-02 NOTE — Telephone Encounter (Signed)
Mom reports feeling fine. No concerns about herself. PHQ2 = 0 in hospital. Refuses EPDS now. No issues with anxiety or depression per mom. Mom reports baby is well. No concerns about baby.  Duffy Rhody, RN 08-02-2021 at 12:29pm

## 2021-09-04 ENCOUNTER — Other Ambulatory Visit: Payer: Self-pay

## 2021-09-04 ENCOUNTER — Ambulatory Visit (INDEPENDENT_AMBULATORY_CARE_PROVIDER_SITE_OTHER): Payer: Self-pay | Admitting: Obstetrics and Gynecology

## 2021-09-04 ENCOUNTER — Encounter: Payer: Self-pay | Admitting: Obstetrics and Gynecology

## 2021-09-04 NOTE — Progress Notes (Signed)
Post Partum Visit Note  Jennifer Holmes is a 35 y.o. [phone] female who presents for a postpartum visit. She is 6 weeks postpartum following a normal spontaneous vaginal delivery.  I have fully reviewed the prenatal and intrapartum course. The delivery was at 38.2 gestational weeks.  Anesthesia: none. Postpartum course has been uncomplicated. Baby is doing well. Baby is feeding by breast. Bleeding staining only. Bowel function is normal. Bladder function is normal. Patient is not sexually active. Contraception method is none. Postpartum depression screening: negative.   The pregnancy intention screening data noted above was reviewed. Potential methods of contraception were discussed. The patient elected to proceed with nexplanon.   Edinburgh Postnatal Depression Scale - 09/04/21 1328       Edinburgh Postnatal Depression Scale:  In the Past 7 Days   I have been able to laugh and see the funny side of things. 0    I have looked forward with enjoyment to things. 0    I have blamed myself unnecessarily when things went wrong. 0    I have been anxious or worried for no good reason. 1    I have felt scared or panicky for no good reason. 1    Things have been getting on top of me. 0    I have been so unhappy that I have had difficulty sleeping. 0    I have felt sad or miserable. 1    I have been so unhappy that I have been crying. 1    The thought of harming myself has occurred to me. 0    Edinburgh Postnatal Depression Scale Total 4             Health Maintenance Due  Topic Date Due   COVID-19 Vaccine (1) Never done   INFLUENZA VACCINE  07/10/2021    The following portions of the patient's history were reviewed and updated as appropriate: allergies, current medications, past family history, past medical history, past social history, past surgical history, and problem list.  Review of Systems Pertinent items are noted in HPI.  Objective:  BP (!) 136/92   Pulse 67    Wt 198 lb 4.8 oz (89.9 kg)   LMP 10/26/2020   Breastfeeding Yes   BMI 37.47 kg/m    General:  alert, cooperative, and no distress   Breasts:  not indicated  Lungs: clear to auscultation bilaterally  Heart:  regular rate and rhythm  Abdomen: soft, non-tender; bowel sounds normal; no masses,  no organomegaly   Wound N/a  GU exam:   deferred       Assessment:    Encounter for postpartum exam  normal postpartum exam.   Plan:   Essential components of care per ACOG recommendations:  1.  Mood and well being: Patient with negative depression screening today. Reviewed local resources for support.  - Patient tobacco use? No.   - hx of drug use? No.    2. Infant care and feeding:  -Patient currently breastmilk feeding? Yes. Reviewed importance of draining breast regularly to support lactation.  -Social determinants of health (SDOH) reviewed in EPIC. No concerns.  3. Sexuality, contraception and birth spacing - Patient does not want a pregnancy in the next year.  Desired family size is 4 children.  - Reviewed forms of contraception in tiered fashion. Patient desired  nexplanon  today.   - Discussed birth spacing of 18 months  4. Sleep and fatigue -Encouraged family/partner/community support of 4 hrs of uninterrupted  sleep to help with mood and fatigue  5. Physical Recovery  - Discussed patients delivery and complications. She describes her labor as good. - Patient had a Vaginal, no problems at delivery. Patient had a 1st degree laceration. Perineal healing reviewed. Patient expressed understanding - Patient has urinary incontinence? No. - Patient is safe to resume physical and sexual activity  6.  Health Maintenance - HM due items addressed Yes - Last pap smear  Diagnosis  Date Value Ref Range Status  01/11/2021 - Atypical glandular cells, NOS (A)  Final  01/11/2021 (A)  Final   - Atypical squamous cells of undetermined significance (ASC-US)   Pap smear not done due to  financial issues at today's visit. Pt will get pap at [facility] or through the [facility] program -Breast Cancer screening indicated? No.   7. Chronic Disease/Pregnancy Condition follow up: Hypertension  - PCP follow up:  Pt advised to follow up in 1-2 months at community wellness center for continued surveillance of CHTN  Warden Fillers, MD Center for [employer], [facility]

## 2021-09-18 ENCOUNTER — Encounter: Payer: Self-pay | Admitting: *Deleted

## 2021-09-18 ENCOUNTER — Telehealth: Payer: Self-pay | Admitting: *Deleted

## 2021-09-18 MED ORDER — OMEPRAZOLE 20 MG PO CPDR: 20.0000 mg | DELAYED_RELEASE_CAPSULE | Freq: Every day | ORAL | 1 refills | Status: DC

## 2021-09-18 NOTE — Telephone Encounter (Signed)
Pt left VM message requesting refill of Omeprazole. I called pt and informed her that Rx has been sent. Since she has completed her PP visit, she will need to obtain future refills from her PCP. Pt voiced understanding.

## 2021-10-20 ENCOUNTER — Ambulatory Visit: Payer: Self-pay | Attending: Nurse Practitioner | Admitting: Nurse Practitioner

## 2021-10-20 ENCOUNTER — Encounter: Payer: Self-pay | Admitting: Nurse Practitioner

## 2021-10-20 ENCOUNTER — Other Ambulatory Visit: Payer: Self-pay

## 2021-10-20 VITALS — BP 116/82 | HR 71 | Resp 16 | Wt 198.4 lb

## 2021-10-20 DIAGNOSIS — I10 Essential (primary) hypertension: Secondary | ICD-10-CM

## 2021-10-20 DIAGNOSIS — Z23 Encounter for immunization: Secondary | ICD-10-CM

## 2021-10-20 MED ORDER — AMLODIPINE BESYLATE 5 MG PO TABS: 5.0000 mg | ORAL_TABLET | Freq: Every day | ORAL | 0 refills | Status: DC

## 2021-10-20 NOTE — Progress Notes (Signed)
Assessment & Plan:  [name] was seen today for hypertension.  Diagnoses and all orders for this visit:  Primary hypertension -     amLODipine (NORVASC) 5 MG tablet; Take 1 tablet (5 mg total) by mouth daily. -     CMP14+EGFR  Need for immunization against influenza -     Flu Vaccine QUAD 45moIM (Fluarix, Fluzone & Alfiuria Quad PF)   Patient has been counseled on age-appropriate routine health concerns for screening and prevention. These are reviewed and up-to-date. Referrals have been placed accordingly. Immunizations are up-to-date or declined.    Subjective:   Chief Complaint  Patient presents with   Hypertension   HPI Jennifer GLatanja Lehenbauer89y.o. female presents to office today for follow up to HTN  HTN Well controlled with nifedipine 60 mg daily. She endorses frequent headaches with taking. Will switch to amlodipine. She is currently breastfeeding and states she has not future plans to have any additional children.  BP Readings from Last 3 Encounters:  10/20/21 116/82  09/04/21 (!) 136/92  07/23/21 114/63     Review of Systems  Constitutional:  Negative for fever, malaise/fatigue and weight loss.  HENT: Negative.  Negative for nosebleeds.   Eyes: Negative.  Negative for blurred vision, double vision and photophobia.  Respiratory: Negative.  Negative for cough and shortness of breath.   Cardiovascular: Negative.  Negative for chest pain, palpitations and leg swelling.  Gastrointestinal: Negative.  Negative for heartburn, nausea and vomiting.  Musculoskeletal: Negative.  Negative for myalgias.  Neurological: Negative.  Negative for dizziness, focal weakness, seizures and headaches.  Psychiatric/Behavioral: Negative.  Negative for suicidal ideas.    Past Medical History:  Diagnosis Date   Gastroesophageal Reflux Disease (GERD)    Headache    HELLP syndrome    History of blood transfusion for PP hemorrhage    Postoperative Nausea and Vomiting (PONV)    Preterm  labor     Past Surgical History:  Procedure Laterality Date   CESAREAN SECTION N/A 03/01/2008    Family History  Problem Relation Age of Onset   Hypertension Mother    Heart disease Mother    Hypertension Brother     Social History Reviewed with no changes to be made today.   Outpatient Medications Prior to Visit  Medication Sig Dispense Refill   omeprazole (PRILOSEC) 20 MG capsule Take 1 capsule (20 mg total) by mouth daily. 30 capsule 1   NIFEdipine (ADALAT CC) 60 MG 24 hr tablet Take 1 tablet (60 mg total) by mouth daily. 30 tablet 1   No facility-administered medications prior to visit.    Allergies  Allergen Reactions   Lactose Intolerance (Gi) Other (See Comments)    Reaction:  GI upset       Objective:    BP 116/82   Pulse 71   Resp 16   Wt 198 lb 6.4 oz (90 kg)   SpO2 93%   BMI 37.49 kg/m  Wt Readings from Last 3 Encounters:  10/20/21 198 lb 6.4 oz (90 kg)  09/04/21 198 lb 4.8 oz (89.9 kg)  07/21/21 222 lb 8 oz (100.9 kg)    Physical Exam Vitals and nursing note reviewed.  Constitutional:      Appearance: She is well-developed.  HENT:     Head: Normocephalic and atraumatic.  Cardiovascular:     Rate and Rhythm: Normal rate and regular rhythm.     Heart sounds: Normal heart sounds. No murmur heard.   No friction rub.  No gallop.  Pulmonary:     Effort: Pulmonary effort is normal. No tachypnea or respiratory distress.     Breath sounds: Normal breath sounds. No decreased breath sounds, wheezing, rhonchi or rales.  Chest:     Chest wall: No tenderness.  Abdominal:     General: Bowel sounds are normal.     Palpations: Abdomen is soft.  Musculoskeletal:        General: Normal range of motion.     Cervical back: Normal range of motion.  Skin:    General: Skin is warm and dry.  Neurological:     Mental Status: She is alert and oriented to person, place, and time.     Coordination: Coordination normal.  Psychiatric:        Behavior: Behavior  normal. Behavior is cooperative.        Thought Content: Thought content normal.        Judgment: Judgment normal.         Patient has been counseled extensively about nutrition and exercise as well as the importance of adherence with medications and regular follow-up. The patient was given clear instructions to go to ER or return to medical center if symptoms don't improve, worsen or new problems develop. The patient verbalized understanding.   Follow-up: Return in about 3 weeks (around 11/10/2021) for BP CHECK WITH LUKE 3 weeks.see me in 3 months.   Gildardo Pounds, FNP-BC [facility] [place], [place]   10/20/2021, 8:26 PM

## 2021-10-21 LAB — CMP14+EGFR
ALT: 114 IU/L — ABNORMAL HIGH (ref 0–32)
AST: 87 IU/L — ABNORMAL HIGH (ref 0–40)
Albumin/Globulin Ratio: 1.6 (ref 1.2–2.2)
Albumin: 4.9 g/dL — ABNORMAL HIGH (ref 3.8–4.8)
Alkaline Phosphatase: 128 IU/L — ABNORMAL HIGH (ref 44–121)
BUN/Creatinine Ratio: 23 (ref 9–23)
BUN: 12 mg/dL (ref 6–20)
Bilirubin Total: 0.4 mg/dL (ref 0.0–1.2)
CO2: 24 mmol/L (ref 20–29)
Calcium: 9.6 mg/dL (ref 8.7–10.2)
Chloride: 105 mmol/L (ref 96–106)
Creatinine, Ser: 0.53 mg/dL — ABNORMAL LOW (ref 0.57–1.00)
Globulin, Total: 3.1 g/dL (ref 1.5–4.5)
Glucose: 106 mg/dL — ABNORMAL HIGH (ref 70–99)
Potassium: 4.4 mmol/L (ref 3.5–5.2)
Sodium: 145 mmol/L — ABNORMAL HIGH (ref 134–144)
Total Protein: 8 g/dL (ref 6.0–8.5)
eGFR: 125 mL/min/{1.73_m2} (ref >59)

## 2021-11-08 ENCOUNTER — Other Ambulatory Visit: Payer: Medicaid Other | Admitting: *Deleted

## 2021-11-08 ENCOUNTER — Other Ambulatory Visit: Payer: Self-pay

## 2021-11-08 DIAGNOSIS — Z124 Encounter for screening for malignant neoplasm of cervix: Secondary | ICD-10-CM

## 2021-11-08 NOTE — Addendum Note (Signed)
Addended by: Narda Rutherford on: 11/08/2021 12:06 PM   Modules accepted: Orders

## 2021-11-08 NOTE — Progress Notes (Signed)
Patient: Jennifer Holmes           Date of Birth: 18-Jan-1986           MRN: [id number] Visit Date: 11/08/2021 PCP: Claiborne Rigg, NP  Cervical Cancer Screening Do you smoke?: No Have you ever had or been told you have an allergy to latex products?: Yes Marital status: Married Date of last pap smear: Within the last year (01/11/2021 AGUS/ ASC-US/ HPV-Negative, PERvious 07/15/2014-Negative) Number of pregnancies: 4 Number of births: 4 Have you ever had any of the following? Hysterectomy: No Tubal ligation (tubes tied): No Abnormal bleeding: No Abnormal pap smear: Yes Venereal warts: No A sex partner with venereal warts: No A high risk* sex partner: No  Cervical Exam  Abnormal Observations: Cervix friable. Recommendations: Last Pap smear was 01/11/2021 at [facility] and abnormal-AGUS and ASCUS with negative HPV. Due to patients Pap smear being AGUS a colposcopy was recommended for follow-up. Patient was currently pregnant and did not have any follow-up. Per patient has no history of an abnormal Pap smear prior to her most recent Pap smear. Last Pap smear is available in Epic. Let patient know will follow-up with her within the next couple of weeks with results of her Pap smear by phone. Informed patient that follow-up for today's Pap smear will be based on the result due to her last Pap smear was abnormal. Patient verbalized understanding.     Patient's History Patient Active Problem List   Diagnosis Date Noted   Encounter for postpartum visit 09/04/2021   Preeclampsia 07/21/2021   Vaginal delivery 07/21/2021   ASCUS of cervix with negative high risk HPV 02/08/2021   Atypical glandular cells of undetermined significance (AGUS) on cervical Pap smear 02/08/2021   Supervision of high risk pregnancy, antepartum 01/11/2021   History of preterm delivery 01/11/2021   Hx successful VBAC (vaginal birth after cesarean), currently pregnant 01/11/2021    History of cesarean delivery 01/11/2021   Vitamin D deficiency 07/11/2020   History of severe pre-eclampsia 01/27/2015   Chronic hypertension affecting pregnancy    Hepatic steatosis 08/23/2012   Past Medical History:  Diagnosis Date   Gastroesophageal Reflux Disease (GERD)    Headache    HELLP syndrome    History of blood transfusion for PP hemorrhage    Postoperative Nausea and Vomiting (PONV)    Preterm labor     Family History  Problem Relation Age of Onset   Hypertension Mother    Heart disease Mother    Hypertension Brother     Social History   Occupational History   Not on file  Tobacco Use   Smoking status: Never   Smokeless tobacco: Never  Vaping Use   Vaping Use: Never used  Substance and Sexual Activity   Alcohol use: No   Drug use: No   Sexual activity: Yes    Birth control/protection: None

## 2021-11-10 ENCOUNTER — Ambulatory Visit: Payer: Self-pay | Admitting: Pharmacist

## 2021-11-15 LAB — CYTOLOGY - PAP
Comment: NEGATIVE
Diagnosis: UNDETERMINED — AB
High risk HPV: NEGATIVE

## 2021-11-20 ENCOUNTER — Telehealth: Payer: Self-pay

## 2021-11-20 NOTE — Telephone Encounter (Signed)
11/17/2021, Via Delorise Royals, Spanish Interpreter [facility]), Patient informed Pap results, ASC-US with negative HPV, repeat pap in 1 year. Patient verbalized understanding.

## 2021-12-27 ENCOUNTER — Ambulatory Visit: Payer: Self-pay | Admitting: *Deleted

## 2021-12-27 ENCOUNTER — Other Ambulatory Visit: Payer: Self-pay

## 2021-12-27 ENCOUNTER — Ambulatory Visit: Payer: Self-pay | Admitting: Physician Assistant

## 2021-12-27 DIAGNOSIS — U071 COVID-19: Secondary | ICD-10-CM

## 2021-12-27 DIAGNOSIS — Z20822 Contact with and (suspected) exposure to covid-19: Secondary | ICD-10-CM

## 2021-12-27 LAB — POC COVID19 BINAXNOW: SARS Coronavirus 2 Ag: POSITIVE — AB

## 2021-12-27 NOTE — Patient Instructions (Addendum)
Your rapid COVID test was POSITIVE  I encourage you to reach out to your pediatrician to discuss proper treatment, if any, for your younger children and infant.  I encourage you to get plenty of rest, stay well-hydrated.  And you can use over-the-counter symptomatic treatment that is appropriate for breast-feeding mothers.  You should continue to self isolate until you have been without a elevated temperature for 3 days or 5 days since your symptoms began whichever comes first.  You should then continue to wear a mask around others for 5 more days.  I hope that you feel better soon, please let us know if there is any else we can do for you   Kennieth Rad, PA-C Physician [place] [email or web address]  COVID-19: Sander Nephew hacer si est enfermo COVID-19: What to Do if You Are Sick Si obtiene un resultado positivo en la prueba y es un adulto mayor o alguien que tiene un alto riesgo de enfermarse de gravedad a causa del COVID-19, puede haber tratamiento disponible. Comunquese de inmediato con un mdico despus de una prueba con resultado positivo para determinar si es elegible para recibir tratamiento, incluso si sus sntomas son leves en ese momento. Tambin puede acudir a Doctor, general practice de prueba de deteccin y tratamiento (Test to Treat) y, si es elegible, recibir una receta de un mdico. No se demore: El tratamiento debe iniciarse en los primeros das para que sea [place]. Si tiene [place], tos u otros sntomas, podra tener COVID-19. [place] [employer] de las personas tiene una enfermedad leve y puede Fish farm manager. Si est enfermo: Lleve un registro de los sntomas. Si tiene una seal de advertencia de emergencia (que incluye dificultad para respirar), llame al 911. Pasos para ayudar a Manufacturing engineer del COVID-19 si est enfermo Si tiene COVID-19 o piensa que podra [employer], siga los pasos a continuacin para  cuidarse y Air traffic controller a Museum/gallery curator a Producer, television/film/video de su hogar y su comunidad. Qudese en su casa, excepto para obtener atencin mdica [employer]. [employer] de las personas con COVID-19 tiene una enfermedad leve y puede recuperarse en el hogar sin atencin mdica. No salga de su casa, excepto para recibir atencin mdica. No vaya a lugares pblicos ni a otros lugares donde no Community education officer. Cudese. Descanse y mantngase hidratado. Tome medicamentos de venta libre, como paracetamol, para sentirse mejor. Mantngase en contacto con su mdico. Llame antes de recibir atencin mdica. Asegrese de recibir atencin si tiene dificultad para Ambulance person, o si tiene algn otro signo de advertencia de emergencia, o si cree que se trata de Engineer, maintenance (IT). Evite el transporte pblico, compartir vehculos o tomar taxis, si es posible. Somtase a pruebas de Programme researcher, broadcasting/film/video Si tiene sntomas de COVID-19, somtase a pruebas de deteccin. Mientras espera los resultados de las pruebas, aljese de otras personas, incluso mantngase apartado de las personas que viven en su hogar. Hgase las pruebas lo antes posible despus de que comiencen sus sntomas. Puede haber tratamientos disponibles para personas con COVID-19 que estn en riesgo de enfermar gravemente. No se demore: El tratamiento debe iniciarse de forma temprana para que sea eficaz; algunos tratamientos deben comenzar en el lapso de los 5 das siguientes a los primeros sntomas. Si el resultado de su prueba es positivo, comunquese de inmediato con su mdico para determinar si es elegible. Las pruebas de autodiagnstico son Ardelia Mems de las diversas opciones para Optometrist pruebas de deteccin del virus que causa el COVID-86 y  pueden ser ms convenientes que las pruebas de laboratorio y las pruebas en los puntos de atencin. Consulte a su mdico o al departamento de salud local si necesita ayuda para interpretar los [employer]. Puede visitar el sitio web  de su departamento de Hotel manager, tribal, local y territorial para Animator informacin local ms reciente sobre las pruebas de Programme researcher, broadcasting/film/video. Seprese de [employer] En la mayor medida posible, permanezca en una habitacin especfica y alejado de otras personas y Herbalist. Si es posible, use un bao aparte. Si tiene que Agricultural consultant de otras personas o animales dentro o fuera de la casa, use una mascarilla bien [place]. Dgales a sus contactos estrechos que pueden haber estado expuestos al COVID-19. Ardelia Mems persona infectada puede propagar el COVID-19 a Proofreader de 53 horas (o 2 das) antes de que la persona presente cualquier sntoma o tenga un resultado positivo en la prueba de deteccin. Al informar a sus contactos estrechos que pueden haber estado expuestos al COVID-19, est ayudando a proteger a todos. Consulte COVID-19 y los animales si tiene preguntas [employer]. Si se le diagnostica COVID-19, alguien del departamento de Chartered certified accountant. Responda la llamada para Printmaker. Controle sus sntomas Los sntomas de COVID-19 incluyen [place], tos u otros sntomas. Siga las instrucciones del mdico y del departamento de salud local. Las autoridades de salud locales podrn darle instrucciones para Chief Technology Officer sus sntomas y Doctor, hospital informacin. Cundo solicitar atencin mdica de emergencia Est atento a los signos de advertencia de emergencia* para el COVID-19. Si alguien [place] alguno de estos signos, solicite atencin mdica de Freight forwarder de inmediato: Building services engineer u opresin persistentes en el pecho Confusin nueva Incapacidad de despertarse o permanecer despierto Piel, labios o lechos de las uas plidos o de color grisceo o [place], segn el tono de la piel *En esta lista no estn todos los sntomas posibles. Llame al mdico si tiene otros sntomas que sean graves o le preocupen. Llame al 911 o llame con anticipacin al centro de  emergencias de su localidad: Informe al operador que est buscando atencin para alguien que tiene o puede tener COVID-19. Llame con anticipacin antes de visitar al mdico Llame con anticipacin. Muchas visitas mdicas para la atencin de rutina se estn posponiendo o se realizan por telfono o telemedicina. Si tiene una cita mdica que no se puede posponer, llame al [employer] del mdico e informe que tiene o puede tener COVID-19. Esto ayudar a que quienes trabajan en el consultorio puedan protegerse y Museum/gallery curator a Information systems manager. Si est enfermo, use una Loss adjuster, chartered Debe usar una mascarilla si debe estar cerca de otras personas o [place], incluidas las mascotas (incluso en su casa). Lleve una mascarilla con el mejor ajuste, proteccin y comodidad para usted. No es necesario que use la mascarilla si est solo. Si no puede ponerse Geographical information systems officer (debido a que tiene dificultad para Ambulance person, por ejemplo), cbrase de algn otro modo cuando tosa o estornude. Trate de mantenerse al menos a una distancia de 6 pies (1.8 m) de las Comptroller. Esto ayudar a proteger a las personas que lo rodean. Los nios menores de 2 aos de edad, las personas con problemas para Ambulance person o las personas que no pueden quitarse las mascarillas sin ayuda no deben Cytogeneticist. Cbrase al toser y estornudar Cbrase la boca y la nariz con un pauelo descartable cuando tosa o estornude. Arroje los pauelos descartables usados en un cesto de basura que  tenga Nicoletta Ba. Inmediatamente, lvese las manos con agua y jabn durante al menos 20 segundos. Si no dispone de agua y [place], Google las manos con un desinfectante de manos a base de alcohol que contenga al menos un 60 % de alcohol. Lmpiese las manos con frecuencia Lvese las manos frecuentemente con agua y jabn durante al menos 20 segundos. Esto es especialmente importante despus de sonarse la nariz, toser o estornudar, ir al bao y antes de comer o  preparar alimentos. Utilice un desinfectante para manos si no dispone de [place] y [place]. Use un desinfectante para manos a base de alcohol con al menos un 60 % de alcohol, cubrindose todas las superficies de las manos y frotndoselas hasta que se sientan secas. Usar agua y jabn es la mejor opcin, especialmente si las manos estn sucias. No se toque los ojos, la nariz ni la boca sin antes lavarse las manos. Consejos para el lavado de manos Evite compartir artculos domsticos personales No comparta platos, vasos, tazas, utensilios para comer, toallas ni ropa de cama con otras personas en su casa. Despus de [employer], lvelos bien con agua y jabn o pngalos en el lavavajillas. Limpie todas las superficies de su casa Limpie y desinfecte las superficies de alto contacto (por ejemplo, picaportes de [place], mesas, [place], interruptores de luz y encimeras) en su habitacin de enfermo y bao. En espacios compartidos, debe limpiar y [place] y los artculos despus de que los use la persona enferma. Si est enfermo y no puede limpiar, un cuidador u otra persona debe limpiar y Education officer, environmental solo el lugar que le rodea (como su dormitorio y bao) segn sea necesario. Su cuidador/otra persona debe Sales promotion account executive posible (al menos varias horas) y llevar mascarilla antes de entrar, limpiar y desinfectar los espacios compartidos que usted use. Limpie y desinfecte las zonas que puedan tener [place], heces o lquidos corporales en su superficie. Use limpiadores y desinfectantes domsticos. Limpie las superficies con suciedad visible con limpiadores domsticos que contengan jabn o detergente. Luego, use un desinfectante de Fish farm manager. Use un producto de la lista N de Conservation officer, historic buildings (EPA) (Agencia de Proteccin Ambiental): Desinfectantes para coronavirus ([id number]). No olvide seguir las instrucciones de la etiqueta para asegurarse de usar el producto de  Geographical information systems officer segura y Armed forces logistics/support/administrative officer. Para muchos productos, se recomienda Consulting civil engineer superficie hmeda con un desinfectante durante un cierto perodo de tiempo (consulte el tiempo de Diplomatic Services operational officer en la etiqueta del producto). Es posible que tambin deba usar equipo de Engineer, maintenance personal, como guantes, dependiendo de las instrucciones de la etiqueta del producto. Inmediatamente despus de la desinfeccin, lvese las manos con agua y jabn durante 20 segundos. Para obtener orientacin completa sobre la limpieza y desinfeccin de su hogar, consulte la Orientacin para la desinfeccin [place]. Tome medidas para mejorar la ventilacin en su casa Mejore la ventilacin (el flujo de aire) en casa para ayudar a evitar el contagio del COVID-19 a Producer, television/film/video de Administrator, arts. Despeje las partculas del virus del COVID-19 en el aire abriendo las [place], utilizando filtros de aire y encendiendo los ventiladores de Administrator, arts. Use esta herramienta interactiva para aprender a mejorar el flujo de aire en su hogar. Cundo puede estar cerca de otras personas despus de tener COVID-19 Decidir cundo puede rodearse de otras personas es diferente para distintas situaciones. Averige cundo puede finalizar el aislamiento en casa de forma segura. Para cualquier pregunta adicional sobre su atencin, pngase en contacto con su mdico o  con el departamento de salud local o estatal. 02/28/2021 Derl Barrow del contenido: [facility] for Immunization and Respiratory Diseases ([facility]), Division of Viral Diseases ([place] de [place] y [place]) Esta informacin no tiene Marine scientist el consejo del mdico. Asegrese de hacerle al mdico cualquier pregunta que tenga. Document Revised: 03/21/2021 Document Reviewed: 08/18/2021 Elsevier Patient Education  2022 [employer].

## 2021-12-27 NOTE — Progress Notes (Signed)
Established Patient Office Visit  Subjective:  Patient ID: Bita Cartwright, female    DOB: 23-Jan-1986  Age: 36 y.o. MRN: [id number]  CC:  Chief Complaint  Patient presents with   Covid Exposure   Virtual Visit via Telephone Note  I connected with Aundrea Jeneen Montgomery on 12/27/21 at  5:00 PM EST by telephone and verified that I am speaking with the correct person using two identifiers.  Location: Patient: Car  Provider: [facility] Medicine Unit    I discussed the limitations, risks, security and privacy concerns of performing an evaluation and management service by telephone and the availability of in person appointments. I also discussed with the patient that there may be a patient responsible charge related to this service. The patient expressed understanding and agreed to proceed.   History of Present Illness: States that she started feeling poorly approximately 3 days ago.  Reports that she has been having a productive cough with yellow sputum, yellow nasal discharge, measured temperature of 100.4, with last reading last night.  States that she is eating and drinking okay.  Reports that she has been using ibuprofen and Tylenol with moderate relief.  States that she is currently breast-feeding.  States everyone in her home has similar symptoms.  Reports that she has had 2 previous COVID vaccines with the last one in May 2021.  No recent flu shot.    Observations/Objective: Medical history and current medications reviewed, no physical exam completed  Past Medical History:  Diagnosis Date   Gastroesophageal Reflux Disease (GERD)    Headache    HELLP syndrome    History of blood transfusion for PP hemorrhage    Postoperative Nausea and Vomiting (PONV)    Preterm labor     Past Surgical History:  Procedure Laterality Date   CESAREAN SECTION N/A 03/01/2008    Family History  Problem Relation Age of Onset   Hypertension Mother    Heart disease  Mother    Hypertension Brother     Social History   Socioeconomic History   Marital status: Married    Spouse name: Not on file   Number of children: Not on file   Years of education: Not on file   Highest education level: Not on file  Occupational History   Not on file  Tobacco Use   Smoking status: Never   Smokeless tobacco: Never  Vaping Use   Vaping Use: Never used  Substance and Sexual Activity   Alcohol use: No   Drug use: No   Sexual activity: Yes    Birth control/protection: None  Other Topics Concern   Not on file  Social History Narrative   Not on file   Social Determinants of Health   Financial Resource Strain: Not on file  Food Insecurity: No Food Insecurity   Worried About Running Out of Food in the Last Year: Never true   Ran Out of Food in the Last Year: Never true  Transportation Needs: No Transportation Needs   Lack of Transportation (Medical): No   Lack of Transportation (Non-Medical): No  Physical Activity: Not on file  Stress: Not on file  Social Connections: Not on file  Intimate Partner Violence: Not on file    Outpatient Medications Prior to Visit  Medication Sig Dispense Refill   amLODipine (NORVASC) 5 MG tablet Take 1 tablet (5 mg total) by mouth daily. 90 tablet 0   omeprazole (PRILOSEC) 20 MG capsule Take 1 capsule (20 mg total) by  mouth daily. 30 capsule 1   No facility-administered medications prior to visit.    Allergies  Allergen Reactions   Latex Itching   Lactose Intolerance (Gi) Other (See Comments)    Reaction:  GI upset    ROS Review of Systems  Constitutional:  Positive for chills and fever.  HENT:  Positive for congestion and rhinorrhea. Negative for ear discharge, ear pain, sinus pressure, sinus pain, sore throat and trouble swallowing.   Eyes: Negative.   Respiratory:  Positive for cough. Negative for shortness of breath and wheezing.   Cardiovascular:  Negative for chest pain.  Gastrointestinal:  Negative for  abdominal pain, diarrhea, nausea and vomiting.  Endocrine: Negative.   Genitourinary: Negative.   Musculoskeletal:  Negative for myalgias.  Skin: Negative.   Allergic/Immunologic: Negative.   Neurological: Negative.   Hematological: Negative.   Psychiatric/Behavioral: Negative.       Objective:      There were no vitals taken for this visit. Wt Readings from Last 3 Encounters:  10/20/21 198 lb 6.4 oz (90 kg)  09/04/21 198 lb 4.8 oz (89.9 kg)  07/21/21 222 lb 8 oz (100.9 kg)     Health Maintenance Due  Topic Date Due   COVID-19 Vaccine (1) Never done    There are no preventive care reminders to display for this patient.  Lab Results  Component Value Date   TSH 1.890 07/07/2020   Lab Results  Component Value Date   WBC 12.2 (H) 07/22/2021   HGB 9.3 (L) 07/22/2021   HCT 29.0 (L) 07/22/2021   MCV 74.9 (L) 07/22/2021   PLT 357 07/22/2021   Lab Results  Component Value Date   NA 145 (H) 10/20/2021   K 4.4 10/20/2021   CO2 24 10/20/2021   GLUCOSE 106 (H) 10/20/2021   BUN 12 10/20/2021   CREATININE 0.53 (L) 10/20/2021   BILITOT 0.4 10/20/2021   ALKPHOS 128 (H) 10/20/2021   AST 87 (H) 10/20/2021   ALT 114 (H) 10/20/2021   PROT 8.0 10/20/2021   ALBUMIN 4.9 (H) 10/20/2021   CALCIUM 9.6 10/20/2021   ANIONGAP 8 07/21/2021   EGFR 125 10/20/2021   Lab Results  Component Value Date   CHOL 167 01/21/2020   Lab Results  Component Value Date   HDL 43 01/21/2020   Lab Results  Component Value Date   LDLCALC 91 01/21/2020   Lab Results  Component Value Date   TRIG 196 (H) 01/21/2020   Lab Results  Component Value Date   CHOLHDL 3.9 01/21/2020   Lab Results  Component Value Date   HGBA1C 5.5 01/11/2021      Assessment & Plan:   Problem List Items Addressed This Visit   None Visit Diagnoses     Suspected 2019 novel coronavirus infection    -  Primary   Relevant Orders   POC COVID-19 (Completed)   COVID-19           No orders of the defined  types were placed in this encounter.   Assessment and Plan: 1. COVID-19 Patient evaluated, tested and sent home with instructions for home care and Quarantine. Instructed to seek further care if symptoms worsen.    2. Suspected 2019 novel coronavirus infection  - POC COVID-19   Follow Up Instructions:    I discussed the assessment and treatment plan with the patient. The patient was provided an opportunity to ask questions and all were answered. The patient agreed with the plan and demonstrated an understanding  of the instructions.   The patient was advised to call back or seek an in-person evaluation if the symptoms worsen or if the condition fails to improve as anticipated.  I provided 19 minutes of non-face-to-face time during this encounter.    Loraine Grip Mayers, PA-C

## 2021-12-27 NOTE — Telephone Encounter (Signed)
Summary: fever (103.00) cough,body aches,congestion,dizziness   No appointments available.   ----- Message from Candee Furbish sent at 12/27/2021 12:48 PM EST -----  Pt stated 2 days ago she started with fever (as of today 103.00) cough,body aches,congestion,dizziness.  Pt unsure if this may be COVID.   Pt seeking clinical advice.   Pt needs a Engineer, structural.       Called patient via interpreter [place], [place] #[id number].  Chief Complaint: covid symptoms requesting advise. Has not taken covid test  Symptoms: fever, cough, body aches, dizziness, congestion. Shortness of breath after coughing, Breast feeding  Frequency: 2 days ago  Pertinent Negatives: Patient denies chest pain difficulty breathing . Disposition: [] ED /[x] Urgent Care (no appt availability in office) / [] Appointment(In office/virtual)/ []  [facility]/ [] Home Care/ [] Refused Recommended Disposition /[x] [facility]/ []  Follow-up with PCP Additional Notes:  Recommended covid testing at pharmacy or UC/ED. Recommended mobile bus . Patient is breast feeding and recommended to call pediatrician for advise .  Patient verbalized understanding if symptoms worsen go to ED.     Reason for Disposition  [1] COVID-19 infection suspected by caller or triager AND [2] mild symptoms (cough, fever, or others) AND [3] negative COVID-19 rapid test    Has not been tested for covid at this time  Answer Assessment - Initial Assessment Questions 1. COVID-19 DIAGNOSIS: "Who made your COVID-19 diagnosis?" "Was it confirmed by a positive lab test or self-test?" If not diagnosed by a doctor (or NP/PA), ask "Are there lots of cases (community spread) where you live?" Note: See public health department website, if unsure.     Has not been tested  2. COVID-19 EXPOSURE: "Was there any known exposure to COVID before the symptoms began?" CDC Definition of close contact: within 6 feet (2 meters) for a total of 15 minutes or more over a  24-hour period.      na 3. ONSET: "When did the COVID-19 symptoms start?"      2 days ago  4. WORST SYMPTOM: "What is your worst symptom?" (e.g., cough, fever, shortness of breath, muscle aches)     Fever, cough body aches , dizziness, congestion 5. COUGH: "Do you have a cough?" If Yes, ask: "How bad is the cough?"       Yes  6. FEVER: "Do you have a fever?" If Yes, ask: "What is your temperature, how was it measured, and when did it start?"     Yes 100.3 earlier but coming down after taking acetaminophen 7. RESPIRATORY STATUS: "Describe your breathing?" (e.g., shortness of breath, wheezing, unable to speak)      Shortness of breath after coughing 8. BETTER-SAME-WORSE: "Are you getting better, staying the same or getting worse compared to yesterday?"  If getting worse, ask, "In what way?"     na 9. HIGH RISK DISEASE: "Do you have any chronic medical problems?" (e.g., asthma, heart or lung disease, weak immune system, obesity, etc.)     Hx HTN 10. VACCINE: "Have you had the COVID-19 vaccine?" If Yes, ask: "Which one, how many shots, when did you get it?"        11. BOOSTER: "Have you received your COVID-19 booster?" If Yes, ask: "Which one and when did you get it?"       na 12. PREGNANCY: "Is there any chance you are pregnant?" "When was your last menstrual period?"       na 13. OTHER SYMPTOMS: "Do you have any other symptoms?"  (e.g.,  chills, fatigue, headache, loss of smell or taste, muscle pain, sore throat)       Fever cough body aches dizziness, congestion 14. O2 SATURATION MONITOR:  "Do you use an oxygen saturation monitor (pulse oximeter) at home?" If Yes, ask "What is your reading (oxygen level) today?" "What is your usual oxygen saturation reading?" (e.g., 95%)       na  Protocols used: Coronavirus (COVID-19) Diagnosed or Suspected-A-AH

## 2022-08-23 ENCOUNTER — Ambulatory Visit
Admission: EM | Admit: 2022-08-23 | Discharge: 2022-08-23 | Disposition: A | Discharge status: Home / Self Care | Payer: BC Managed Care – PPO | Attending: Emergency Medicine | Admitting: Emergency Medicine

## 2022-08-23 DIAGNOSIS — B86 Scabies: Secondary | ICD-10-CM | POA: Diagnosis not present

## 2022-08-23 MED ORDER — PERMETHRIN 5 % EX CREA: TOPICAL_CREAM | CUTANEOUS | 1 refills | Status: DC

## 2022-08-23 NOTE — ED Triage Notes (Signed)
The patient states about a week ago she began having a rash to her lower extremities. The patient states the areas do itch.

## 2022-08-23 NOTE — ED Provider Notes (Signed)
[facility]    CSN: [id number] Arrival date & time: 08/23/22  1211    HISTORY   Chief Complaint  Patient presents with   Rash   HPI Jennifer Holmes is a pleasant, 36 y.o. female who presents to urgent care today. Patient complains of a 1 week history of itchy rash on both of her lower extremities.  States that initially the rash started on her left foot and is continued to spread up her left leg and to her right lower leg as well.  Patient states she has tried Benadryl cream and mupirocin with minimal relief of her symptoms.  Patient states she does not have carpet or pets in her home.  The history is provided by the patient.   Past Medical History:  Diagnosis Date   Gastroesophageal Reflux Disease (GERD)    Headache    HELLP syndrome    History of blood transfusion for PP hemorrhage    Postoperative Nausea and Vomiting (PONV)    Preterm labor    Patient Active Problem List   Diagnosis Date Noted   Encounter for postpartum visit 09/04/2021   Preeclampsia 07/21/2021   Vaginal delivery 07/21/2021   ASCUS of cervix with negative high risk HPV 02/08/2021   Atypical glandular cells of undetermined significance (AGUS) on cervical Pap smear 02/08/2021   Supervision of high risk pregnancy, antepartum 01/11/2021   History of preterm delivery 01/11/2021   Hx successful VBAC (vaginal birth after cesarean), currently pregnant 01/11/2021   History of cesarean delivery 01/11/2021   Vitamin D deficiency 07/11/2020   History of severe pre-eclampsia 01/27/2015   Chronic hypertension affecting pregnancy    Hepatic steatosis 08/23/2012   Past Surgical History:  Procedure Laterality Date   CESAREAN SECTION N/A 03/01/2008   OB History     Gravida  4   Para  4   Term  2   Preterm  2   AB  0   Living  4      SAB  0   IAB  0   Ectopic  0   Multiple  0   Live Births  4        Obstetric Comments  Had a blood transfusion with first pregnancy after  her C/S in [place].        Home Medications    Prior to Admission medications   Not on File    Family History Family History  Problem Relation Age of Onset   Hypertension Mother    Heart disease Mother    Hypertension Brother    Social History Social History   Tobacco Use   Smoking status: Never   Smokeless tobacco: Never  Vaping Use   Vaping Use: Never used  Substance Use Topics   Alcohol use: No   Drug use: No   Allergies   Latex and Lactose intolerance (gi)  Review of Systems Review of Systems Pertinent findings revealed after performing a 14 point review of systems has been noted in the history of present illness.  Physical Exam Triage Vital Signs ED Triage Vitals  Enc Vitals Group     BP 10/06/21 0827 (!) 147/82     Pulse Rate 10/06/21 0827 72     Resp 10/06/21 0827 18     Temp 10/06/21 0827 98.3 F (36.8 C)     Temp Source 10/06/21 0827 Oral     SpO2 10/06/21 0827 98 %     Weight --  Height --      Head Circumference --      Peak Flow --      Pain Score 10/06/21 0826 5     Pain Loc --      Pain Edu? --      Excl. in GC? --   No data found.  Updated Vital Signs BP 123/80 (BP Location: Left Arm)   Pulse 83   Temp 98.3 F (36.8 C) (Oral)   Resp 18   SpO2 96%   Breastfeeding Yes Comment: patient states she has not had a period in a year she is breastfeeding  Physical Exam Vitals and nursing note reviewed.  Constitutional:      General: She is not in acute distress.    Appearance: Normal appearance.  HENT:     Head: Normocephalic and atraumatic.  Eyes:     Pupils: Pupils are equal, round, and reactive to light.  Cardiovascular:     Rate and Rhythm: Normal rate and regular rhythm.  Pulmonary:     Effort: Pulmonary effort is normal.     Breath sounds: Normal breath sounds.  Musculoskeletal:        General: Normal range of motion.     Cervical back: Normal range of motion and neck supple.  Skin:    General: Skin is warm and dry.      Findings: Rash (Erythematous papular rash on both feet radiating up lower extremities in a linear pattern) present.  Neurological:     General: No focal deficit present.     Mental Status: She is alert and oriented to person, place, and time. Mental status is at baseline.  Psychiatric:        Mood and Affect: Mood normal.        Behavior: Behavior normal.        Thought Content: Thought content normal.        Judgment: Judgment normal.     Visual Acuity Right Eye Distance:   Left Eye Distance:   Bilateral Distance:    Right Eye Near:   Left Eye Near:    Bilateral Near:     UC Couse / Diagnostics / Procedures:     Radiology No results found.  Procedures Procedures (including critical care time) EKG  Pending results:  Labs Reviewed - No data to display  Medications Ordered in UC: Medications - No data to display  UC Diagnoses / Final Clinical Impressions(s)   I have reviewed the triage vital signs and the nursing notes.  Pertinent labs & imaging results that were available during my care of the patient were reviewed by me and considered in my medical decision making (see chart for details).    Final diagnoses:  Scabies   Patient provided with permethrin cream that she can use once every 14 days for 2 doses.  Return precautions advised.  ED Prescriptions     Medication Sig Dispense Auth. Provider   permethrin (ELIMITE) 5 % cream Apply cream from head to feet, leave on for 8 to 14 hours, then wash with soap/water, repeat application if living mites present 14 days after initial treatment 60 g Theadora Rama Scales, PA-C      PDMP not reviewed this encounter.  Pending results:  Labs Reviewed - No data to display  Discharge Instructions:   Discharge Instructions      I have provided you with a prescription for permethrin cream.  Please use as directed.  You can repeat in 14 days  if the rash returns.  Thank you for visiting urgent care  today.      Disposition Upon Discharge:  Condition: stable for discharge home  Patient presented with an acute illness with associated systemic symptoms and significant discomfort requiring urgent management. In my opinion, this is a condition that a prudent lay person (someone who possesses an average knowledge of health and medicine) may potentially expect to result in complications if not addressed urgently such as respiratory distress, impairment of bodily function or dysfunction of bodily organs.   Routine symptom specific, illness specific and/or disease specific instructions were discussed with the patient and/or caregiver at length.   As such, the patient has been evaluated and assessed, work-up was performed and treatment was provided in alignment with urgent care protocols and evidence based medicine.  Patient/parent/caregiver has been advised that the patient may require follow up for further testing and treatment if the symptoms continue in spite of treatment, as clinically indicated and appropriate.  Patient/parent/caregiver has been advised to return to the [facility] or PCP if no better; to PCP or the Emergency Department if new signs and symptoms develop, or if the current signs or symptoms continue to change or worsen for further workup, evaluation and treatment as clinically indicated and appropriate  The patient will follow up with their current PCP if and as advised. If the patient does not currently have a PCP we will assist them in obtaining one.   The patient may need specialty follow up if the symptoms continue, in spite of conservative treatment and management, for further workup, evaluation, consultation and treatment as clinically indicated and appropriate.   Patient/parent/caregiver verbalized understanding and agreement of plan as discussed.  All questions were addressed during visit.  Please see discharge instructions below for further details of plan.  This office note  has been dictated using Teaching laboratory technician.  Unfortunately, this method of dictation can sometimes lead to typographical or grammatical errors.  I apologize for your inconvenience in advance if this occurs.  Please do not hesitate to reach out to me if clarification is needed.      Theadora Rama Scales, PA-C 08/23/22 1406

## 2022-08-23 NOTE — Discharge Instructions (Addendum)
I have provided you with a prescription for permethrin cream.  Please use as directed.  You can repeat in 14 days if the rash returns.  Thank you for visiting urgent care today.

## 2022-10-15 ENCOUNTER — Ambulatory Visit
Admission: EM | Admit: 2022-10-15 | Discharge: 2022-10-15 | Disposition: A | Discharge status: Home / Self Care | Payer: BC Managed Care – PPO | Attending: Urgent Care | Admitting: Urgent Care

## 2022-10-15 DIAGNOSIS — R0602 Shortness of breath: Secondary | ICD-10-CM | POA: Diagnosis present

## 2022-10-15 DIAGNOSIS — R0789 Other chest pain: Secondary | ICD-10-CM

## 2022-10-15 DIAGNOSIS — B349 Viral infection, unspecified: Secondary | ICD-10-CM

## 2022-10-15 DIAGNOSIS — R52 Pain, unspecified: Secondary | ICD-10-CM

## 2022-10-15 DIAGNOSIS — I10 Essential (primary) hypertension: Secondary | ICD-10-CM | POA: Diagnosis not present

## 2022-10-15 DIAGNOSIS — Z1152 Encounter for screening for COVID-19: Secondary | ICD-10-CM | POA: Diagnosis not present

## 2022-10-15 DIAGNOSIS — Z79899 Other long term (current) drug therapy: Secondary | ICD-10-CM | POA: Diagnosis not present

## 2022-10-15 LAB — POCT URINALYSIS DIP (MANUAL ENTRY)
Bilirubin, UA: NEGATIVE
Glucose, UA: NEGATIVE mg/dL
Ketones, POC UA: NEGATIVE mg/dL
Leukocytes, UA: NEGATIVE
Nitrite, UA: NEGATIVE
Spec Grav, UA: 1.015 (ref 1.010–1.025)
Urobilinogen, UA: 4 E.U./dL — AB
pH, UA: 8.5 — AB (ref 5.0–8.0)

## 2022-10-15 LAB — RESP PANEL BY RT-PCR (FLU A&B, COVID) ARPGX2
Influenza A by PCR: NEGATIVE
Influenza B by PCR: NEGATIVE
SARS Coronavirus 2 by RT PCR: NEGATIVE

## 2022-10-15 LAB — POCT FASTING CBG KUC MANUAL ENTRY: POCT Glucose (KUC): 118 mg/dL — AB (ref 70–99)

## 2022-10-15 MED ORDER — ACETAMINOPHEN 325 MG PO TABS: 650.0000 mg | ORAL_TABLET | Freq: Four times a day (QID) | ORAL | 0 refills | Status: AC | PRN

## 2022-10-15 MED ORDER — PROMETHAZINE-DM 6.25-15 MG/5ML PO SYRP: 2.5000 mL | ORAL_SOLUTION | Freq: Three times a day (TID) | ORAL | 0 refills | Status: DC | PRN

## 2022-10-15 MED ORDER — CETIRIZINE HCL 10 MG PO TABS: 10.0000 mg | ORAL_TABLET | Freq: Every day | ORAL | 0 refills | Status: DC

## 2022-10-15 MED ORDER — BENZONATATE 100 MG PO CAPS: 100.0000 mg | ORAL_CAPSULE | Freq: Three times a day (TID) | ORAL | 0 refills | Status: DC | PRN

## 2022-10-15 MED ORDER — PSEUDOEPHEDRINE HCL 30 MG PO TABS: 30.0000 mg | ORAL_TABLET | Freq: Three times a day (TID) | ORAL | 0 refills | Status: DC | PRN

## 2022-10-15 MED ORDER — IBUPROFEN 600 MG PO TABS: 600.0000 mg | ORAL_TABLET | Freq: Four times a day (QID) | ORAL | 0 refills | Status: DC | PRN

## 2022-10-15 NOTE — ED Provider Notes (Signed)
[facility]  Note:  This document was prepared using Systems analyst and may include unintentional dictation errors.  MRN: [id number] DOB: 1986/08/18  Subjective:   Jennifer Holmes is a 36 y.o. female presenting for 1 day history of acute onset body aches, chills, dizziness, chest pain and shortness of breath while she was here in the clinic.  Has had intermittent headaches, started to have a cough.  Patient previously took blood pressure medication but was taken off of it as her blood pressure normalized.  She has not been taking any medicine for her blood pressure.  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  Since then she feels like she has had intermittent tingling and twitching of the right side of her face and forehead.  No history of stroke, heart disease, MI.  No active wheezing, numbness or tingling, weakness.  Patient is not a smoker, no drug use.  No diabetes.  No current facility-administered medications for this encounter.  Current Outpatient Medications:    permethrin (ELIMITE) 5 % cream, Apply cream from head to feet, leave on for 8 to 14 hours, then wash with soap/water, repeat application if living mites present 14 days after initial treatment, Disp: 60 g, Rfl: 1   Allergies  Allergen Reactions   Latex Itching   Lactose Intolerance (Gi) Other (See Comments)    Reaction:  GI upset    Past Medical History:  Diagnosis Date   Gastroesophageal Reflux Disease (GERD)    Headache    HELLP syndrome    History of blood transfusion for PP hemorrhage    Postoperative Nausea and Vomiting (PONV)    Preterm labor      Past Surgical History:  Procedure Laterality Date   CESAREAN SECTION N/A 03/01/2008    Family History  Problem Relation Age of Onset   Hypertension Mother    Heart disease Mother    Hypertension Brother     Social History   Tobacco Use   Smoking  status: Never   Smokeless tobacco: Never  Vaping Use   Vaping Use: Never used  Substance Use Topics   Alcohol use: No   Drug use: No    ROS   Objective:   Vitals: BP (!) 156/108 (BP Location: Left Arm)   Pulse (!) 101   Temp 98.6 F (37 C) (Oral)   Resp 18   LMP 09/14/2022   SpO2 97%   Breastfeeding Yes   BP Readings from Last 3 Encounters:  10/15/22 (!) 156/108  08/23/22 123/80  10/20/21 116/82   BP recheck was 143/98.   Physical Exam Constitutional:      General: She is not in acute distress.    Appearance: Normal appearance. She is well-developed. She is obese. She is ill-appearing. She is not toxic-appearing or diaphoretic.  HENT:     Head: Normocephalic and atraumatic.     Right Ear: Tympanic membrane, ear canal and external ear normal. No drainage or tenderness. No middle ear effusion. There is no impacted cerumen. Tympanic membrane is not erythematous or bulging.     Left Ear: Tympanic membrane, ear canal and external ear normal. No drainage or tenderness.  No middle ear effusion. There is no impacted cerumen. Tympanic membrane is not erythematous or bulging.     Nose: Nose normal. No congestion or rhinorrhea.     Mouth/Throat:     Mouth: Mucous membranes are moist. No oral  lesions.     Pharynx: Oropharynx is clear. No pharyngeal swelling, oropharyngeal exudate, posterior oropharyngeal erythema or uvula swelling.     Tonsils: No tonsillar exudate or tonsillar abscesses.  Eyes:     General: No scleral icterus.       Right eye: No discharge.        Left eye: No discharge.     Extraocular Movements: Extraocular movements intact.     Right eye: Normal extraocular motion.     Left eye: Normal extraocular motion.     Conjunctiva/sclera: Conjunctivae normal.     Pupils: Pupils are equal, round, and reactive to light.  Neck:     Meningeal: Brudzinski's sign and Kernig's sign absent.  Cardiovascular:     Rate and Rhythm: Normal rate and regular rhythm.     Heart  sounds: Normal heart sounds. No murmur heard.    No friction rub. No gallop.  Pulmonary:     Effort: Pulmonary effort is normal. No respiratory distress.     Breath sounds: No stridor. No wheezing, rhonchi or rales.  Chest:     Chest wall: No tenderness.  Abdominal:     General: Bowel sounds are normal. There is no distension.     Palpations: Abdomen is soft. There is no mass.     Tenderness: There is no abdominal tenderness. There is no right CVA tenderness, left CVA tenderness, guarding or rebound.  Musculoskeletal:     Cervical back: Normal range of motion and neck supple.  Lymphadenopathy:     Cervical: No cervical adenopathy.  Skin:    General: Skin is warm and dry.  Neurological:     General: No focal deficit present.     Mental Status: She is alert and oriented to person, place, and time.     Cranial Nerves: No cranial nerve deficit, dysarthria or facial asymmetry.     Motor: No weakness.     Coordination: Romberg sign negative. Coordination normal.     Gait: Gait normal.     Deep Tendon Reflexes: Reflexes normal.  Psychiatric:        Mood and Affect: Mood normal. Mood is not anxious or depressed.        Speech: Speech normal.        Behavior: Behavior normal. Behavior is not agitated.        Thought Content: Thought content normal.        Cognition and Memory: Cognition is not impaired. Memory is not impaired.        Judgment: Judgment normal.     Results for orders placed or performed during the hospital encounter of 10/15/22 (from the past 24 hour(s))  POCT CBG (manual entry)     Status: Abnormal   Collection Time: 10/15/22  3:42 PM  Result Value Ref Range   POCT Glucose ([facility]) 118 (A) 70 - 99 mg/dL  POCT urinalysis dipstick     Status: Abnormal   Collection Time: 10/15/22  3:48 PM  Result Value Ref Range   Color, UA yellow yellow   Clarity, UA clear clear   Glucose, UA negative negative mg/dL   Bilirubin, UA negative negative   Ketones, POC UA negative  negative mg/dL   Spec Grav, UA 1.015 1.010 - 1.025   Blood, UA trace-intact (A) negative   pH, UA 8.5 (A) 5.0 - 8.0   Protein Ur, POC trace (A) negative mg/dL   Urobilinogen, UA 4.0 (A) 0.2 or 1.0 E.U./dL   Nitrite, UA Negative Negative  Leukocytes, UA Negative Negative    ED ECG REPORT   Date: 10/15/2022  EKG Time: 5:25 PM  Rate: 99bpm  Rhythm: normal sinus rhythm,  normal EKG, normal sinus rhythm  Axis: Normal  Intervals: Prolonged QT  ST&T Change: T wave flattening in lead III  Narrative Interpretation: Sinus rhythm at 99 bpm with nonspecific T wave change and prolonged QT which is slightly different from previous.  Assessment and Plan :   PDMP not reviewed this encounter.  1. Acute viral syndrome   2. Shortness of breath   3. Body aches   4. Essential hypertension   5. Atypical chest pain      No acute findings.  Low suspicion for ACS, chest PE.  Discussed differential with patient and for now she prefers to be tested for COVID and flu as she has previously had atypical symptoms when she has had both infections.  Offered chest x-ray but she declined. Counseled patient on potential for adverse effects with medications prescribed/recommended today, maintain strict ER and return-to-clinic precautions discussed, patient verbalized understanding.    Jaynee Eagles, PA-C 10/15/22 1930

## 2022-10-15 NOTE — ED Triage Notes (Addendum)
Patient presents to Urgent Care with complaints of high blood pressure since this morning. States she developed chest pain and SOB at 1430 while in lobby. Reports pressure at center of chest. Patient states she quit taking amlodipine since she had BP under control stopped by her PCP. Reports HA, nausea, right sided facial numbness, fatigue, dizziness since 3 weeks ago. Pt states she checked her BP today systolic in the 599 range. Took tylenol to treat pain to see if the pain was the cause of elevated BP, states this made her symptoms worse. She had a negative preg test.   Denies numbness to extremities.

## 2023-01-17 IMAGING — US US MFM OB FOLLOW-UP
1 series · 13 of 28 positions shown · non-contrast
Comparison: none

[Series 1: us mfm ob follow-up · 13 of 46 slices shown]
[im 2/46]
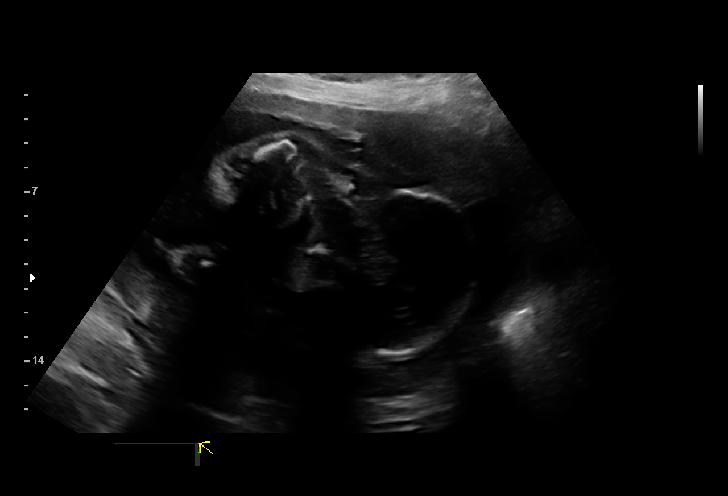
[im 6/46]
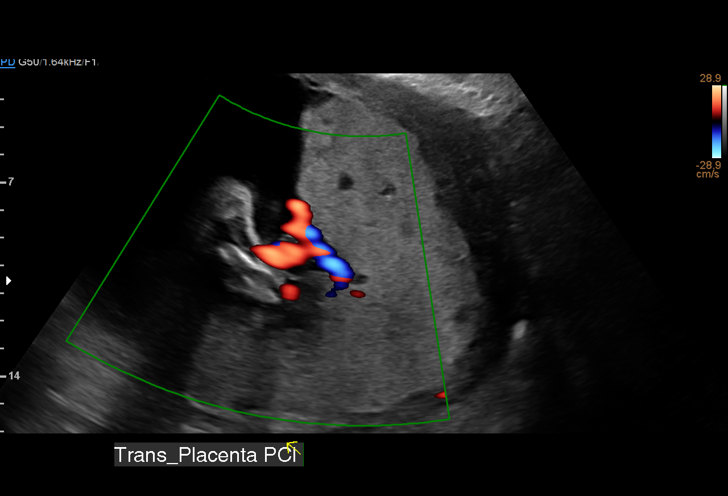
[im 9/46]
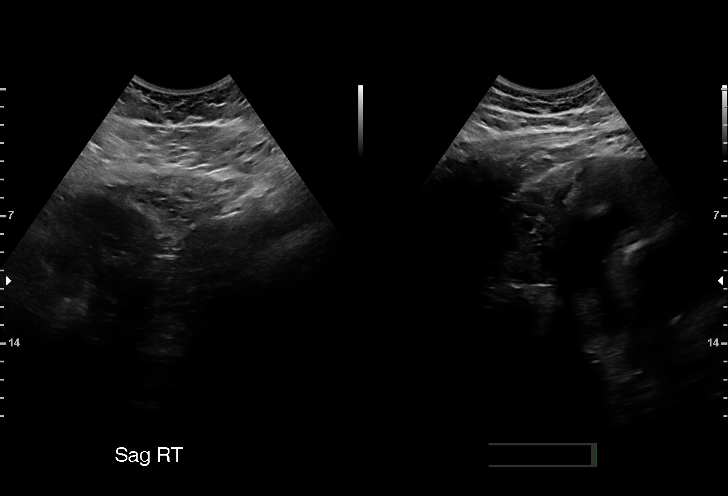
[im 12/46]
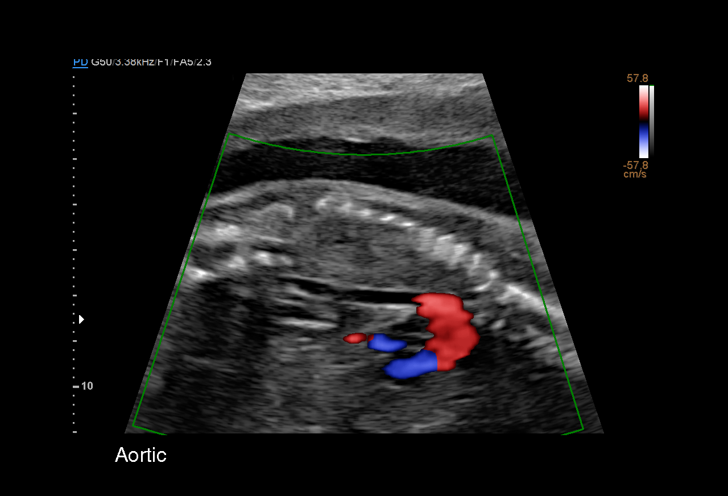
[im 16/46]
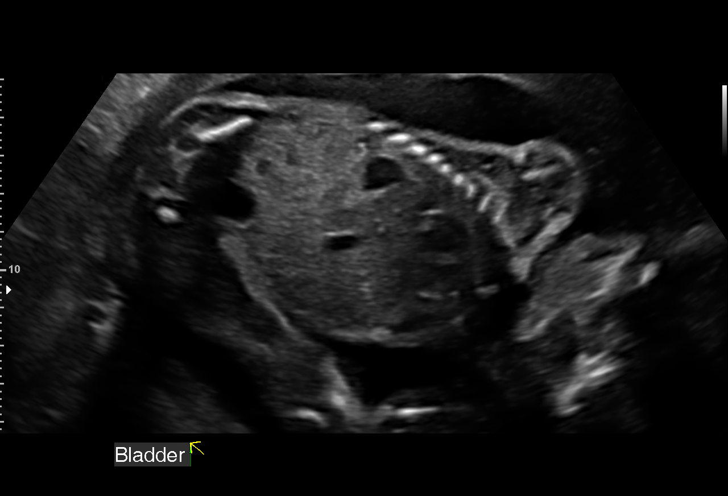
[im 19/46]
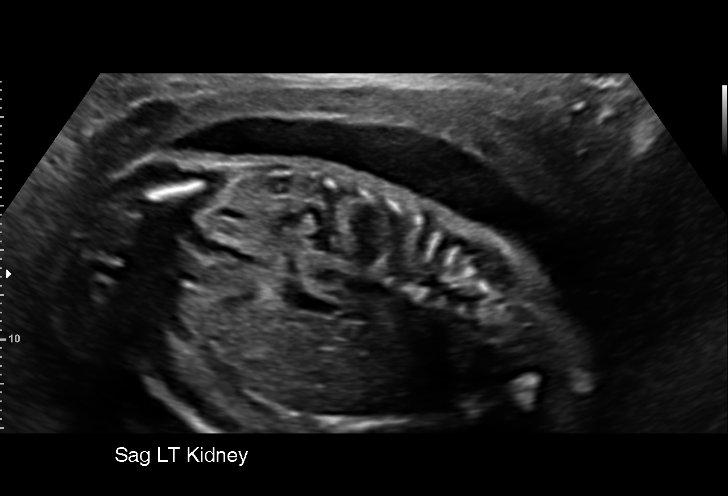
[im 24/46]
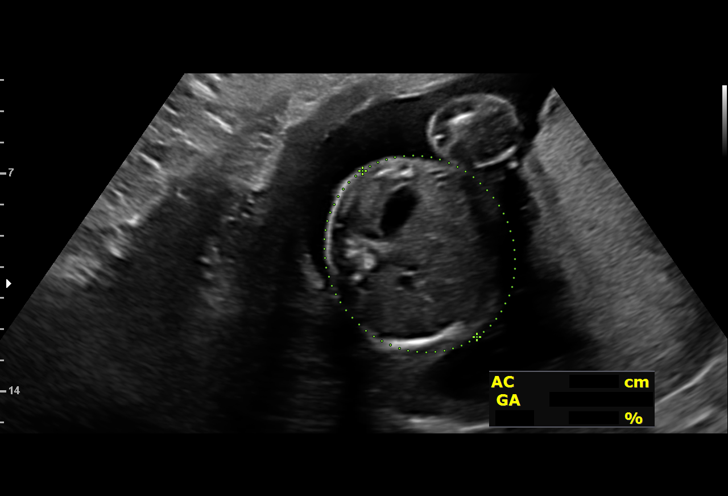
[im 27/46]
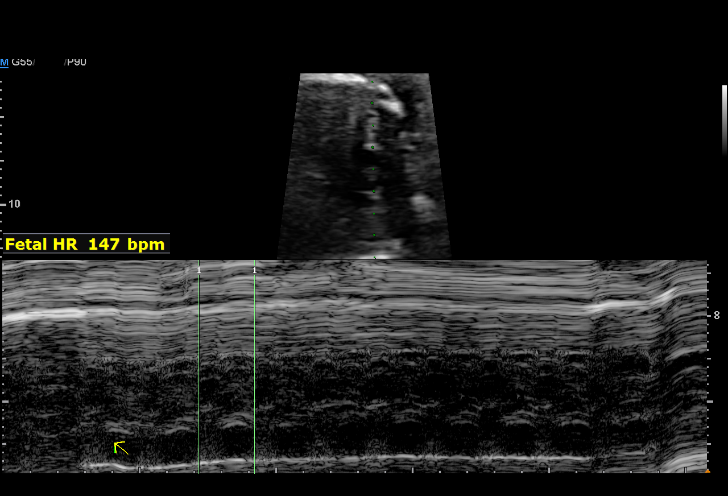
[im 31/46]
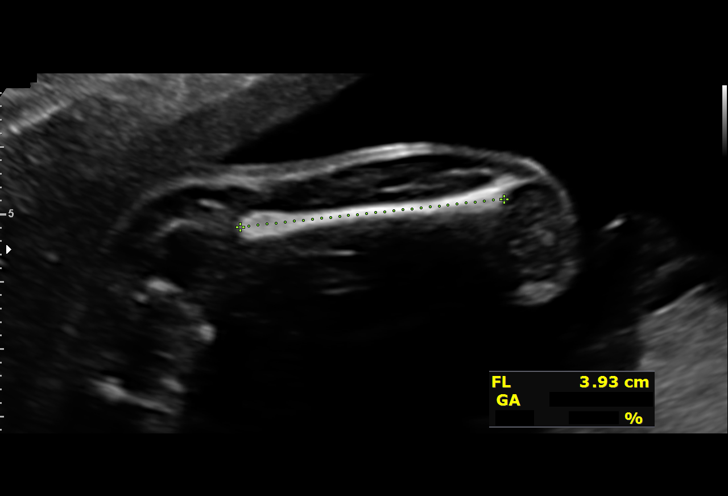
[im 34/46]
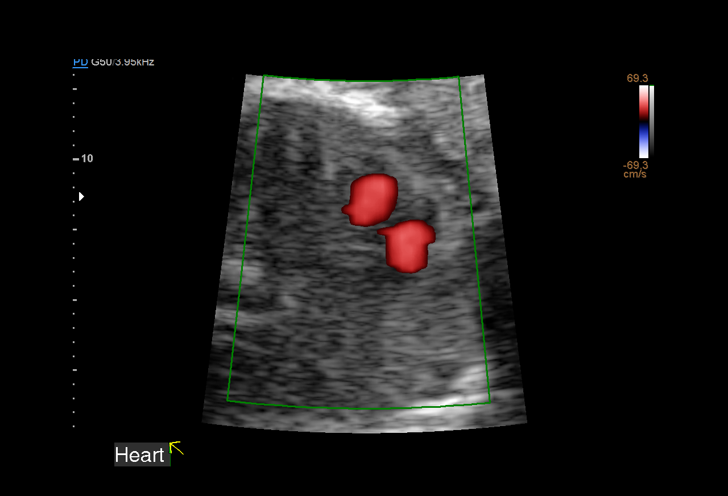
[im 37/46]
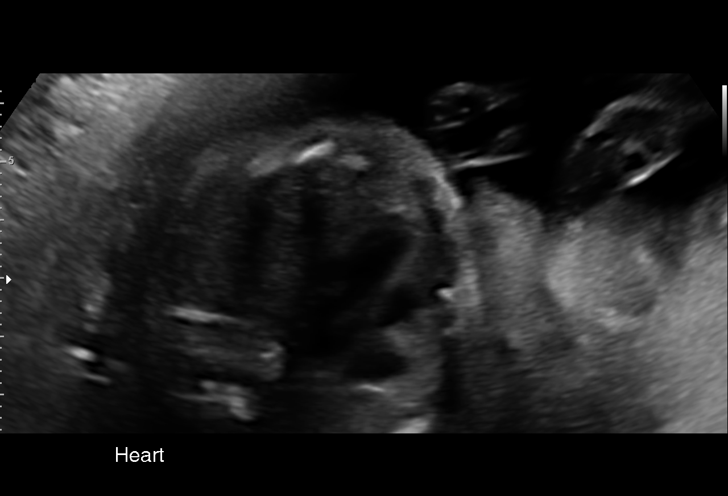
[im 41/46]
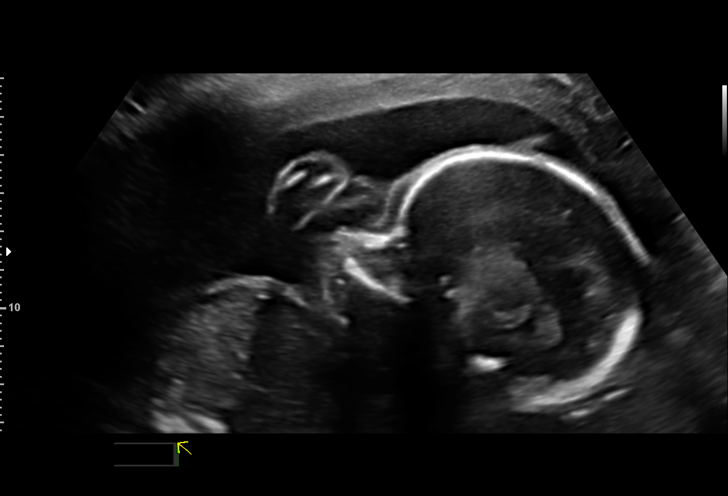
[im 44/46]
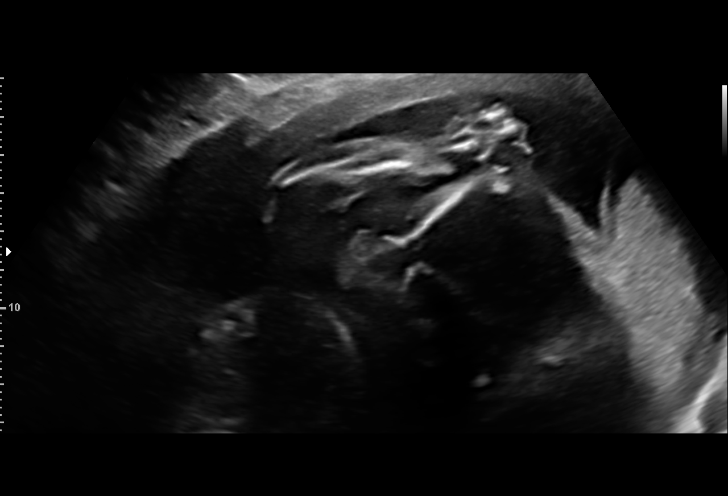

[13 of 28 positions shown; findings below may reference images not displayed]

Indications

 Hypertension - Chronic/Pre-existing
 (Labetalol)
 History of cesarean delivery, currently
 pregnant (successful VBAC)
 Medical complication of pregnancy
 (Abnormal NII AKWEI QUACOO)
 Poor obstetric history: Previous preterm
 delivery, antepartum (91weeks, 35 weeks,
 HELLP)
 23 weeks gestation of pregnancy
 Encounter for other antenatal screening
 follow-up
 Obesity complicating pregnancy, second
 trimester (BMI 35)
Fetal Evaluation

 Num Of Fetuses:         1
 Fetal Heart Rate(bpm):  147
 Cardiac Activity:       Observed
 Presentation:           Cephalic
 Placenta:               Posterior
 P. Cord Insertion:      Visualized, central

 Amniotic Fluid
 AFI FV:      Within normal limits

                             Largest Pocket(cm)

Biometry

 BPD:      58.1  mm     G. Age:  23w 6d         75  %    CI:        76.34   %    70 - 86
                                                         FL/HC:      18.4   %    19.2 -
 HC:      210.7  mm     G. Age:  23w 1d         41  %    HC/AC:      1.08        1.05 -
 AC:       196   mm     G. Age:  24w 2d         80  %    FL/BPD:     66.8   %    71 - 87
 FL:       38.8  mm     G. Age:  22w 3d         22  %    FL/AC:      19.8   %    20 - 24

 Est. FW:     598  gm      1 lb 5 oz     66  %
OB History

 Gravidity:    4
 Living:       3
Gestational Age

 LMP:           23w 0d        Date:  10/26/20                 EDD:   08/02/21
 U/S Today:     23w 3d                                        EDD:   07/30/21
 Best:          23w 0d     Det. By:  LMP  (10/26/20)          EDD:   08/02/21
Anatomy

 Cranium:               Previously seen        LVOT:                   Previously seen
 Cavum:                 Previously seen        Aortic Arch:            Appears normal
 Ventricles:            Previously seen        Ductal Arch:            Previously seen
 Choroid Plexus:        Previously seen        Diaphragm:              Appears normal
 Cerebellum:            Previously seen        Stomach:                Appears normal, left
                                                                       sided
 Posterior Fossa:       Previously seen        Abdomen:                Appears normal
 Nuchal Fold:           Previously seen        Abdominal Wall:         Previously seen
 Face:                  Orbits and profile     Cord Vessels:           Previously seen
                        previously seen
 Lips:                  Appears normal         Kidneys:                Appear normal
 Palate:                Not well visualized    Bladder:                Appears normal
 Thoracic:              Previously seen        Spine:                  Ltd views no
                                                                       intracranial signs of
                                                                       NTD
 Heart:                 Not well visualized    Upper Extremities:      Previously seen
 RVOT:                  Previously seen        Lower Extremities:      Previously seen

 Other:  Lenses prev visualized. Fetus appears to be a male. Technically
         difficult due to maternal habitus.
Cervix Uterus Adnexa

 Cervix
 Length:           4.24  cm.
 Normal appearance by transabdominal scan.

 Uterus
 No abnormality visualized.

 Right Ovary
 Not visualized.
 Left Ovary
 Not visualized.

 Cul De Sac
 No free fluid seen.

 Adnexa
 No adnexal mass visualized.
Impression

 Chronic hypertension. Well-controlled on labetalol .

 BP at our office: 130/87 mm Hg .

 Patient returned for completion of fetal anatomy .Fetal
 biometry is consistent with her previously-established dates
 .Amniotic fluid is normal and good fetal activity is seen .Fetal
 anatomical survey was completed (except 4-chamber view)
 and appears normal.
Recommendations

 -An appointment was made for her to return in 4 weeks for
 fetal growth assessment.
                 Joshjax, [place]

## 2023-03-15 IMAGING — US US MFM OB FOLLOW-UP
1 series · 14 of 28 positions shown · non-contrast
Comparison: none

[Series 1: us mfm ob follow-up · 14 of 36 slices shown]
[im 2/36]
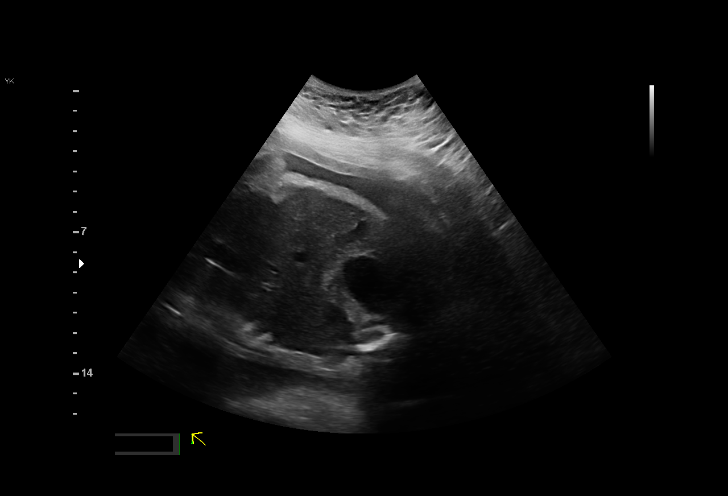
[im 4/36]
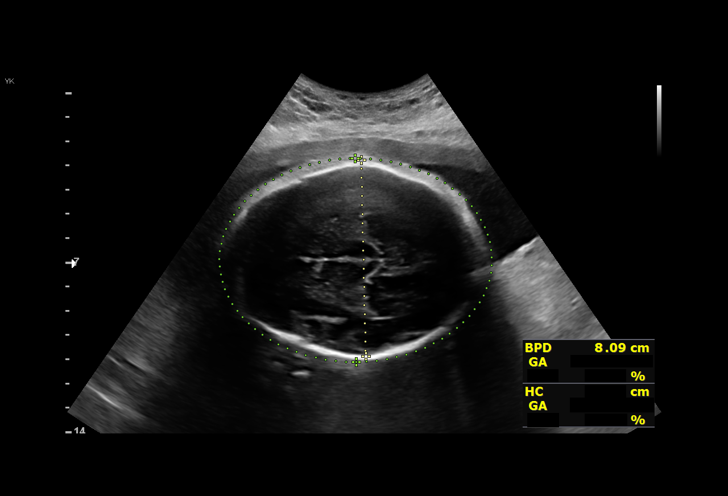
[im 7/36]
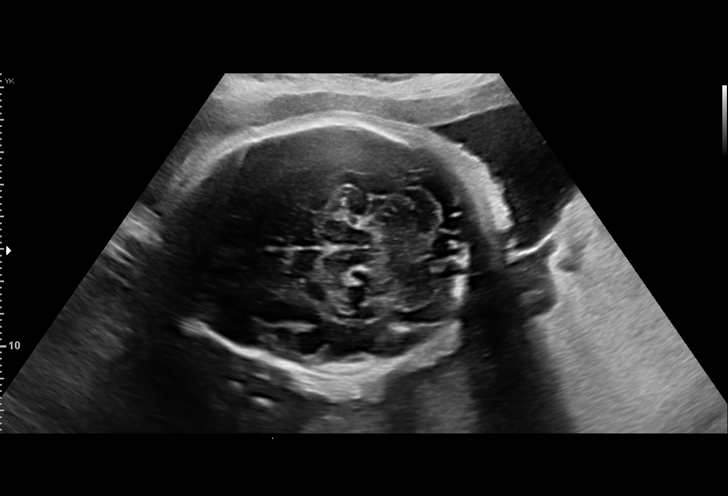
[im 10/36]
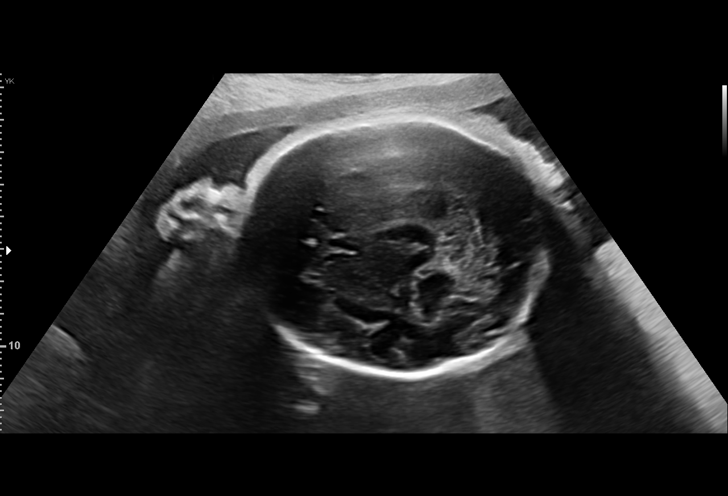
[im 12/36]
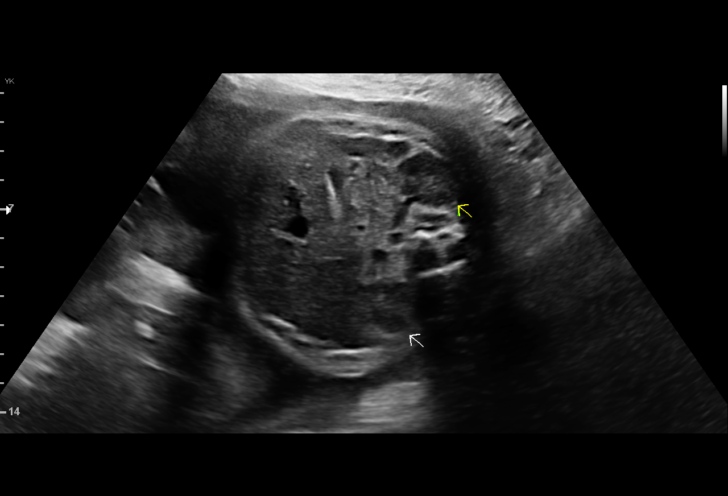
[im 15/36]
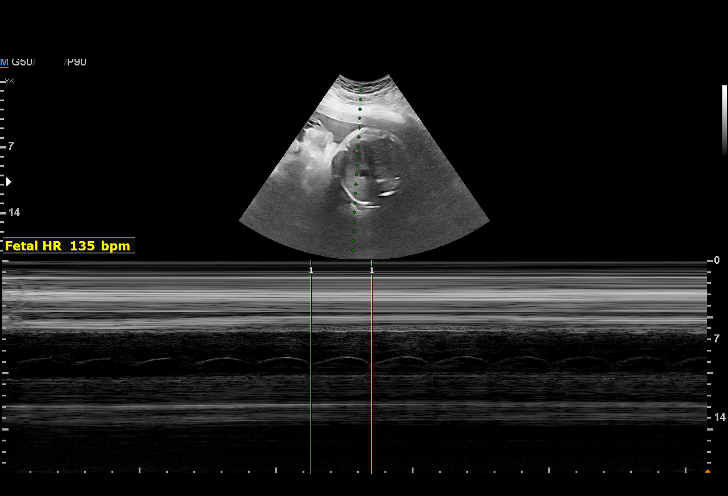
[im 17/36]
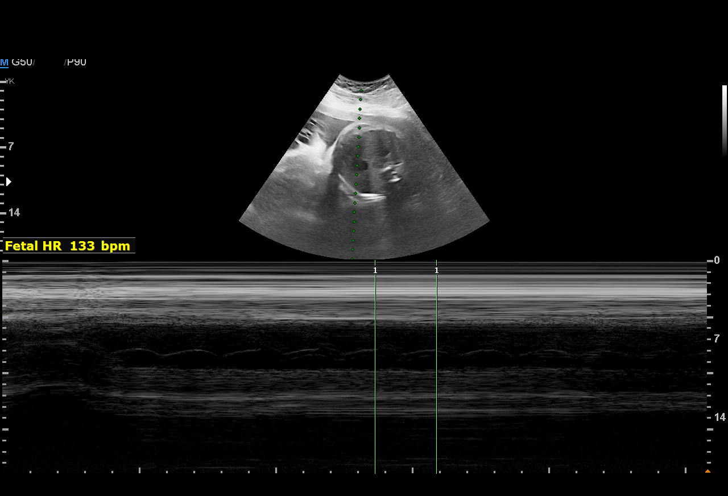
[im 20/36]
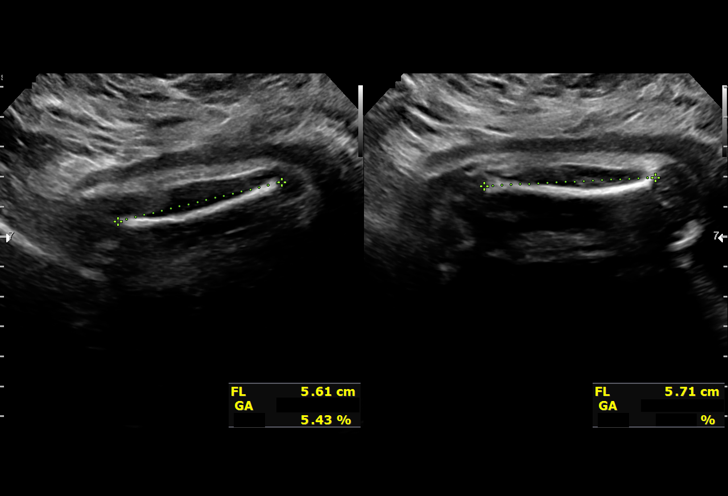
[im 23/36]
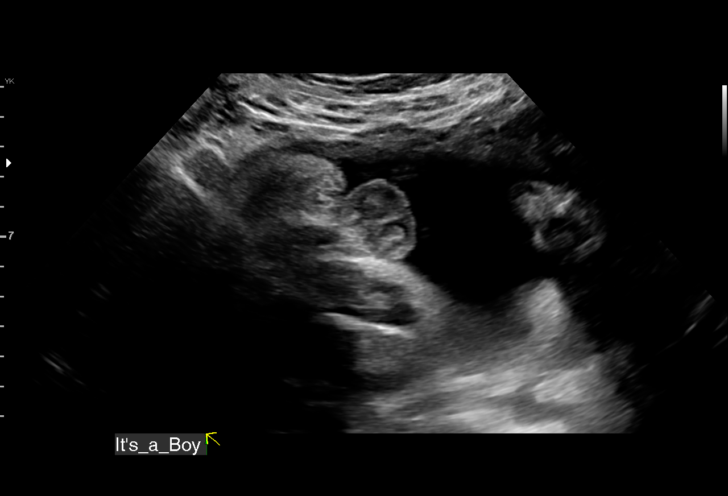
[im 25/36]
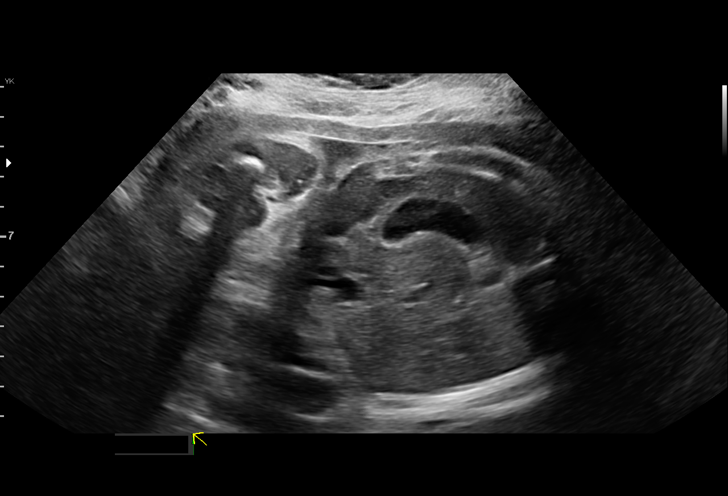
[im 28/36]
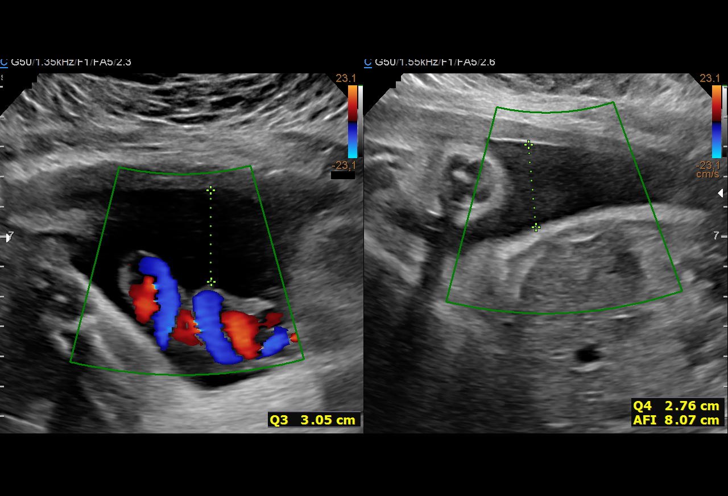
[im 30/36]
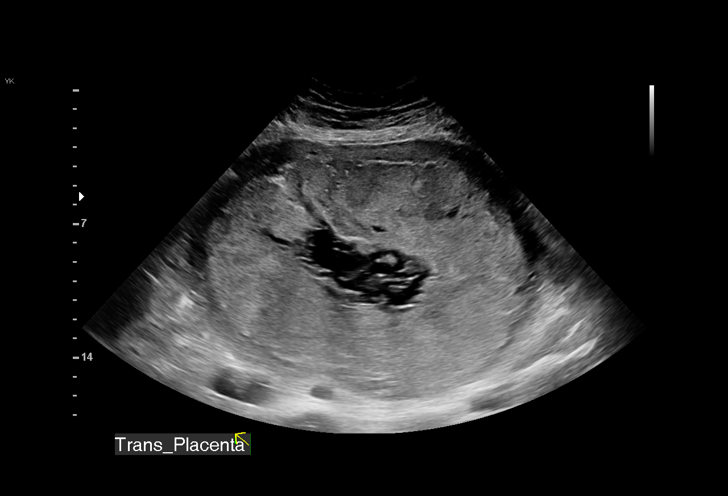
[im 33/36]
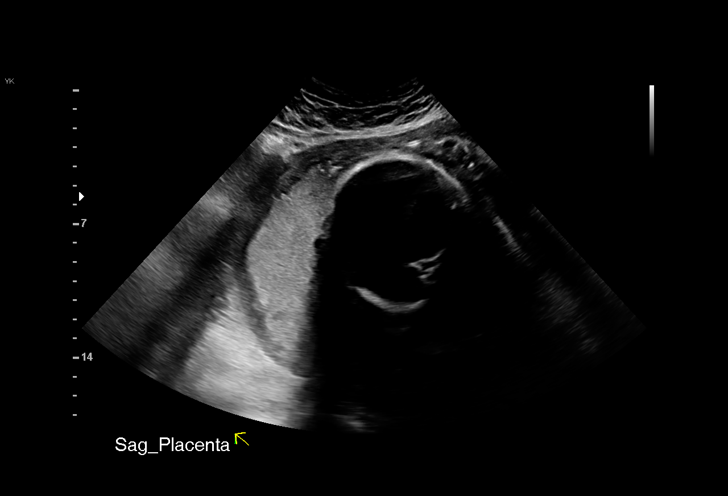
[im 36/36]
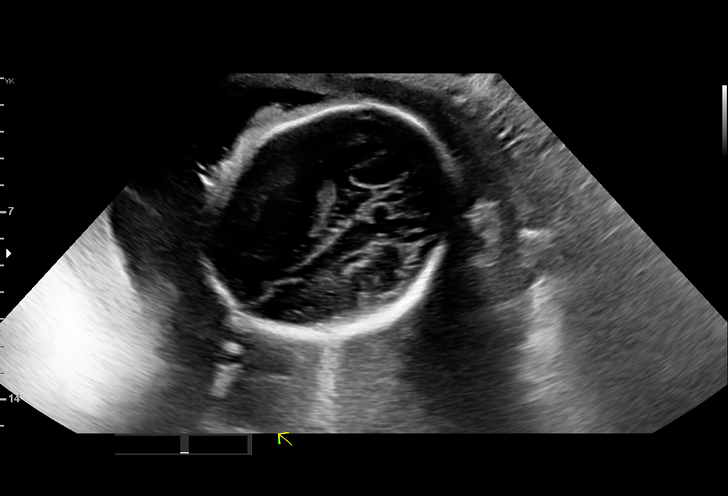

[14 of 28 positions shown; findings below may reference images not displayed]

Indications

 Hypertension - Chronic/Pre-existing
 (Labetalol)
 Poor obstetric history: Previous preterm
 delivery, antepartum (93weeks, 35 weeks,
 HELLP)
 History of cesarean delivery, currently
 pregnant (successful VBAC)
 Medical complication of pregnancy
 (Abnormal SHARITA ZELAYA)
 Obesity complicating pregnancy, second
 trimester (BMI 35)
 31 weeks gestation of pregnancy
Fetal Evaluation

 Num Of Fetuses:         1
 Fetal Heart Rate(bpm):  135
 Cardiac Activity:       Observed
 Presentation:           Breech
 Placenta:               Posterior Fundal
 P. Cord Insertion:      Previously Visualized

 Amniotic Fluid
 AFI FV:      Within normal limits

 AFI Sum(cm)     %Tile       Largest Pocket(cm)
 13.39           42

 RUQ(cm)       RLQ(cm)       LUQ(cm)        LLQ(cm)

Biometry

 BPD:      80.5  mm     G. Age:  32w 2d         75  %    CI:        68.76   %    70 - 86
                                                         FL/HC:      18.7   %    19.3 -
 HC:      310.2  mm     G. Age:  34w 5d         95  %    HC/AC:      1.04        0.96 -
 AC:      297.3  mm     G. Age:  33w 5d         97  %    FL/BPD:     71.9   %    71 - 87
 FL:       57.9  mm     G. Age:  30w 2d         16  %    FL/AC:      19.5   %    20 - 24

 Est. FW:    7074  gm      4 lb 8 oz     87  %
OB History

 Gravidity:    4
 Living:       3
Gestational Age

 LMP:           31w 1d        Date:  10/26/20                 EDD:   08/02/21
 U/S Today:     32w 5d                                        EDD:   07/22/21
 Best:          31w 1d     Det. By:  LMP  (10/26/20)          EDD:   08/02/21
Anatomy

 Cranium:               Appears normal         Aortic Arch:            Previously seen
 Cavum:                 Appears normal         Ductal Arch:            Previously seen
 Ventricles:            Appears normal         Diaphragm:              Previously seen
 Choroid Plexus:        Previously seen        Stomach:                Appears normal, left
                                                                       sided
 Cerebellum:            Appears normal         Abdomen:                Appears normal
 Posterior Fossa:       Appears normal         Abdominal Wall:         Previously seen
 Nuchal Fold:           Previously seen        Cord Vessels:           Previously seen
 Face:                  Orbits and profile     Kidneys:                Appear normal
                        previously seen
 Lips:                  Previously seen        Bladder:                Appears normal
 Thoracic:              Previously seen        Spine:                  Not well visualized
 Heart:                 Previously seen        Upper Extremities:      Previously seen
 RVOT:                  Previously seen        Lower Extremities:      Previously seen
 LVOT:                  Previously seen

 Other:  Fetus appears to be a male. Technicallly difficult due to advanced GA
         and maternal habitus.
Impression

 Follow up growth due to chronic hypertension on Labetalol
 Normal interval growth with measurements consistent with
 dates
 Good fetal movement and amniotic fluid volume.

 Ms. Ruslan is overall doing well without complaints. She reports
 irregular contractions, but not painful and not 5 min apart  for
 an hour. I counseled her given her history of PTD to not
 hesitate to contact her providers regarding persistent
 contractions.

 Also today the fetus is breech.
Recommendations

 Continue weekly testing as scheduled
 Repeat growth in 4 weeks.

## 2023-04-04 IMAGING — US US MFM FETAL BPP W/ NON-STRESS
1 series · 9 of 9 positions shown · non-contrast
Comparison: none

[Series 1: us mfm fetal bpp w/ non-stress · 9 acquisitions, 9 frames shown]
[im 1/9]
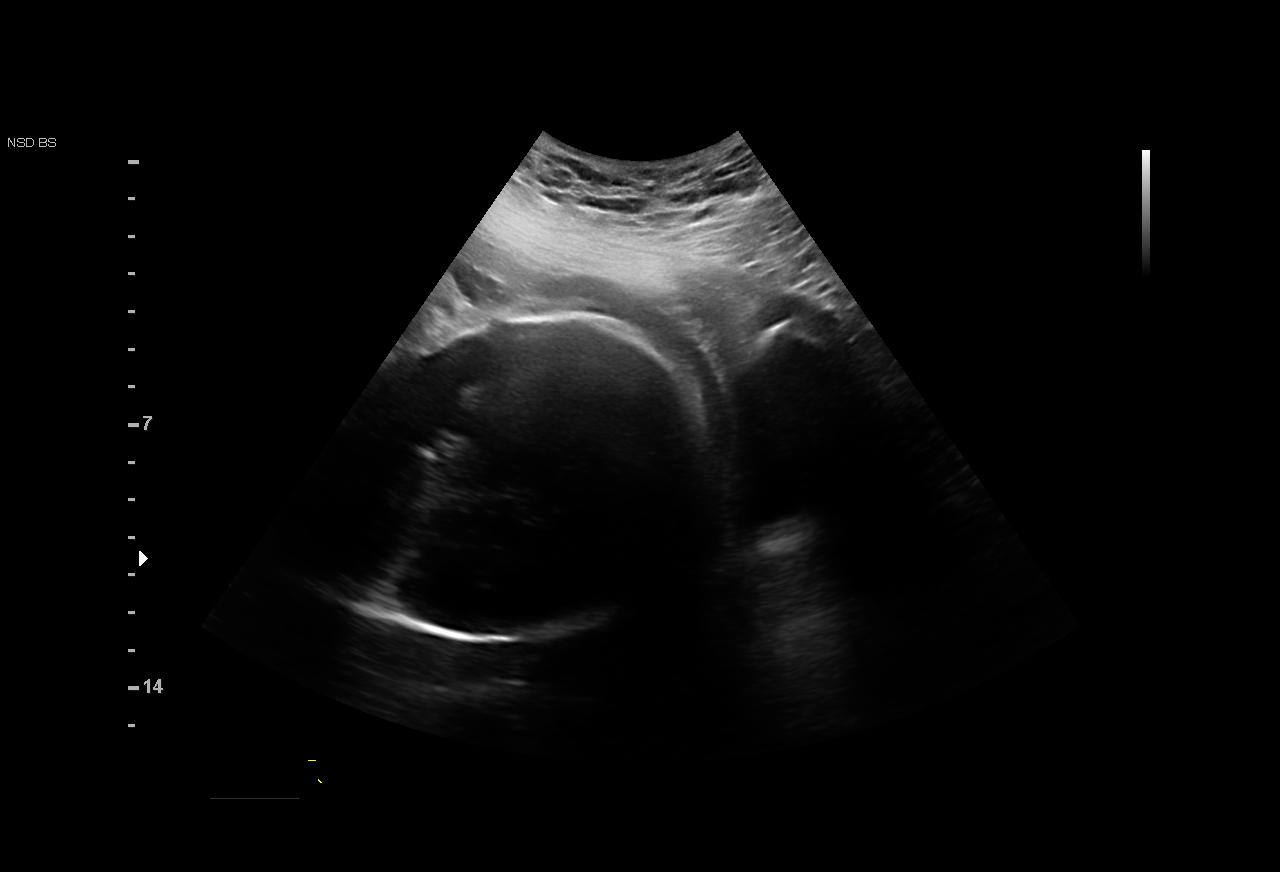
[im 2/9]
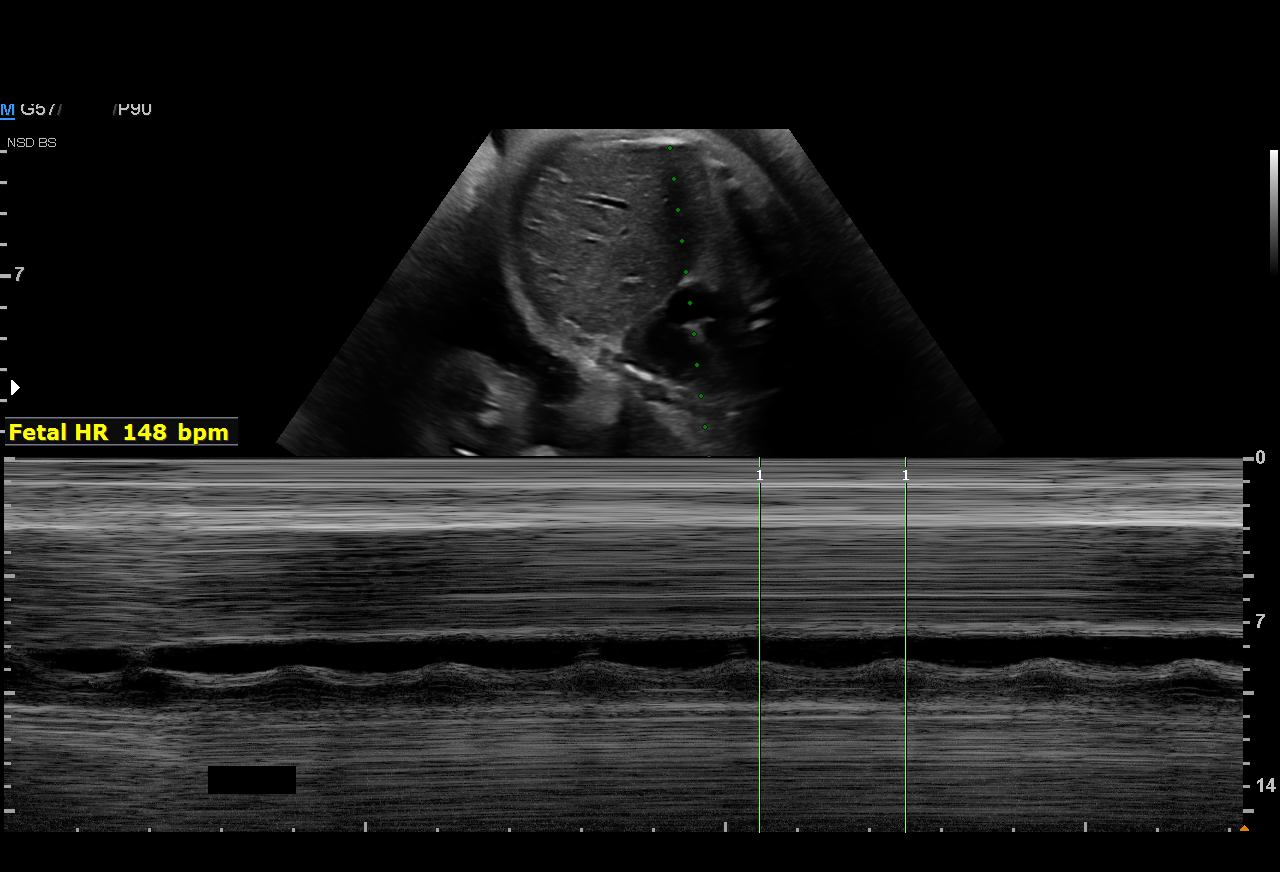
[im 3/9]
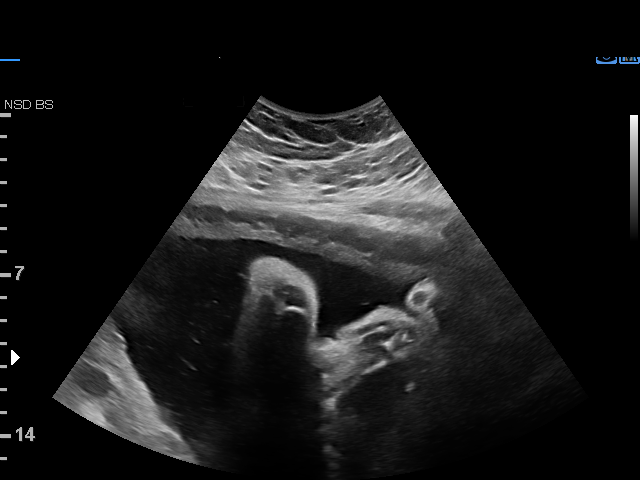
[im 4/9]
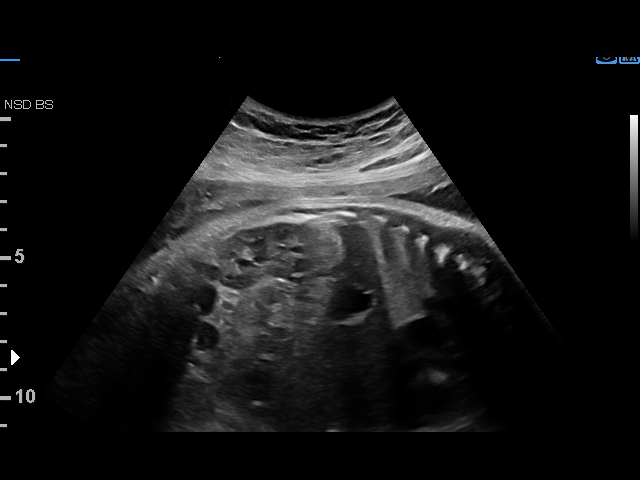
[im 5/9]
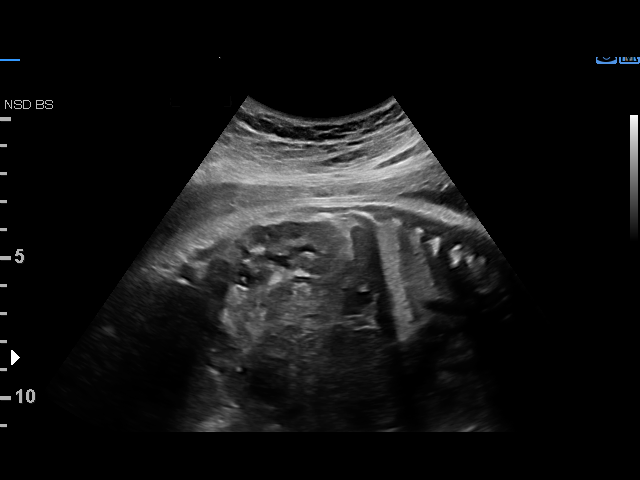
[im 6/9]
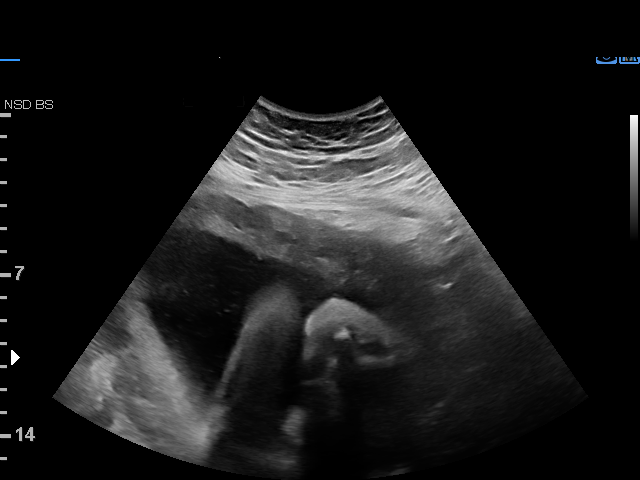
[im 7/9]
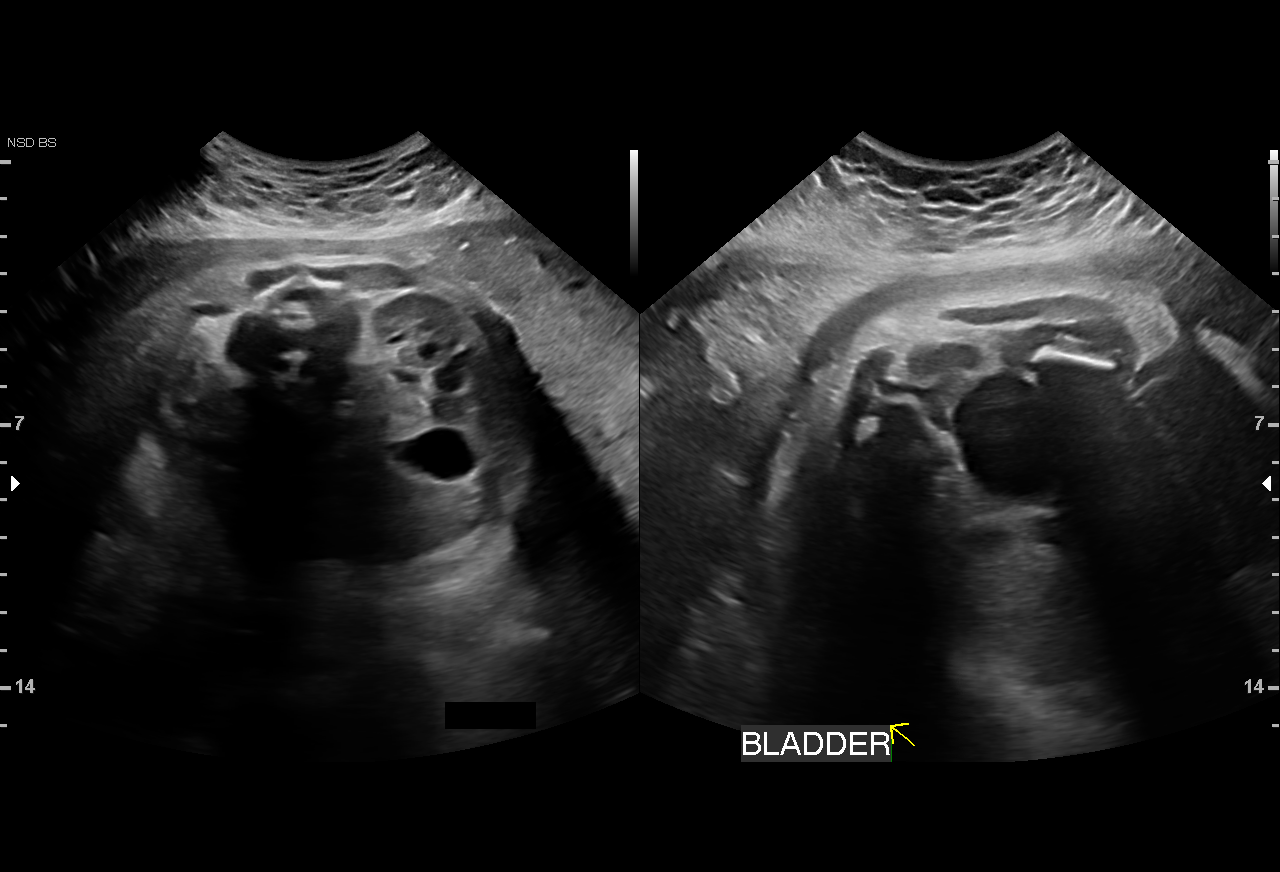
[im 8/9]
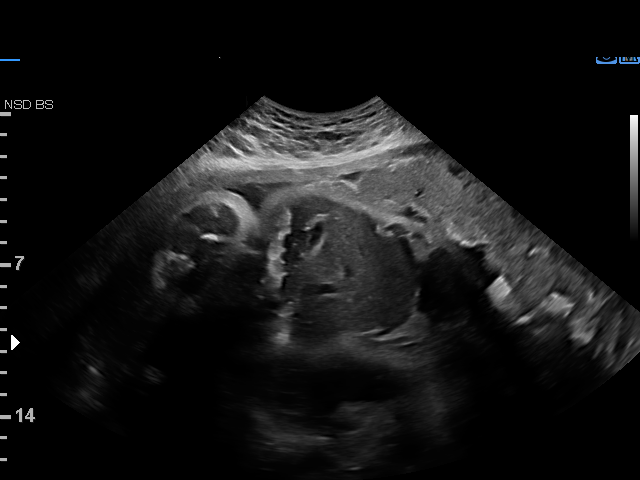
[im 9/9]
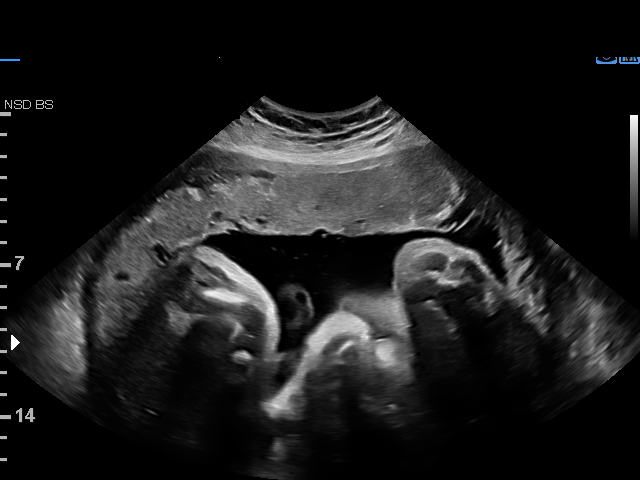

[9 of 9 positions shown; findings below may reference images not displayed]

W/NONSTRESS                                       BABLO

Indications

 Poor obstetric history: Previous preterm
 delivery, antepartum (72weeks, 35 weeks,
 HELLP)
 34 weeks gestation of pregnancy
 History of cesarean delivery, currently
 pregnant (successful VBAC)
 Medical complication of pregnancy
 (Abnormal LIFRANIAH CALESSI)
 Hypertension - Chronic/Pre-existing
 (Labetalol)
Fetal Evaluation

 Num Of Fetuses:         1
 Fetal Heart Rate(bpm):  148
 Cardiac Activity:       Observed
 Presentation:           Cephalic

 Amniotic Fluid
 AFI FV:      Within normal limits

 AFI Sum(cm)     %Tile       Largest Pocket(cm)
 13.27           43

 RUQ(cm)       RLQ(cm)       LUQ(cm)        LLQ(cm)

Biophysical Evaluation

 Amniotic F.V:   Within normal limits       F. Tone:        Observed
 F. Movement:    Observed                   N.S.T:          Reactive
 F. Breathing:   Observed                   Score:          [DATE]
OB History

 Gravidity:    4
 Living:       3
Gestational Age

 LMP:           34w 0d        Date:  10/26/20                 EDD:   08/02/21
 Best:          34w 0d     Det. By:  LMP  (10/26/20)          EDD:   08/02/21
Impression

 Chronic hypertension.  Well controlled on labetalol.
 Blood pressure today at her office is

 Amniotic fluid is normal and good fetal activity is seen
 .Antenatal testing is reassuring. NST is reactive. BPP [DATE].
Recommendations

 -Fetal growth, BPP and NST next week.
 -Patient is having weekly BPP at her provider's office.
                 Agwan, Satyende

## 2024-05-26 ENCOUNTER — Ambulatory Visit
Admission: EM | Admit: 2024-05-26 | Discharge: 2024-05-26 | Disposition: A | Discharge status: Home / Self Care | Attending: Family Medicine | Admitting: Family Medicine

## 2024-05-26 DIAGNOSIS — R030 Elevated blood-pressure reading, without diagnosis of hypertension: Secondary | ICD-10-CM

## 2024-05-26 DIAGNOSIS — H1013 Acute atopic conjunctivitis, bilateral: Secondary | ICD-10-CM

## 2024-05-26 DIAGNOSIS — I10 Essential (primary) hypertension: Secondary | ICD-10-CM | POA: Diagnosis not present

## 2024-05-26 DIAGNOSIS — J018 Other acute sinusitis: Secondary | ICD-10-CM

## 2024-05-26 MED ORDER — AMOXICILLIN 875 MG PO TABS: 875.0000 mg | ORAL_TABLET | Freq: Two times a day (BID) | ORAL | 0 refills | Status: DC

## 2024-05-26 MED ORDER — CETIRIZINE HCL 10 MG PO TABS: 10.0000 mg | ORAL_TABLET | Freq: Every day | ORAL | 0 refills | Status: DC

## 2024-05-26 MED ORDER — LISINOPRIL-HYDROCHLOROTHIAZIDE 10-12.5 MG PO TABS: 1.0000 | ORAL_TABLET | Freq: Every day | ORAL | 0 refills | Status: DC

## 2024-05-26 MED ORDER — AZELASTINE HCL 0.05 % OP SOLN: 1.0000 [drp] | Freq: Two times a day (BID) | OPHTHALMIC | 0 refills | Status: DC

## 2024-05-26 MED ORDER — ERYTHROMYCIN 5 MG/GM OP OINT: TOPICAL_OINTMENT | Freq: Four times a day (QID) | OPHTHALMIC | 0 refills | Status: DC

## 2024-05-26 NOTE — ED Triage Notes (Signed)
 Bilateral eye pain and swelling x 2 days. Patient states she was cleaning over the weekend and grease pop into her eyes.

## 2024-05-26 NOTE — ED Provider Notes (Signed)
 [facility]  Note:  This document was prepared using Conservation officer, historic buildings and may include unintentional dictation errors.  MRN: [id number] DOB: June 27, 1986  Subjective:   Jennifer Holmes is a 38 y.o. female presenting for multiple chief complaints.  Reports 2-day history of bilateral eye redness, irritation and swelling.  Symptoms started after she was cleaning a kitchen area.  As she turned on the vent, reports that she feels that some of the grease that she was cleaning may have gotten into her eyes.  Has had some eyelid swelling as well.  No vision changes, drainage, photophobia, eye trauma, contact lens use. Reports 2-week history of persistent sinus congestion, sinus drainage, sinus headaches, coughing, malaise and fatigue.  Reports that she had exposure to COVID-19 prior to her symptoms developing.  No active chest pain, shortness of breath, wheezing.  No history of asthma.  No smoking of any kind including cigarettes, cigars, vaping, marijuana use.   Reports longstanding history of hypertension.  Used to take metoprolol but discontinued this on her own.  She has been monitoring her pressures and admits that it has been elevated.  Does not have a PCP.  Does not practice a hypertensive friendly diet.  No history of MI, heart disease, kidney disease, stroke.  No weakness, numbness or tingling, hematuria.  No current facility-administered medications for this encounter.  Current Outpatient Medications:    cetirizine  (ZYRTEC  ALLERGY) 10 MG tablet, Take 1 tablet (10 mg total) by mouth daily., Disp: 30 tablet, Rfl: 0   acetaminophen  (TYLENOL ) 325 MG tablet, Take 2 tablets (650 mg total) by mouth every 6 (six) hours as needed for moderate pain., Disp: 30 tablet, Rfl: 0   benzonatate  (TESSALON ) 100 MG capsule, Take 1 capsule (100 mg total) by mouth 3 (three) times daily as needed for cough., Disp: 30 capsule, Rfl: 0   ibuprofen  (ADVIL ) 600 MG tablet,  Take 1 tablet (600 mg total) by mouth every 6 (six) hours as needed., Disp: 30 tablet, Rfl: 0   promethazine -dextromethorphan (PROMETHAZINE -DM) 6.25-15 MG/5ML syrup, Take 2.5 mLs by mouth 3 (three) times daily as needed for cough., Disp: 100 mL, Rfl: 0   pseudoephedrine  (SUDAFED) 30 MG tablet, Take 1 tablet (30 mg total) by mouth every 8 (eight) hours as needed for congestion., Disp: 30 tablet, Rfl: 0   Allergies  Allergen Reactions   Latex Itching   Lactose Intolerance (Gi) Other (See Comments)    Reaction:  GI upset    Past Medical History:  Diagnosis Date   Gastroesophageal Reflux Disease (GERD)    Headache    HELLP syndrome    History of blood transfusion for PP hemorrhage    Postoperative Nausea and Vomiting (PONV)    Preterm labor      Past Surgical History:  Procedure Laterality Date   CESAREAN SECTION N/A 03/01/2008    Family History  Problem Relation Age of Onset   Hypertension Mother    Heart disease Mother    Hypertension Brother     Social History   Tobacco Use   Smoking status: Never   Smokeless tobacco: Never  Vaping Use   Vaping status: Never Used  Substance Use Topics   Alcohol use: No   Drug use: No    ROS   Objective:   Vitals: BP (!) 163/121 (BP Location: Left Arm)   Pulse 86   Temp 98.3 F (36.8 C) (Oral)   Resp 20   LMP 04/28/2024 (Approximate)  SpO2 98%   BP Readings from Last 3 Encounters:  05/26/24 (!) 163/121  10/15/22 (!) 156/108  08/23/22 123/80    Physical Exam Constitutional:      General: She is not in acute distress.    Appearance: Normal appearance. She is well-developed and normal weight. She is not ill-appearing, toxic-appearing or diaphoretic.  HENT:     Head: Normocephalic and atraumatic.     Right Ear: Tympanic membrane, ear canal and external ear normal. No drainage or tenderness. No middle ear effusion. There is no impacted cerumen. Tympanic membrane is not erythematous or bulging.     Left Ear: Tympanic  membrane, ear canal and external ear normal. No drainage or tenderness.  No middle ear effusion. There is no impacted cerumen. Tympanic membrane is not erythematous or bulging.     Nose: Nose normal. No congestion or rhinorrhea.     Mouth/Throat:     Mouth: Mucous membranes are moist. No oral lesions.     Pharynx: No pharyngeal swelling, oropharyngeal exudate, posterior oropharyngeal erythema or uvula swelling.     Tonsils: No tonsillar exudate or tonsillar abscesses.   Eyes:     General: Lids are everted, no foreign bodies appreciated. Vision grossly intact. No scleral icterus.       Right eye: No foreign body, discharge or hordeolum.        Left eye: No foreign body, discharge or hordeolum.     Extraocular Movements: Extraocular movements intact.     Right eye: Normal extraocular motion.     Left eye: Normal extraocular motion and no nystagmus.     Conjunctiva/sclera:     Right eye: Right conjunctiva is injected. No chemosis, exudate or hemorrhage.    Left eye: Left conjunctiva is injected. No chemosis, exudate or hemorrhage.    Comments: Trace swelling of the upper and lower eyelids bilaterally.   Cardiovascular:     Rate and Rhythm: Normal rate and regular rhythm.     Heart sounds: Normal heart sounds. No murmur heard.    No friction rub. No gallop.  Pulmonary:     Effort: Pulmonary effort is normal. No respiratory distress.     Breath sounds: No stridor. No wheezing, rhonchi or rales.  Chest:     Chest wall: No tenderness.   Musculoskeletal:     Cervical back: Normal range of motion and neck supple.  Lymphadenopathy:     Cervical: No cervical adenopathy.   Skin:    General: Skin is warm and dry.   Neurological:     General: No focal deficit present.     Mental Status: She is alert and oriented to person, place, and time.     Cranial Nerves: No cranial nerve deficit.     Motor: No weakness.     Coordination: Coordination normal.     Gait: Gait normal.     Deep Tendon  Reflexes: Reflexes normal.     Comments: Negative Romberg and pronator drift.  No facial asymmetry.  Psychiatric:        Mood and Affect: Mood normal.        Behavior: Behavior normal.        Thought Content: Thought content normal.        Judgment: Judgment normal.    Eye Exam: Eyelids everted and swept for foreign body. The eye was stained with fluorescein. Examination under woods lamp does not reveal a foreign body or area of increased stain uptake. The eye was then irrigated copiously with  saline.   Assessment and Plan :   PDMP not reviewed this encounter.  1. Allergic conjunctivitis of both eyes   2. Acute non-recurrent sinusitis of other sinus   3. Essential hypertension   4. Elevated blood pressure reading    Recommend erythromycin ointment and azelastine eyedrops for allergic conjunctivitis of both eyes secondary to chemical exposure from the cleaning agents she was using.  Start amoxicillin  for sinusitis, use supportive care.  Emphasized need for hypertensive friendly diet, start lisinopril hydrochlorothiazide.  No signs of hypertensive emergency.  Establish care with a new PCP as soon as possible.  Deferred imaging given clear cardiopulmonary exam, hemodynamically stable vital signs.  Counseled patient on potential for adverse effects with medications prescribed/recommended today, ER and return-to-clinic precautions discussed, patient verbalized understanding.    Adolph Hoop, [place] 05/26/24 [phone]

## 2024-11-20 ENCOUNTER — Emergency Department (HOSPITAL_BASED_OUTPATIENT_CLINIC_OR_DEPARTMENT_OTHER): Admitting: Radiology

## 2024-11-20 ENCOUNTER — Emergency Department (HOSPITAL_BASED_OUTPATIENT_CLINIC_OR_DEPARTMENT_OTHER)

## 2024-11-20 ENCOUNTER — Other Ambulatory Visit: Payer: Self-pay

## 2024-11-20 ENCOUNTER — Encounter (HOSPITAL_BASED_OUTPATIENT_CLINIC_OR_DEPARTMENT_OTHER): Payer: Self-pay

## 2024-11-20 ENCOUNTER — Emergency Department (HOSPITAL_BASED_OUTPATIENT_CLINIC_OR_DEPARTMENT_OTHER)
Admission: EM | Admit: 2024-11-20 | Discharge: 2024-11-21 | Disposition: A | Attending: Emergency Medicine | Admitting: Emergency Medicine

## 2024-11-20 ENCOUNTER — Ambulatory Visit
Admission: EM | Admit: 2024-11-20 | Discharge: 2024-11-20 | Disposition: A | Discharge status: Other Acute Inpatient Hospital | Attending: Family Medicine | Admitting: Family Medicine

## 2024-11-20 DIAGNOSIS — R42 Dizziness and giddiness: Secondary | ICD-10-CM | POA: Diagnosis not present

## 2024-11-20 DIAGNOSIS — Z79899 Other long term (current) drug therapy: Secondary | ICD-10-CM | POA: Insufficient documentation

## 2024-11-20 DIAGNOSIS — I10 Essential (primary) hypertension: Secondary | ICD-10-CM | POA: Insufficient documentation

## 2024-11-20 DIAGNOSIS — Z91040 Latex allergy status: Secondary | ICD-10-CM | POA: Insufficient documentation

## 2024-11-20 LAB — URINALYSIS, ROUTINE W REFLEX MICROSCOPIC
Bilirubin Urine: NEGATIVE
Glucose, UA: NEGATIVE mg/dL
Hgb urine dipstick: NEGATIVE
Ketones, ur: NEGATIVE mg/dL
Nitrite: NEGATIVE
Specific Gravity, Urine: 1.024 (ref 1.005–1.030)
pH: 7 (ref 5.0–8.0)

## 2024-11-20 LAB — TROPONIN T, HIGH SENSITIVITY: Troponin T High Sensitivity: <15 ng/L (ref 0–19)

## 2024-11-20 LAB — CBC
HCT: 35.1 % — ABNORMAL LOW (ref 36.0–46.0)
Hemoglobin: 10.5 g/dL — ABNORMAL LOW (ref 12.0–15.0)
MCH: 19.7 pg — ABNORMAL LOW (ref 26.0–34.0)
MCHC: 29.9 g/dL — ABNORMAL LOW (ref 30.0–36.0)
MCV: 65.7 fL — ABNORMAL LOW (ref 80.0–100.0)
Platelets: 496 K/uL — ABNORMAL HIGH (ref 150–400)
RBC: 5.34 MIL/uL — ABNORMAL HIGH (ref 3.87–5.11)
RDW: 21 % — ABNORMAL HIGH (ref 11.5–15.5)
WBC: 10.2 K/uL (ref 4.0–10.5)
nRBC: 0 % (ref 0.0–0.2)

## 2024-11-20 LAB — LIPASE, BLOOD: Lipase: 25 U/L (ref 11–51)

## 2024-11-20 LAB — COMPREHENSIVE METABOLIC PANEL WITH GFR
ALT: 23 U/L (ref 0–44)
AST: 36 U/L (ref 15–41)
Albumin: 4.6 g/dL (ref 3.5–5.0)
Alkaline Phosphatase: 101 U/L (ref 38–126)
Anion gap: 13 (ref 5–15)
BUN: 13 mg/dL (ref 6–20)
CO2: 25 mmol/L (ref 22–32)
Calcium: 9.3 mg/dL (ref 8.9–10.3)
Chloride: 101 mmol/L (ref 98–111)
Creatinine, Ser: 0.49 mg/dL (ref 0.44–1.00)
GFR, Estimated: >60 mL/min (ref >60)
Glucose, Bld: 117 mg/dL — ABNORMAL HIGH (ref 70–99)
Potassium: 4.3 mmol/L (ref 3.5–5.1)
Sodium: 138 mmol/L (ref 135–145)
Total Bilirubin: 0.5 mg/dL (ref 0.0–1.2)
Total Protein: 8.5 g/dL — ABNORMAL HIGH (ref 6.5–8.1)

## 2024-11-20 LAB — RESP PANEL BY RT-PCR (RSV, FLU A&B, COVID)  RVPGX2
Influenza A by PCR: NEGATIVE
Influenza B by PCR: NEGATIVE
Resp Syncytial Virus by PCR: NEGATIVE
SARS Coronavirus 2 by RT PCR: NEGATIVE

## 2024-11-20 LAB — PREGNANCY, URINE: Preg Test, Ur: NEGATIVE

## 2024-11-20 MED ORDER — MECLIZINE HCL 25 MG PO TABS
25.0000 mg | ORAL_TABLET | Freq: Once | ORAL | Status: AC
  Administered 2024-11-20: 25 mg via ORAL
  Filled 2024-11-20: qty 1

## 2024-11-20 MED ORDER — PROCHLORPERAZINE EDISYLATE 10 MG/2ML IJ SOLN
10.0000 mg | Freq: Once | INTRAMUSCULAR | Status: AC
  Administered 2024-11-20: 10 mg via INTRAVENOUS
  Filled 2024-11-20: qty 2

## 2024-11-20 MED ORDER — KETOROLAC TROMETHAMINE 15 MG/ML IJ SOLN
15.0000 mg | Freq: Once | INTRAMUSCULAR | Status: AC
  Administered 2024-11-20: 15 mg via INTRAVENOUS
  Filled 2024-11-20: qty 1

## 2024-11-20 MED ORDER — LISINOPRIL-HYDROCHLOROTHIAZIDE 10-12.5 MG PO TABS: 1.0000 | ORAL_TABLET | Freq: Every day | ORAL | 0 refills | Status: AC

## 2024-11-20 MED ORDER — ONDANSETRON 4 MG PO TBDP
4.0000 mg | ORAL_TABLET | Freq: Once | ORAL | Status: AC
  Administered 2024-11-20: 4 mg via ORAL
  Filled 2024-11-20: qty 1

## 2024-11-20 MED ORDER — HYDROCHLOROTHIAZIDE 25 MG PO TABS
12.5000 mg | ORAL_TABLET | Freq: Once | ORAL | Status: AC
  Administered 2024-11-20: 12.5 mg via ORAL
  Filled 2024-11-20: qty 1

## 2024-11-20 MED ORDER — LISINOPRIL 10 MG PO TABS
10.0000 mg | ORAL_TABLET | Freq: Once | ORAL | Status: AC
  Administered 2024-11-20: 10 mg via ORAL
  Filled 2024-11-20: qty 1

## 2024-11-20 MED ORDER — DIPHENHYDRAMINE HCL 50 MG/ML IJ SOLN
25.0000 mg | Freq: Once | INTRAMUSCULAR | Status: AC
  Administered 2024-11-20: 25 mg via INTRAVENOUS
  Filled 2024-11-20: qty 1

## 2024-11-20 MED ORDER — SODIUM CHLORIDE 0.9 % IV BOLUS
500.0000 mL | Freq: Once | INTRAVENOUS | Status: AC
  Administered 2024-11-20: 500 mL via INTRAVENOUS

## 2024-11-20 MED ORDER — ONDANSETRON 4 MG PO TBDP
4.0000 mg | ORAL_TABLET | Freq: Once | ORAL | Status: AC
  Administered 2024-11-20: 4 mg via ORAL

## 2024-11-20 MED ORDER — MECLIZINE HCL 25 MG PO TABS: 25.0000 mg | ORAL_TABLET | Freq: Three times a day (TID) | ORAL | 0 refills | Status: DC | PRN

## 2024-11-20 MED ORDER — MECLIZINE HCL 25 MG PO TABS
25.0000 mg | ORAL_TABLET | Freq: Once | ORAL | Status: AC
  Administered 2024-11-20: 25 mg via ORAL

## 2024-11-20 NOTE — ED Notes (Signed)
 Patient is being discharged from the Urgent Care and sent to the Emergency Department via POV . Per Myla Bold, NP, patient is in need of higher level of care due to needing further testing. Patient is aware and verbalizes understanding of plan of care.  Vitals:   11/20/24 1735  BP: (!) 163/66  Pulse: 69  Resp: 16  Temp: 98.1 F (36.7 C)  SpO2: 99%

## 2024-11-20 NOTE — Discharge Instructions (Addendum)
 Take blood pressure medication as prescribed. May take meclizine 2-3 times a day as needed for dizziness. Please follow-up with primary care doctor in 1 week for recheck and blood pressure management. Return to ED for worsening symptoms including uncontrollable headache, vision loss, syncopal episode, chest pain, shortness of breath.

## 2024-11-20 NOTE — ED Triage Notes (Signed)
 Pt present with c/o dizziness x 1 day. Pt states it stared when she woke up this morning, states she has chills, feels cold and feels shaky. Pt states she has headaches and feels pressure in her chest.

## 2024-11-20 NOTE — ED Provider Notes (Signed)
 [facility]    CSN: [id number] Arrival date & time: 11/20/24  1714      History   Chief Complaint Chief Complaint  Patient presents with   Chills   Headache   Dizziness    HPI Jennifer Holmes is a 38 y.o. female presents with dizziness.  Patient reports this morning she developed a persistent dizziness that she describes as the room spinning.  It is worse/occurs with head turning or position change.  It is associated with nausea, chills, headache.  Denies worst headache of life and rates it as an 8 out of 10.  She denies vomiting, syncope, visual changes, unilateral weakness, shortness of breath or chest pain/pressure.  States she has had this in the past and she just goes to the hospital and they give her medicine to make it stop.  She does have a history of hypertension and states she has been taking her BP medication regularly.  Denies any URI symptoms such as cough/congestion.  No other concerns at this time.   Headache Associated symptoms: dizziness   Dizziness Associated symptoms: headaches     Past Medical History:  Diagnosis Date   Gastroesophageal Reflux Disease (GERD)    Headache    HELLP syndrome    History of blood transfusion for PP hemorrhage    Postoperative Nausea and Vomiting (PONV)    Preterm labor     Patient Active Problem List   Diagnosis Date Noted   Encounter for postpartum visit 09/04/2021   Preeclampsia 07/21/2021   Vaginal delivery 07/21/2021   ASCUS of cervix with negative high risk HPV 02/08/2021   Atypical glandular cells of undetermined significance (AGUS) on cervical Pap smear 02/08/2021   Supervision of high risk pregnancy, antepartum 01/11/2021   History of preterm delivery 01/11/2021   Hx successful VBAC (vaginal birth after cesarean), currently pregnant 01/11/2021   History of cesarean delivery 01/11/2021   Vitamin D  deficiency 07/11/2020   History of severe pre-eclampsia 01/27/2015   Chronic hypertension  affecting pregnancy    Hepatic steatosis 08/23/2012    Past Surgical History:  Procedure Laterality Date   CESAREAN SECTION N/A 03/01/2008    OB History     Gravida  4   Para  4   Term  2   Preterm  2   AB  0   Living  4      SAB  0   IAB  0   Ectopic  0   Multiple  0   Live Births  4        Obstetric Comments  Had a blood transfusion with first pregnancy after her C/S in [place] .          Home Medications    Prior to Admission medications  Medication Sig Start Date End Date Taking? Authorizing Provider  acetaminophen  (TYLENOL ) 325 MG tablet Take 2 tablets (650 mg total) by mouth every 6 (six) hours as needed for moderate pain. 10/15/22   Christopher Savannah, PA-C  amoxicillin  (AMOXIL ) 875 MG tablet Take 1 tablet (875 mg total) by mouth 2 (two) times daily. 05/26/24   Christopher Savannah, PA-C  azelastine  (OPTIVAR ) 0.05 % ophthalmic solution Place 1 drop into both eyes 2 (two) times daily. 05/26/24   Christopher Savannah, PA-C  benzonatate  (TESSALON ) 100 MG capsule Take 1 capsule (100 mg total) by mouth 3 (three) times daily as needed for cough. 10/15/22   Christopher Savannah, PA-C  cetirizine  (ZYRTEC  ALLERGY) 10 MG tablet Take 1 tablet (  10 mg total) by mouth daily. 05/26/24   Christopher Savannah, PA-C  erythromycin  ophthalmic ointment Place into both eyes 4 (four) times daily. 05/26/24   Christopher Savannah, PA-C  ibuprofen  (ADVIL ) 600 MG tablet Take 1 tablet (600 mg total) by mouth every 6 (six) hours as needed. 10/15/22   Christopher Savannah, PA-C  lisinopril -hydrochlorothiazide  (ZESTORETIC ) 10-12.5 MG tablet Take 1 tablet by mouth daily. 05/26/24   Christopher Savannah, PA-C  promethazine -dextromethorphan (PROMETHAZINE -DM) 6.25-15 MG/5ML syrup Take 2.5 mLs by mouth 3 (three) times daily as needed for cough. 10/15/22   Christopher Savannah, PA-C  pseudoephedrine  (SUDAFED) 30 MG tablet Take 1 tablet (30 mg total) by mouth every 8 (eight) hours as needed for congestion. 10/15/22   Christopher Savannah, PA-C    Family History Family History   Problem Relation Age of Onset   Hypertension Mother    Heart disease Mother    Hypertension Brother     Social History Social History[1]   Allergies   Latex and Lactose intolerance (gi)   Review of Systems Review of Systems  Neurological:  Positive for dizziness and headaches.     Physical Exam Triage Vital Signs ED Triage Vitals [11/20/24 1735]  Encounter Vitals Group     BP (!) 163/66     Girls Systolic BP Percentile      Girls Diastolic BP Percentile      Boys Systolic BP Percentile      Boys Diastolic BP Percentile      Pulse Rate 69     Resp 16     Temp 98.1 F (36.7 C)     Temp src      SpO2 99 %     Weight      Height      Head Circumference      Peak Flow      Pain Score 3     Pain Loc      Pain Education      Exclude from Growth Chart    No data found.  Updated Vital Signs BP (!) 163/66   Pulse 69   Temp 98.1 F (36.7 C)   Resp 16   LMP 10/26/2024 (Exact Date)   SpO2 99%   Breastfeeding No   Visual Acuity Right Eye Distance:   Left Eye Distance:   Bilateral Distance:    Right Eye Near:   Left Eye Near:    Bilateral Near:     Physical Exam Vitals and nursing note reviewed.  Constitutional:      General: She is not in acute distress.    Appearance: She is not toxic-appearing or diaphoretic.  HENT:     Head: Normocephalic and atraumatic.     Right Ear: Tympanic membrane and ear canal normal.     Left Ear: Tympanic membrane and ear canal normal.  Eyes:     Extraocular Movements: Extraocular movements intact.     Conjunctiva/sclera: Conjunctivae normal.     Pupils: Pupils are equal, round, and reactive to light.  Cardiovascular:     Rate and Rhythm: Normal rate and regular rhythm.     Heart sounds: Normal heart sounds.  Pulmonary:     Effort: Pulmonary effort is normal.     Breath sounds: Normal breath sounds.  Skin:    General: Skin is warm and dry.  Neurological:     General: No focal deficit present.     Mental Status:  She is alert and oriented to person, place, and time.  GCS: GCS eye subscore is 4. GCS verbal subscore is 5. GCS motor subscore is 6.     Cranial Nerves: No facial asymmetry.     Motor: No weakness.     Coordination: Romberg sign positive. Finger-Nose-Finger Test normal.     Comments: Unable to do tandem walk due to dizziness  Psychiatric:        Mood and Affect: Mood normal.        Behavior: Behavior normal.      UC Treatments / Results  Labs (all labs ordered are listed, but only abnormal results are displayed) Labs Reviewed - No data to display  EKG   Radiology No results found.  Procedures Procedures (including critical care time)  Medications Ordered in UC Medications  ondansetron  (ZOFRAN -ODT) disintegrating tablet 4 mg (4 mg Oral Given 11/20/24 1802)  meclizine (ANTIVERT) tablet 25 mg (25 mg Oral Given 11/20/24 1809)    Initial Impression / Assessment and Plan / UC Course  I have reviewed the triage vital signs and the nursing notes.  Pertinent labs & imaging results that were available during my care of the patient were reviewed by me and considered in my medical decision making (see chart for details).     Reviewed exam and symptoms with patient.  Patient presenting with dizziness that she states is consistent with previous vertigo episodes.  Nurse intake note states chest pressure but when asked patient reports she does not have chest pain or pressure it is only her nausea.  She was given Zofran  and meclizine in clinic.  Patient was monitored for 45 minutes and states that her nausea is better but her dizziness is worsening.  Given my limited abilities in this setting I advised emergency room for further evaluation and treatment.  Patient is in agreement with plan will go POV to the ER with her husband driving. Final Clinical Impressions(s) / UC Diagnoses   Final diagnoses:  Dizziness     Discharge Instructions      Please go to the emergency room for  further evaluation of your dizziness     ED Prescriptions   None    PDMP not reviewed this encounter.     [1]  Social History Tobacco Use   Smoking status: Never   Smokeless tobacco: Never  Vaping Use   Vaping status: Never Used  Substance Use Topics   Alcohol use: No   Drug use: No     Loreda Myla SAUNDERS, NP 11/20/24 1847

## 2024-11-20 NOTE — ED Provider Notes (Signed)
 [place] EMERGENCY DEPARTMENT AT [name] Provider Note   CSN: [id number] Arrival date & time: 11/20/24  8084     Patient presents with: Nausea   Jennifer Holmes is a 39 y.o. female.     Patient is a 38 year old female with a history of hypertension who presents to the ED for increasing dizziness with nausea that began when she woke up this morning.  Patient notes the room was spinning when she woke up this morning.  She notes that symptoms continued to get worse this afternoon prompting her to go to urgent care.  She was sent to ED for evaluation.  Notes she threw up 3 times this afternoon that was straight acid.  She states she has been taking her blood pressure medication twice daily as prescribed.  Notes she did take her medicine this morning.  States she is having a headache as well as some blurred vision.  Notes she has had this happen previously with her blood pressure where she gets dizzy with a headache but not often.  Denies syncope, vision loss, numbness/tingling/weakness, chest pain, shortness of breath, abdominal pain, urinary symptoms.  No further complaints.        Prior to Admission medications  Medication Sig Start Date End Date Taking? Authorizing Provider  meclizine (ANTIVERT) 25 MG tablet Take 1 tablet (25 mg total) by mouth 3 (three) times daily as needed for dizziness. 11/20/24  Yes Ally Knodel, Thersia RAMAN, PA-C  acetaminophen  (TYLENOL ) 325 MG tablet Take 2 tablets (650 mg total) by mouth every 6 (six) hours as needed for moderate pain. 10/15/22   Christopher Savannah, PA-C  amoxicillin  (AMOXIL ) 875 MG tablet Take 1 tablet (875 mg total) by mouth 2 (two) times daily. 05/26/24   Christopher Savannah, PA-C  azelastine  (OPTIVAR ) 0.05 % ophthalmic solution Place 1 drop into both eyes 2 (two) times daily. 05/26/24   Christopher Savannah, PA-C  benzonatate  (TESSALON ) 100 MG capsule Take 1 capsule (100 mg total) by mouth 3 (three) times daily as needed for cough. 10/15/22   Christopher Savannah,  PA-C  cetirizine  (ZYRTEC  ALLERGY) 10 MG tablet Take 1 tablet (10 mg total) by mouth daily. 05/26/24   Christopher Savannah, PA-C  erythromycin  ophthalmic ointment Place into both eyes 4 (four) times daily. 05/26/24   Christopher Savannah, PA-C  ibuprofen  (ADVIL ) 600 MG tablet Take 1 tablet (600 mg total) by mouth every 6 (six) hours as needed. 10/15/22   Christopher Savannah, PA-C  lisinopril -hydrochlorothiazide  (ZESTORETIC ) 10-12.5 MG tablet Take 1 tablet by mouth daily. 11/20/24   Neysa Thersia RAMAN, PA-C  promethazine -dextromethorphan (PROMETHAZINE -DM) 6.25-15 MG/5ML syrup Take 2.5 mLs by mouth 3 (three) times daily as needed for cough. 10/15/22   Christopher Savannah, PA-C  pseudoephedrine  (SUDAFED) 30 MG tablet Take 1 tablet (30 mg total) by mouth every 8 (eight) hours as needed for congestion. 10/15/22   Christopher Savannah, PA-C    Allergies: Latex and Lactose intolerance (gi)    Review of Systems  Constitutional:  Negative for fever.  Eyes:  Positive for visual disturbance.  Respiratory:  Negative for shortness of breath.   Cardiovascular:  Negative for chest pain.  Neurological:  Positive for dizziness and headaches. Negative for syncope, weakness and light-headedness.  All other systems reviewed and are negative.   Updated Vital Signs BP (!) 198/101 (BP Location: Right Arm)   Pulse 71   Temp 97.6 F (36.4 C)   Resp 20   Ht 5' 1 (1.549 m)   Wt 90.7 kg  LMP 10/26/2024 (Exact Date)   SpO2 100%   BMI 37.79 kg/m   Physical Exam Constitutional:      Appearance: Normal appearance.  HENT:     Head: Normocephalic and atraumatic.     Nose: Nose normal.     Mouth/Throat:     Mouth: Mucous membranes are moist.     Pharynx: Oropharynx is clear.  Cardiovascular:     Rate and Rhythm: Normal rate.     Pulses: Normal pulses.     Comments: Radial pulses 2+ bilaterally Pulmonary:     Effort: Pulmonary effort is normal.     Breath sounds: Normal breath sounds.  Abdominal:     Tenderness: There is no abdominal tenderness.   Skin:    General: Skin is warm and dry.  Neurological:     Mental Status: She is alert and oriented to person, place, and time.     Cranial Nerves: No cranial nerve deficit.  Psychiatric:        Mood and Affect: Mood normal.        Behavior: Behavior normal.     (all labs ordered are listed, but only abnormal results are displayed) Labs Reviewed  COMPREHENSIVE METABOLIC PANEL WITH GFR - Abnormal; Notable for the following components:      Result Value   Glucose, Bld 117 (*)    Total Protein 8.5 (*)    All other components within normal limits  CBC - Abnormal; Notable for the following components:   RBC 5.34 (*)    Hemoglobin 10.5 (*)    HCT 35.1 (*)    MCV 65.7 (*)    MCH 19.7 (*)    MCHC 29.9 (*)    RDW 21.0 (*)    Platelets 496 (*)    All other components within normal limits  URINALYSIS, ROUTINE W REFLEX MICROSCOPIC - Abnormal; Notable for the following components:   APPearance HAZY (*)    Protein, ur TRACE (*)    Leukocytes,Ua TRACE (*)    Bacteria, UA RARE (*)    All other components within normal limits  RESP PANEL BY RT-PCR (RSV, FLU A&B, COVID)  RVPGX2  LIPASE, BLOOD  PREGNANCY, URINE  TROPONIN T, HIGH SENSITIVITY    EKG: EKG Interpretation Date/Time:  Friday November 20 2024 19:24:51 EST Ventricular Rate:  76 PR Interval:  158 QRS Duration:  84 QT Interval:  400 QTC Calculation: 450 R Axis:   32  Text Interpretation: Normal sinus rhythm Inverted T waves have replaced nonspecific T wave abnormality in Inferior leads T wave amplitude has increased in Lateral leads Confirmed by Ruthe Cornet [phone]) on 11/20/2024 7:40:45 PM  Radiology: [facility] Chest 2 View Result Date: 11/20/2024 EXAM: 2 VIEW(S) XRAY OF THE CHEST 11/20/2024 07:40:00 PM COMPARISON: Comparison study, 09/19/2020. CLINICAL HISTORY: Chest pain. FINDINGS: LUNGS AND PLEURA: No focal pulmonary opacity. No pleural effusion. No pneumothorax. HEART AND MEDIASTINUM: No acute abnormality of the cardiac  and mediastinal silhouettes. BONES AND SOFT TISSUES: No acute osseous abnormality. IMPRESSION: 1. No acute cardiopulmonary process. Electronically signed by: Oneil Devonshire MD 11/20/2024 07:52 PM EST RP Workstation: [id number]   CT Head Wo Contrast Result Date: 11/20/2024 EXAM: CT HEAD WITHOUT 11/20/2024 07:37:19 PM TECHNIQUE: CT of the head was performed without the administration of intravenous contrast. Automated exposure control, iterative reconstruction, and/or weight based adjustment of the mA/kV was utilized to reduce the radiation dose to as low as reasonably achievable. COMPARISON: head CT 08/18/2014 CLINICAL HISTORY: Headache, increasing frequency or severity FINDINGS: BRAIN  AND VENTRICLES: There is no evidence of an acute infarct, intracranial hemorrhage, mass, midline shift, hydrocephalus, or extra-axial fluid collection. Cerebral volume is normal. A partially empty sella is noted. ORBITS: No acute abnormality. SINUSES AND MASTOIDS: No acute abnormality. SOFT TISSUES AND SKULL: No acute skull fracture. No acute soft tissue abnormality. IMPRESSION: 1. No acute intracranial abnormality. 2. Partially empty sella, often reflecting normal anatomic variation though can also be seen with idiopathic intracranial hypertension. Electronically signed by: Dasie Hamburg MD 11/20/2024 07:46 PM EST RP Workstation: [id number]      Medications Ordered in the ED  meclizine (ANTIVERT) tablet 25 mg (has no administration in time range)  ketorolac (TORADOL) 15 MG/ML injection 15 mg (has no administration in time range)  prochlorperazine (COMPAZINE) injection 10 mg (has no administration in time range)  diphenhydrAMINE  (BENADRYL ) injection 25 mg (has no administration in time range)  meclizine (ANTIVERT) tablet 25 mg (25 mg Oral Given 11/20/24 2013)  ondansetron  (ZOFRAN -ODT) disintegrating tablet 4 mg (4 mg Oral Given 11/20/24 2013)  lisinopril  (ZESTRIL ) tablet 10 mg (10 mg Oral Given 11/20/24 2013)   hydrochlorothiazide  (HYDRODIURIL ) tablet 12.5 mg (12.5 mg Oral Given 11/20/24 2013)  sodium chloride  0.9 % bolus 500 mL (0 mLs Intravenous Stopped 11/20/24 2101)    Clinical Course as of 11/20/24 2200  Fri Nov 20, 2024  2006 Hemoglobin(!): 10.5 Similar to baseline [AY]  2120 Patient's blood pressure noted to be 158/90 after evening medication. [AY]    Clinical Course User Index [AY] Neysa Thersia RAMAN, PA-C                                Medical Decision Making Patient is a 38 year old female with a history of hypertension who presents to ED for increasing dizziness and headache that began when she woke up this morning. Please see detailed HPI above. On exam patient is alert and in no acute distress. Physical exam as noted above. No acute neurologic deficits noted. Differential includes electrolyte abnormality, viral illness, migraine, tension headache, hypertensive urgency/emergency, intracranial abnormality, TIA.   Lab workup overall unremarkable with no electrolyte abnormalities or leukocytosis. Viral swab negative. Troponin and UA unremarkable. Chest xray reviewed and negative for acute process. EKG shows sinus rhythm. CT head shows no acute abnormality. A partially empty sella is noted but less concerns for idiopathic intracranial htn as blood pressure noted to be improved to her baseline of around 160 systolic. Patient initially given normal lisinopril /hydrochlorothiazide , meclizine, 500ml fluid, and zofran . Notes nausea improved but still dizziness noted if moving her head. Headache improving as well. Suspect patient's symptoms most likely secondary to poorly controlled hypertension as she notes urgent care usually manages her medications. Notes she has not seen PCP in over a year. Less concerns for urgency/emergency this evening as blood pressure noted to be improving towards her baseline.   Plan to give migraine cocktail, 25mg  more meclizine, and reevaluate/ambulate. Patient signed out to  Ileana Eck, PA-C at shift change. Please see her note for further disposition.    Amount and/or Complexity of Data Reviewed Labs: ordered. Decision-making details documented in ED Course. Radiology: ordered.  Risk Prescription drug management.      Final diagnoses:  Dizziness  Hypertension, unspecified type    ED Discharge Orders          Ordered    meclizine (ANTIVERT) 25 MG tablet  3 times daily PRN  11/20/24 2137    lisinopril -hydrochlorothiazide  (ZESTORETIC ) 10-12.5 MG tablet  Daily        11/20/24 2137               Neysa Thersia RAMAN, PA-C 11/20/24 2200    Ruthe Cornet, DO 11/20/24 2230

## 2024-11-20 NOTE — Discharge Instructions (Signed)
 Please go to the emergency room for further evaluation of your dizziness

## 2024-11-20 NOTE — ED Triage Notes (Signed)
 Pt reports awaking this AM w/ dizziness and nausea. Pt denies any hx of vertigo. Pt also endorses chest pressure. Pt reports hx of HTN and is med compliant.

## 2024-11-21 ENCOUNTER — Emergency Department (HOSPITAL_COMMUNITY)

## 2024-11-21 ENCOUNTER — Encounter (HOSPITAL_BASED_OUTPATIENT_CLINIC_OR_DEPARTMENT_OTHER): Payer: Self-pay | Admitting: Emergency Medicine

## 2024-11-21 ENCOUNTER — Emergency Department (HOSPITAL_BASED_OUTPATIENT_CLINIC_OR_DEPARTMENT_OTHER)

## 2024-11-21 ENCOUNTER — Emergency Department (HOSPITAL_COMMUNITY)
Admission: EM | Admit: 2024-11-21 | Discharge: 2024-11-22 | Disposition: A | Discharge status: Home / Self Care | Source: Home / Self Care | Attending: Emergency Medicine | Admitting: Emergency Medicine

## 2024-11-21 DIAGNOSIS — R0902 Hypoxemia: Secondary | ICD-10-CM | POA: Diagnosis not present

## 2024-11-21 DIAGNOSIS — R42 Dizziness and giddiness: Secondary | ICD-10-CM

## 2024-11-21 DIAGNOSIS — R519 Headache, unspecified: Secondary | ICD-10-CM

## 2024-11-21 DIAGNOSIS — Z743 Need for continuous supervision: Secondary | ICD-10-CM | POA: Diagnosis not present

## 2024-11-21 LAB — URINALYSIS, ROUTINE W REFLEX MICROSCOPIC
Bilirubin Urine: NEGATIVE
Glucose, UA: NEGATIVE mg/dL
Hgb urine dipstick: NEGATIVE
Ketones, ur: NEGATIVE mg/dL
Leukocytes,Ua: NEGATIVE
Nitrite: NEGATIVE
Protein, ur: NEGATIVE mg/dL
Specific Gravity, Urine: 1.023 (ref 1.005–1.030)
pH: 6 (ref 5.0–8.0)

## 2024-11-21 LAB — CBC
HCT: 33 % — ABNORMAL LOW (ref 36.0–46.0)
Hemoglobin: 9.7 g/dL — ABNORMAL LOW (ref 12.0–15.0)
MCH: 19.3 pg — ABNORMAL LOW (ref 26.0–34.0)
MCHC: 29.4 g/dL — ABNORMAL LOW (ref 30.0–36.0)
MCV: 65.6 fL — ABNORMAL LOW (ref 80.0–100.0)
Platelets: 486 K/uL — ABNORMAL HIGH (ref 150–400)
RBC: 5.03 MIL/uL (ref 3.87–5.11)
RDW: 20.8 % — ABNORMAL HIGH (ref 11.5–15.5)
WBC: 7.4 K/uL (ref 4.0–10.5)
nRBC: 0 % (ref 0.0–0.2)

## 2024-11-21 LAB — COMPREHENSIVE METABOLIC PANEL WITH GFR
ALT: 22 U/L (ref 0–44)
AST: 24 U/L (ref 15–41)
Albumin: 4.2 g/dL (ref 3.5–5.0)
Alkaline Phosphatase: 93 U/L (ref 38–126)
Anion gap: 13 (ref 5–15)
BUN: 13 mg/dL (ref 6–20)
CO2: 21 mmol/L — ABNORMAL LOW (ref 22–32)
Calcium: 8.8 mg/dL — ABNORMAL LOW (ref 8.9–10.3)
Chloride: 104 mmol/L (ref 98–111)
Creatinine, Ser: 0.49 mg/dL (ref 0.44–1.00)
GFR, Estimated: >60 mL/min (ref >60)
Glucose, Bld: 126 mg/dL — ABNORMAL HIGH (ref 70–99)
Potassium: 3.7 mmol/L (ref 3.5–5.1)
Sodium: 138 mmol/L (ref 135–145)
Total Bilirubin: 0.6 mg/dL (ref 0.0–1.2)
Total Protein: 7.7 g/dL (ref 6.5–8.1)

## 2024-11-21 LAB — LIPASE, BLOOD: Lipase: 20 U/L (ref 11–51)

## 2024-11-21 LAB — CBG MONITORING, ED: Glucose-Capillary: 113 mg/dL — ABNORMAL HIGH (ref 70–99)

## 2024-11-21 LAB — PREGNANCY, URINE: Preg Test, Ur: NEGATIVE

## 2024-11-21 LAB — SEDIMENTATION RATE: Sed Rate: 17 mm/h (ref 0–22)

## 2024-11-21 MED ORDER — DIPHENHYDRAMINE HCL 50 MG/ML IJ SOLN
12.5000 mg | Freq: Once | INTRAMUSCULAR | Status: AC
  Administered 2024-11-21: 12.5 mg via INTRAVENOUS
  Filled 2024-11-21: qty 1

## 2024-11-21 MED ORDER — METOCLOPRAMIDE HCL 5 MG/ML IJ SOLN
10.0000 mg | Freq: Once | INTRAMUSCULAR | Status: AC
  Administered 2024-11-21: 10 mg via INTRAVENOUS
  Filled 2024-11-21: qty 2

## 2024-11-21 MED ORDER — SODIUM CHLORIDE 0.9 % IV BOLUS
1000.0000 mL | Freq: Once | INTRAVENOUS | Status: AC
  Administered 2024-11-21: 1000 mL via INTRAVENOUS

## 2024-11-21 MED ORDER — IOHEXOL 350 MG/ML SOLN
75.0000 mL | Freq: Once | INTRAVENOUS | Status: AC | PRN
  Administered 2024-11-21: 75 mL via INTRAVENOUS

## 2024-11-21 MED ORDER — KETOROLAC TROMETHAMINE 15 MG/ML IJ SOLN
15.0000 mg | Freq: Once | INTRAMUSCULAR | Status: AC
  Administered 2024-11-21: 15 mg via INTRAVENOUS
  Filled 2024-11-21: qty 1

## 2024-11-21 MED ORDER — GADOBUTROL 1 MMOL/ML IV SOLN
9.0000 mL | Freq: Once | INTRAVENOUS | Status: AC | PRN
  Administered 2024-11-21: 9 mL via INTRAVENOUS

## 2024-11-21 NOTE — ED Triage Notes (Signed)
 Reports dizziness and nausea since yesterday. Seen here yesterday for same. States symptoms are getting worse.

## 2024-11-21 NOTE — ED Provider Notes (Signed)
 [place] EMERGENCY DEPARTMENT AT [facility] Provider Note   CSN: [id number] Arrival date & time: 11/21/24  1512     Patient presents with: Emesis   Jennifer Holmes is a 38 y.o. female with primary concern of generalized weakness along with increased dizziness and nausea since yesterday.  Previously seen in this facility yesterday, states that she has had worsening of her condition despite medications given at that visit.  She was provided with meclizine  for management of her dizziness as well as lisinopril  HCTZ which has previous prescription for her blood pressure.  Continues to have nausea and vomiting, persistent dizziness, also complains of global headache.  Continues to have increased lacrimation, as well as blurred vision and photophobia.  Review of CT obtained yesterday shows partially empty sella though no other acute findings, consider possible idiopathic intracranial hypertension at that time.    Emesis      Prior to Admission medications  Medication Sig Start Date End Date Taking? Authorizing Provider  acetaminophen  (TYLENOL ) 325 MG tablet Take 2 tablets (650 mg total) by mouth every 6 (six) hours as needed for moderate pain. 10/15/22   Christopher Savannah, PA-C  amoxicillin  (AMOXIL ) 875 MG tablet Take 1 tablet (875 mg total) by mouth 2 (two) times daily. 05/26/24   Christopher Savannah, PA-C  azelastine  (OPTIVAR ) 0.05 % ophthalmic solution Place 1 drop into both eyes 2 (two) times daily. 05/26/24   Christopher Savannah, PA-C  benzonatate  (TESSALON ) 100 MG capsule Take 1 capsule (100 mg total) by mouth 3 (three) times daily as needed for cough. 10/15/22   Christopher Savannah, PA-C  cetirizine  (ZYRTEC  ALLERGY) 10 MG tablet Take 1 tablet (10 mg total) by mouth daily. 05/26/24   Christopher Savannah, PA-C  erythromycin  ophthalmic ointment Place into both eyes 4 (four) times daily. 05/26/24   Christopher Savannah, PA-C  ibuprofen  (ADVIL ) 600 MG tablet Take 1 tablet (600 mg total) by mouth every 6 (six) hours as  needed. 10/15/22   Christopher Savannah, PA-C  lisinopril -hydrochlorothiazide  (ZESTORETIC ) 10-12.5 MG tablet Take 1 tablet by mouth daily. 11/20/24   Neysa Thersia RAMAN, PA-C  meclizine  (ANTIVERT ) 25 MG tablet Take 1 tablet (25 mg total) by mouth 3 (three) times daily as needed for dizziness. 11/20/24   Neysa Thersia RAMAN, PA-C  promethazine -dextromethorphan (PROMETHAZINE -DM) 6.25-15 MG/5ML syrup Take 2.5 mLs by mouth 3 (three) times daily as needed for cough. 10/15/22   Christopher Savannah, PA-C  pseudoephedrine  (SUDAFED) 30 MG tablet Take 1 tablet (30 mg total) by mouth every 8 (eight) hours as needed for congestion. 10/15/22   Christopher Savannah, PA-C    Allergies: Latex and Lactose intolerance (gi)    Review of Systems  Gastrointestinal:  Positive for nausea and vomiting.  Neurological:  Positive for dizziness and weakness.  All other systems reviewed and are negative.   Updated Vital Signs BP (!) 171/96   Pulse 71   Temp 97.9 F (36.6 C)   Resp 16   LMP 10/26/2024 (Exact Date)   SpO2 99%   Physical Exam Vitals and nursing note reviewed.  Constitutional:      General: She is not in acute distress.    Appearance: Normal appearance.  HENT:     Head: Normocephalic and atraumatic.     Mouth/Throat:     Mouth: Mucous membranes are moist.     Pharynx: Oropharynx is clear.  Eyes:     General: Lids are normal. Vision grossly intact. Gaze aligned appropriately.     Extraocular Movements: Extraocular movements  intact.     Right eye: Nystagmus present.     Left eye: Nystagmus present.     Conjunctiva/sclera: Conjunctivae normal.     Pupils: Pupils are equal, round, and reactive to light.     Comments: Bilateral photophobia appreciated along with increased lacrimation bilaterally.  Normal-appearing bilateral conjunctiva.  Vertical nystagmus noted bilaterally.  Cardiovascular:     Rate and Rhythm: Normal rate and regular rhythm.     Pulses: Normal pulses.     Heart sounds: Normal heart sounds. No murmur heard.     No friction rub. No gallop.  Pulmonary:     Effort: Pulmonary effort is normal.     Breath sounds: Normal breath sounds.  Abdominal:     General: Abdomen is flat. Bowel sounds are normal.     Palpations: Abdomen is soft.  Musculoskeletal:        General: Normal range of motion.     Cervical back: Normal range of motion and neck supple.     Right lower leg: No edema.     Left lower leg: No edema.  Lymphadenopathy:     Cervical: No cervical adenopathy.  Skin:    General: Skin is warm and dry.     Capillary Refill: Capillary refill takes less than 2 seconds.  Neurological:     General: No focal deficit present.     Mental Status: She is alert and oriented to person, place, and time. Mental status is at baseline.     GCS: GCS eye subscore is 4. GCS verbal subscore is 5. GCS motor subscore is 6.     Cranial Nerves: No cranial nerve deficit or facial asymmetry.     Sensory: No sensory deficit.  Psychiatric:        Mood and Affect: Mood normal.     (all labs ordered are listed, but only abnormal results are displayed) Labs Reviewed  COMPREHENSIVE METABOLIC PANEL WITH GFR - Abnormal; Notable for the following components:      Result Value   CO2 21 (*)    Glucose, Bld 126 (*)    Calcium  8.8 (*)    All other components within normal limits  CBC - Abnormal; Notable for the following components:   Hemoglobin 9.7 (*)    HCT 33.0 (*)    MCV 65.6 (*)    MCH 19.3 (*)    MCHC 29.4 (*)    RDW 20.8 (*)    Platelets 486 (*)    All other components within normal limits  CBG MONITORING, ED - Abnormal; Notable for the following components:   Glucose-Capillary 113 (*)    All other components within normal limits  LIPASE, BLOOD  URINALYSIS, ROUTINE W REFLEX MICROSCOPIC  PREGNANCY, URINE  SEDIMENTATION RATE    EKG: None  Radiology: CT VENOGRAM HEAD Result Date: 11/21/2024 EXAM: CT VENOGRAM WITH CONTRAST 11/21/2024 07:36:59 PM TECHNIQUE: CT venogram of the head/brain was performed  with the administration of 75 mL iohexol  (OMNIPAQUE ) 350 MG/ML injection. Multiplanar reformatted images are provided for review. MIP images are provided for review. Automated exposure control, iterative reconstruction, and/or weight based adjustment of the mA/kV was utilized to reduce the radiation dose to as low as reasonably achievable. COMPARISON: None available. CLINICAL HISTORY: Headache, increasing frequency or severity. FINDINGS: BRAIN/VENTRICLES: No acute intracranial hemorrhage. No extra axial fluid collection. Gray-white differentiation is maintained. No mass effect or midline shift. No hydrocephalus. ORBITS: No acute abnormality. SINUSES AND MASTOIDS: No acute abnormality. SOFT TISSUES AND SKULL: No acute  abnormality. CT VENOGRAM: No dural venous sinus thrombosis. No significant stenosis. IMPRESSION: 1. No dural venous sinus thrombosis. 2. No acute intracranial abnormality. Electronically signed by: Franky Stanford MD 11/21/2024 07:48 PM EST RP Workstation: [id number]   DG Chest 2 View Result Date: 11/20/2024 EXAM: 2 VIEW(S) XRAY OF THE CHEST 11/20/2024 07:40:00 PM COMPARISON: Comparison study, 09/19/2020. CLINICAL HISTORY: Chest pain. FINDINGS: LUNGS AND PLEURA: No focal pulmonary opacity. No pleural effusion. No pneumothorax. HEART AND MEDIASTINUM: No acute abnormality of the cardiac and mediastinal silhouettes. BONES AND SOFT TISSUES: No acute osseous abnormality. IMPRESSION: 1. No acute cardiopulmonary process. Electronically signed by: Oneil Devonshire MD 11/20/2024 07:52 PM EST RP Workstation: [id number]   CT Head Wo Contrast Result Date: 11/20/2024 EXAM: CT HEAD WITHOUT 11/20/2024 07:37:19 PM TECHNIQUE: CT of the head was performed without the administration of intravenous contrast. Automated exposure control, iterative reconstruction, and/or weight based adjustment of the mA/kV was utilized to reduce the radiation dose to as low as reasonably achievable. COMPARISON: head CT 08/18/2014 CLINICAL  HISTORY: Headache, increasing frequency or severity FINDINGS: BRAIN AND VENTRICLES: There is no evidence of an acute infarct, intracranial hemorrhage, mass, midline shift, hydrocephalus, or extra-axial fluid collection. Cerebral volume is normal. A partially empty sella is noted. ORBITS: No acute abnormality. SINUSES AND MASTOIDS: No acute abnormality. SOFT TISSUES AND SKULL: No acute skull fracture. No acute soft tissue abnormality. IMPRESSION: 1. No acute intracranial abnormality. 2. Partially empty sella, often reflecting normal anatomic variation though can also be seen with idiopathic intracranial hypertension. Electronically signed by: Dasie Hamburg MD 11/20/2024 07:46 PM EST RP Workstation: [id number]     Procedures   Medications Ordered in the ED  ketorolac  (TORADOL ) 15 MG/ML injection 15 mg (15 mg Intravenous Given 11/21/24 1835)  metoCLOPramide  (REGLAN ) injection 10 mg (10 mg Intravenous Given 11/21/24 1835)  diphenhydrAMINE  (BENADRYL ) injection 12.5 mg (12.5 mg Intravenous Given 11/21/24 1834)  sodium chloride  0.9 % bolus 1,000 mL (1,000 mLs Intravenous New Bag/Given 11/21/24 1837)  iohexol  (OMNIPAQUE ) 350 MG/ML injection 75 mL (75 mLs Intravenous Contrast Given 11/21/24 1937)                                    Medical Decision Making Amount and/or Complexity of Data Reviewed Labs: ordered. Radiology: ordered.  Risk Prescription drug management.   Medical Decision Making:   Jennifer Holmes is a 38 y.o. female who presented to the ED today with headache, photophobia, vertigo detailed above.    External chart has been reviewed including previous labs and imaging. Patient placed on continuous vitals and telemetry monitoring while in ED which was reviewed periodically.  Complete initial physical exam performed, notably the patient  was alert and oriented no apparent distress, notable nystagmus on exam as noted in the physical exam, as well as photophobia and increased  lacrimation.    Reviewed and confirmed nursing documentation for past medical history, family history, social history.    Initial Assessment:   With the patient's presentation of headache and vertigo, consider possible complex migraines, cerebral venous thrombosis, multiple sclerosis, CVA.   Initial Plan:  Obtain CT venogram of the head to evaluate for cerebral venous thrombosis. Screening labs including CBC and Metabolic panel to evaluate for infectious or metabolic etiology of disease.  Urinalysis with reflex culture ordered to evaluate for UTI or relevant urologic/nephrologic pathology.  Evaluate ESR to evaluate for arteritis/neuritis. Objective evaluation as below reviewed   Initial Study  Results:   Laboratory  All laboratory results reviewed without evidence of clinically relevant pathology.   Exceptions include: Hemoglobin is 9.7 with MCV 65.6.  Review of previous findings shows that patient has chronically decreased hemoglobin that appears microcytic  Radiology:  All images reviewed independently. Agree with radiology report at this time.   CT VENOGRAM HEAD Result Date: 11/21/2024 EXAM: CT VENOGRAM WITH CONTRAST 11/21/2024 07:36:59 PM TECHNIQUE: CT venogram of the head/brain was performed with the administration of 75 mL iohexol  (OMNIPAQUE ) 350 MG/ML injection. Multiplanar reformatted images are provided for review. MIP images are provided for review. Automated exposure control, iterative reconstruction, and/or weight based adjustment of the mA/kV was utilized to reduce the radiation dose to as low as reasonably achievable. COMPARISON: None available. CLINICAL HISTORY: Headache, increasing frequency or severity. FINDINGS: BRAIN/VENTRICLES: No acute intracranial hemorrhage. No extra axial fluid collection. Gray-white differentiation is maintained. No mass effect or midline shift. No hydrocephalus. ORBITS: No acute abnormality. SINUSES AND MASTOIDS: No acute abnormality. SOFT TISSUES AND  SKULL: No acute abnormality. CT VENOGRAM: No dural venous sinus thrombosis. No significant stenosis. IMPRESSION: 1. No dural venous sinus thrombosis. 2. No acute intracranial abnormality. Electronically signed by: Franky Stanford MD 11/21/2024 07:48 PM EST RP Workstation: [id number]   DG Chest 2 View Result Date: 11/20/2024 EXAM: 2 VIEW(S) XRAY OF THE CHEST 11/20/2024 07:40:00 PM COMPARISON: Comparison study, 09/19/2020. CLINICAL HISTORY: Chest pain. FINDINGS: LUNGS AND PLEURA: No focal pulmonary opacity. No pleural effusion. No pneumothorax. HEART AND MEDIASTINUM: No acute abnormality of the cardiac and mediastinal silhouettes. BONES AND SOFT TISSUES: No acute osseous abnormality. IMPRESSION: 1. No acute cardiopulmonary process. Electronically signed by: Oneil Devonshire MD 11/20/2024 07:52 PM EST RP Workstation: [id number]   CT Head Wo Contrast Result Date: 11/20/2024 EXAM: CT HEAD WITHOUT 11/20/2024 07:37:19 PM TECHNIQUE: CT of the head was performed without the administration of intravenous contrast. Automated exposure control, iterative reconstruction, and/or weight based adjustment of the mA/kV was utilized to reduce the radiation dose to as low as reasonably achievable. COMPARISON: head CT 08/18/2014 CLINICAL HISTORY: Headache, increasing frequency or severity FINDINGS: BRAIN AND VENTRICLES: There is no evidence of an acute infarct, intracranial hemorrhage, mass, midline shift, hydrocephalus, or extra-axial fluid collection. Cerebral volume is normal. A partially empty sella is noted. ORBITS: No acute abnormality. SINUSES AND MASTOIDS: No acute abnormality. SOFT TISSUES AND SKULL: No acute skull fracture. No acute soft tissue abnormality. IMPRESSION: 1. No acute intracranial abnormality. 2. Partially empty sella, often reflecting normal anatomic variation though can also be seen with idiopathic intracranial hypertension. Electronically signed by: Dasie Hamburg MD 11/20/2024 07:46 PM EST RP Workstation:  [id number]      Consults: Case discussed with neurology, Dr. Lindzen.   Reassessment and Plan:   Secondary to the physical exam findings of vertical nystagmus, persistent headache, photophobia, consulted with Dr. Merrianne who recommends ED to ED transfer for MRI with and without contrast.  Evaluate for possible occipital CVA, brainstem lesion, multiple sclerosis, and/or complex migraines.  Discussed this with the patient and they understand agree No further concerns, consent to transfer.  Report given to S.Elnor, DO at [facility] who accepts patient for ED to ED transfer for further neurologic evaluation.       Final diagnoses:  Vertigo  Nonintractable headache, unspecified chronicity pattern, unspecified headache type    ED Discharge Orders     None          Myriam Dorn BROCKS, PA 11/21/24 [id number]    Jerrol Agent, MD 11/21/24 2319

## 2024-11-21 NOTE — ED Notes (Signed)
 Patient transported to MRI

## 2024-11-21 NOTE — ED Notes (Signed)
 Pt en route to [facility] w/ [facility]

## 2024-11-21 NOTE — ED Triage Notes (Signed)
 BIB [facility] from [facility] ED. Per [facility] pt was seen twice at [facility] with complaints of migraine and vertigo. States patient received a migraine cocktail x2 at [facility] with little relief in pain. States pt is here for MRI due to CT at [facility] being clear. Pt arrives with 20g in R AC.   EMS VS: 132/82 91 HR 100% RA 15 RR

## 2024-11-21 NOTE — ED Notes (Signed)
 Handoff report given to [facility] RN charge a [facility].

## 2024-11-21 NOTE — ED Notes (Signed)
 Ice provided to patient. Okay'd by provider.

## 2024-11-22 MED ORDER — PROCHLORPERAZINE EDISYLATE 10 MG/2ML IJ SOLN
10.0000 mg | Freq: Once | INTRAMUSCULAR | Status: AC
  Administered 2024-11-22: 10 mg via INTRAVENOUS
  Filled 2024-11-22: qty 2

## 2024-11-22 MED ORDER — MAGNESIUM SULFATE 2 GM/50ML IV SOLN: 2.0000 g | INTRAVENOUS | Status: DC

## 2024-11-22 MED ORDER — MAGNESIUM SULFATE IN D5W 1-5 GM/100ML-% IV SOLN
1.0000 g | INTRAVENOUS | Status: AC
  Administered 2024-11-22: 1 g via INTRAVENOUS
  Filled 2024-11-22: qty 100

## 2024-11-22 MED ORDER — PROCHLORPERAZINE MALEATE 10 MG PO TABS: 10.0000 mg | ORAL_TABLET | Freq: Two times a day (BID) | ORAL | 0 refills | Status: DC | PRN

## 2024-11-22 MED ORDER — LACTATED RINGERS IV BOLUS: 500.0000 mL | Freq: Once | INTRAVENOUS | Status: AC

## 2024-11-22 NOTE — ED Provider Notes (Signed)
 12:15 AM Patient transferred from [facility] for completion of MRI for the evaluation of headache and dizziness.  Had a negative CT venogram prior to transfer.  Also with negative CT noncontrasted head CT yesterday.  MRI with and without contrast shows no acute abnormality.  No findings concerning for PRES, CVA, mass, or demyelinating process.  Patient continues to complain of dizziness.  She states that every time she opens her eyes the room is spinning.  This causes her to feel nauseous.  She has not had any symptomatic improvement with Reglan , Benadryl .  She was also given Toradol ; states that her headache has lessened some.  Compazine  ordered for additional symptom management.  Will consult with neurology to discuss further recommendations.  Patient may necessitate admission given how symptomatic she remains despite medical management.  I did attempt a neurologic examination of the patient; however, she is quite resistant to this due to degree of her symptoms. Unable to confirm nystagmus noted by prior provider.  12:23 AM Dr. Merrianne of Neurology agrees with Compazine . Would add magnesium  sulfate infusion. This has been ordered. Will monitor.  2:48 AM Patient feeling better after Mg and compazine . Headache is gone. She is now willing to open her eyes; I don't appreciate any vertical nystagmus. EOMs intact.   3:50 AM Case discussed with Dr. Lindzen who has reviewed the patient's MRI. No acute findings noted; agree with radiologist interpretation. Dr. Merrianne comfortable with outpatient neurology evaluation. Recommends adequate hydration, nightly sleep, as well as neck exercises. Instructions attached for patient. Patient given Rx for compazine  for outpatient symptom control PRN.  Results for orders placed or performed during the hospital encounter of 11/21/24  Lipase, blood   Collection Time: 11/21/24  3:20 PM  Result Value Ref Range   Lipase 20 11 - 51 U/L  Comprehensive  metabolic panel   Collection Time: 11/21/24  3:20 PM  Result Value Ref Range   Sodium 138 135 - 145 mmol/L   Potassium 3.7 3.5 - 5.1 mmol/L   Chloride 104 98 - 111 mmol/L   CO2 21 (L) 22 - 32 mmol/L   Glucose, Bld 126 (H) 70 - 99 mg/dL   BUN 13 6 - 20 mg/dL   Creatinine, Ser 9.50 0.44 - 1.00 mg/dL   Calcium  8.8 (L) 8.9 - 10.3 mg/dL   Total Protein 7.7 6.5 - 8.1 g/dL   Albumin 4.2 3.5 - 5.0 g/dL   AST 24 15 - 41 U/L   ALT 22 0 - 44 U/L   Alkaline Phosphatase 93 38 - 126 U/L   Total Bilirubin 0.6 0.0 - 1.2 mg/dL   GFR, Estimated >39 >39 mL/min   Anion gap 13 5 - 15  CBC   Collection Time: 11/21/24  3:20 PM  Result Value Ref Range   WBC 7.4 4.0 - 10.5 K/uL   RBC 5.03 3.87 - 5.11 MIL/uL   Hemoglobin 9.7 (L) 12.0 - 15.0 g/dL   HCT 66.9 (L) 63.9 - 53.9 %   MCV 65.6 (L) 80.0 - 100.0 fL   MCH 19.3 (L) 26.0 - 34.0 pg   MCHC 29.4 (L) 30.0 - 36.0 g/dL   RDW 79.1 (H) 88.4 - 84.4 %   Platelets 486 (H) 150 - 400 K/uL   nRBC 0.0 0.0 - 0.2 %  CBG monitoring, ED   Collection Time: 11/21/24  3:21 PM  Result Value Ref Range   Glucose-Capillary 113 (H) 70 - 99 mg/dL  Urinalysis, Routine w reflex microscopic -Urine, Clean Catch  Collection Time: 11/21/24  4:55 PM  Result Value Ref Range   Color, Urine YELLOW YELLOW   APPearance CLEAR CLEAR   Specific Gravity, Urine 1.023 1.005 - 1.030   pH 6.0 5.0 - 8.0   Glucose, UA NEGATIVE NEGATIVE mg/dL   Hgb urine dipstick NEGATIVE NEGATIVE   Bilirubin Urine NEGATIVE NEGATIVE   Ketones, ur NEGATIVE NEGATIVE mg/dL   Protein, ur NEGATIVE NEGATIVE mg/dL   Nitrite NEGATIVE NEGATIVE   Leukocytes,Ua NEGATIVE NEGATIVE  Pregnancy, urine   Collection Time: 11/21/24  4:55 PM  Result Value Ref Range   Preg Test, Ur NEGATIVE NEGATIVE  Sedimentation rate   Collection Time: 11/21/24  6:37 PM  Result Value Ref Range   Sed Rate 17 0 - 22 mm/hr   MR Brain W and Wo Contrast Result Date: 11/21/2024 EXAM: MRI BRAIN WITH CONTRAST 11/21/2024 11:02:26 PM  TECHNIQUE: Multiplanar multisequence MRI of the head/brain was performed with the administration of intravenous contrast. COMPARISON: None available. CLINICAL HISTORY: Headache, increasing frequency or severity FINDINGS: BRAIN AND VENTRICLES: No acute infarct. No acute intracranial hemorrhage. No mass effect or midline shift. No hydrocephalus. The sella is unremarkable. Normal flow voids. No mass or abnormal enhancement. ORBITS: No acute abnormality. SINUSES: No acute abnormality. BONES AND SOFT TISSUES: Normal bone marrow signal and enhancement. No acute soft tissue abnormality. IMPRESSION: 1. Normal brain MRI. Electronically signed by: Franky Stanford MD 11/21/2024 11:37 PM EST RP Workstation: [id number]   CT VENOGRAM HEAD Result Date: 11/21/2024 EXAM: CT VENOGRAM WITH CONTRAST 11/21/2024 07:36:59 PM TECHNIQUE: CT venogram of the head/brain was performed with the administration of 75 mL iohexol  (OMNIPAQUE ) 350 MG/ML injection. Multiplanar reformatted images are provided for review. MIP images are provided for review. Automated exposure control, iterative reconstruction, and/or weight based adjustment of the mA/kV was utilized to reduce the radiation dose to as low as reasonably achievable. COMPARISON: None available. CLINICAL HISTORY: Headache, increasing frequency or severity. FINDINGS: BRAIN/VENTRICLES: No acute intracranial hemorrhage. No extra axial fluid collection. Gray-white differentiation is maintained. No mass effect or midline shift. No hydrocephalus. ORBITS: No acute abnormality. SINUSES AND MASTOIDS: No acute abnormality. SOFT TISSUES AND SKULL: No acute abnormality. CT VENOGRAM: No dural venous sinus thrombosis. No significant stenosis. IMPRESSION: 1. No dural venous sinus thrombosis. 2. No acute intracranial abnormality. Electronically signed by: Franky Stanford MD 11/21/2024 07:48 PM EST RP Workstation: [id number]   DG Chest 2 View Result Date: 11/20/2024 EXAM: 2 VIEW(S) XRAY OF THE CHEST  11/20/2024 07:40:00 PM COMPARISON: Comparison study, 09/19/2020. CLINICAL HISTORY: Chest pain. FINDINGS: LUNGS AND PLEURA: No focal pulmonary opacity. No pleural effusion. No pneumothorax. HEART AND MEDIASTINUM: No acute abnormality of the cardiac and mediastinal silhouettes. BONES AND SOFT TISSUES: No acute osseous abnormality. IMPRESSION: 1. No acute cardiopulmonary process. Electronically signed by: Oneil Devonshire MD 11/20/2024 07:52 PM EST RP Workstation: [id number]   CT Head Wo Contrast Result Date: 11/20/2024 EXAM: CT HEAD WITHOUT 11/20/2024 07:37:19 PM TECHNIQUE: CT of the head was performed without the administration of intravenous contrast. Automated exposure control, iterative reconstruction, and/or weight based adjustment of the mA/kV was utilized to reduce the radiation dose to as low as reasonably achievable. COMPARISON: head CT 08/18/2014 CLINICAL HISTORY: Headache, increasing frequency or severity FINDINGS: BRAIN AND VENTRICLES: There is no evidence of an acute infarct, intracranial hemorrhage, mass, midline shift, hydrocephalus, or extra-axial fluid collection. Cerebral volume is normal. A partially empty sella is noted. ORBITS: No acute abnormality. SINUSES AND MASTOIDS: No acute abnormality. SOFT TISSUES AND SKULL: No acute skull fracture.  No acute soft tissue abnormality. IMPRESSION: 1. No acute intracranial abnormality. 2. Partially empty sella, often reflecting normal anatomic variation though can also be seen with idiopathic intracranial hypertension. Electronically signed by: Dasie Hamburg MD 11/20/2024 07:46 PM EST RP Workstation: [id number]      Keith Sor, PA-C 11/22/24 [id number]    Palumbo, April, MD 11/22/24 [id number]

## 2024-11-22 NOTE — Discharge Instructions (Addendum)
 Le recomendamos que coma con regularidad y beba muchos lquidos. Duerma lo suficiente por la noche. Tome los medicamentos recetados diariamente. Si el mareo o las nuseas persisten, puede probar con Compazine  segn lo indicado por su mdico. Los ejercicios para el cuello pueden ser tiles; consulte las instrucciones adjuntas. Acuda a la consulta de Neurologa para una evaluacin ms completa de sus sntomas.  We recommend that you eat regular meals and drink plenty of fluids. Get plenty of sleep at night. Take your daily prescribed medications. You may try compazine  as prescribed if dizziness and/or nausea persist. Neck exercises may be helpful; see the attached instructions for this. Follow up with Neurology for further evaluation of your symptoms.

## 2024-11-26 ENCOUNTER — Ambulatory Visit: Payer: Self-pay

## 2024-11-26 NOTE — Telephone Encounter (Signed)
 FYI Only or Action Required?: FYI only for provider: appointment scheduled on 1.15.26- scheduled as new pt. Pt advised to go to ER if symptoms persist.  Patient was last seen in primary care on 12/27/2021 by Jennifer Holmes, Jennifer Holmes, Jennifer Holmes.  Called Nurse Triage reporting Dizziness and Shortness of Breath.  Symptoms began a week ago.  Interventions attempted: Prescription medications: compazine  newest medication.  Symptoms are: gradually improving.  Triage Disposition: See Physician Within 24 Hours, See HCP Within 4 Hours (Or PCP Triage)  Patient/caregiver understands and will follow disposition?: Yes   Copied from CRM [id number]. Topic: Clinical - Red Word Triage >> Nov 26, 2024 12:46 PM Hadassah PARAS wrote: Red Word that prompted transfer to Nurse Triage: Pt was hospitalized12/13. Pt passed out yesterday night. Pt is continuing to feel dizzy. Blood pressure is at 160. Transferring to NT Reason for Disposition  [1] MILD difficulty breathing (e.g., minimal/no SOB at rest, SOB with walking, pulse < 100) AND [2] NEW-onset or WORSE than normal  Vomiting occurs with dizziness  Answer Assessment - Initial Assessment Questions Pt called because she is still having symptoms and was told to follow up with her regular doctor. States ER told her BP is high she has history of that so needs to follow up. Last night she passed out and her husband called 911. Pt is unsure if she hit her head. Pt declined ER at that time. She states she is having dizziness and feels better today. It'Holmes been all the time. Today it is with walking. She denies any other symptoms with the dizziness currently. Rn also noticed she sounded short of breath. She states that has been that way for the last 3 weeks and is only if she is talking. If she talks for long periods of time it gets better. Denies any shortness of breath with exertion or any other time. As RN was going to schedule pt, realized pt has not been seen by PCP in over 3 years. RN advised  her old office is not accepting new pts and since it has been over 3 years it'Holmes considered new pt. Gave pt a couple of options. She chose the appt date further away with the provider who was closer. RN advised if symptoms continue to go to the ER to be re-evalutated. Pt stated understanding.      1. DESCRIPTION: Describe your dizziness.     Room spinning 2. VERTIGO: Do you feel like either you or the room is spinning or tilting?     yes 3. LIGHTHEADED: Do you feel lightheaded? (e.g., somewhat faint, woozy, weak upon standing)     no 4. SEVERITY: How bad is it?  Can you walk?     Yes  5. ONSET:  When did the dizziness begin?     Been on going, seen in ER a couple of times 6. AGGRAVATING FACTORS: Does anything make it worse? (e.g., standing, change in head position)     Standing up makes it worse 7. CAUSE: What do you think is causing the dizziness?     unknown 8. RECURRENT SYMPTOM: Have you had dizziness before? If Yes, ask: When was the last time? What happened that time?     Yes, ER and neuro consult 9. OTHER SYMPTOMS: Do you have any other symptoms? (e.g., earache, headache, numbness, tinnitus, vomiting, weakness)     When they happen, weakness, vomiting, headache- none of these currently  10. PREGNANCY: Is there any chance you are pregnant? When was your last  menstrual period?       States currently on period  Answer Assessment - Initial Assessment Questions 1. RESPIRATORY STATUS: Describe your breathing? (e.g., wheezing, shortness of breath, unable to speak, severe coughing)      Shortness of breath 2. ONSET: When did this breathing problem begin?      About 3 weeks 3. PATTERN Does the difficult breathing come and go, or has it been constant since it started?      Intermittent only with talking 4. SEVERITY: How bad is your breathing? (e.g., mild, moderate, severe)      Mild to moderate 5. RECURRENT SYMPTOM: Have you had difficulty breathing  before? If Yes, ask: When was the last time? and What happened that time?      no 6. CARDIAC HISTORY: Do you have any history of heart disease? (e.g., heart attack, angina, bypass surgery, angioplasty)      htn 7. LUNG HISTORY: Do you have any history of lung disease?  (e.g., pulmonary embolus, asthma, emphysema)     no 8. CAUSE: What do you think is causing the breathing problem?      unknown 9. OTHER SYMPTOMS: Do you have any other symptoms? (e.g., chest pain, cough, dizziness, fever, runny nose)     dizziness 10. O2 SATURATION MONITOR:  Do you use an oxygen saturation monitor (pulse oximeter) at home? If Yes, ask: What is your reading (oxygen level) today? What is your usual oxygen saturation reading? (e.g., 95%)  Protocols used: Breathing Difficulty-A-AH, Dizziness - Vertigo-A-AH

## 2024-12-21 ENCOUNTER — Ambulatory Visit
Admission: EM | Admit: 2024-12-21 | Discharge: 2024-12-21 | Disposition: A | Discharge status: Home / Self Care | Attending: Family Medicine | Admitting: Family Medicine

## 2024-12-21 DIAGNOSIS — N760 Acute vaginitis: Secondary | ICD-10-CM | POA: Insufficient documentation

## 2024-12-21 DIAGNOSIS — R42 Dizziness and giddiness: Secondary | ICD-10-CM | POA: Diagnosis not present

## 2024-12-21 DIAGNOSIS — R300 Dysuria: Secondary | ICD-10-CM | POA: Insufficient documentation

## 2024-12-21 LAB — POCT URINE DIPSTICK
Bilirubin, UA: NEGATIVE
Blood, UA: NEGATIVE
Glucose, UA: NEGATIVE mg/dL
Ketones, POC UA: NEGATIVE mg/dL
Leukocytes, UA: NEGATIVE
Nitrite, UA: NEGATIVE
POC PROTEIN,UA: NEGATIVE
Spec Grav, UA: 1.02
Urobilinogen, UA: 0.2 U/dL
pH, UA: 6

## 2024-12-21 LAB — POCT URINE PREGNANCY: Preg Test, Ur: NEGATIVE

## 2024-12-21 MED ORDER — FLUCONAZOLE 150 MG PO TABS: 150.0000 mg | ORAL_TABLET | ORAL | 0 refills | Status: DC

## 2024-12-21 MED ORDER — MECLIZINE HCL 25 MG PO TABS: 25.0000 mg | ORAL_TABLET | Freq: Three times a day (TID) | ORAL | 0 refills | Status: AC | PRN

## 2024-12-21 NOTE — ED Triage Notes (Signed)
 Pt states that she has some pain with urination and vaginal itching. X2 weeks   Pt states that she has used otc vaginal creams but it doesn't help.

## 2024-12-21 NOTE — ED Provider Notes (Signed)
 " [facility]  Note:  This document was prepared using Conservation officer, historic buildings and may include unintentional dictation errors.  MRN: [id number] DOB: [birth year]  Subjective:   Jennifer Holmes is a 39 y.o. female presenting for 2-week history of vaginal irritation, vaginal itching, vaginal pain, intermittent mild dysuria.  Patient reports history of frequent yeast infections.  Would like to be treated for this.  No concern for sexually transmitted infection.  Denies fever, n/v, abdominal pain, pelvic pain, rashes, urinary frequency, hematuria, vaginal discharge.  Declines STI testing.   Current Outpatient Medications  Medication Instructions   acetaminophen  (TYLENOL ) 650 mg, Oral, Every 6 hours PRN   amoxicillin  (AMOXIL ) 875 mg, Oral, 2 times daily   azelastine  (OPTIVAR ) 0.05 % ophthalmic solution 1 drop, Both Eyes, 2 times daily   cetirizine  (ZYRTEC  ALLERGY) 10 mg, Oral, Daily   erythromycin  ophthalmic ointment Both Eyes, 4 times daily   lisinopril -hydrochlorothiazide  (ZESTORETIC ) 10-12.5 MG tablet 1 tablet, Oral, Daily   meclizine  (ANTIVERT ) 25 mg, Oral, 3 times daily PRN   prochlorperazine  (COMPAZINE ) 10 mg, Oral, 2 times daily PRN    Allergies[1]  Past Medical History:  Diagnosis Date   Gastroesophageal Reflux Disease (GERD)    Headache    HELLP syndrome    History of blood transfusion for PP hemorrhage    Postoperative Nausea and Vomiting (PONV)    Preterm labor      Past Surgical History:  Procedure Laterality Date   CESAREAN SECTION N/A 03/01/2008    Family History  Problem Relation Age of Onset   Hypertension Mother    Heart disease Mother    Hypertension Brother     Social History   Occupational History   Not on file  Tobacco Use   Smoking status: Never   Smokeless tobacco: Never  Vaping Use   Vaping status: Never Used  Substance and Sexual Activity   Alcohol use: No   Drug use: No   Sexual activity: Yes     Birth control/protection: None     ROS   Objective:   Vitals: BP 121/89 (BP Location: Left Arm)   Pulse 68   Temp 97.9 F (36.6 C) (Oral)   Resp 18   Wt 200 lb (90.7 kg)   LMP 11/27/2024   SpO2 98%   BMI 37.79 kg/m   Physical Exam Constitutional:      General: She is not in acute distress.    Appearance: Normal appearance. She is well-developed. She is not ill-appearing, toxic-appearing or diaphoretic.  HENT:     Head: Normocephalic and atraumatic.     Right Ear: Tympanic membrane, ear canal and external ear normal. No tenderness. There is no impacted cerumen. Tympanic membrane is not injected, perforated, erythematous or bulging.     Left Ear: Tympanic membrane, ear canal and external ear normal. No tenderness. There is no impacted cerumen. Tympanic membrane is not injected, perforated, erythematous or bulging.     Nose: Nose normal.     Mouth/Throat:     Mouth: Mucous membranes are moist.  Eyes:     General: No scleral icterus.       Right eye: No discharge.        Left eye: No discharge.     Extraocular Movements: Extraocular movements intact.     Conjunctiva/sclera: Conjunctivae normal.  Cardiovascular:     Rate and Rhythm: Normal rate.  Pulmonary:     Effort: Pulmonary effort is normal.  Abdominal:  General: Bowel sounds are normal. There is no distension.     Palpations: Abdomen is soft. There is no mass.     Tenderness: There is no abdominal tenderness. There is no right CVA tenderness, left CVA tenderness, guarding or rebound.  Skin:    General: Skin is warm and dry.  Neurological:     General: No focal deficit present.     Mental Status: She is alert and oriented to person, place, and time.     Cranial Nerves: No cranial nerve deficit.     Motor: No weakness.     Coordination: Coordination normal.     Gait: Gait normal.  Psychiatric:        Mood and Affect: Mood normal.        Behavior: Behavior normal.        Thought Content: Thought content  normal.        Judgment: Judgment normal.     Results for orders placed or performed during the hospital encounter of 12/21/24 (from the past 24 hours)  POCT URINE DIPSTICK     Status: None   Collection Time: 12/21/24  2:00 PM  Result Value Ref Range   Color, UA yellow yellow   Clarity, UA clear clear   Glucose, UA negative negative mg/dL   Bilirubin, UA negative negative   Ketones, POC UA negative negative mg/dL   Spec Grav, UA 8.979 8.989 - 1.025   Blood, UA negative negative   pH, UA 6.0 5.0 - 8.0   POC PROTEIN,UA negative negative, trace   Urobilinogen, UA 0.2 0.2 or 1.0 E.U./dL   Nitrite, UA Negative Negative   Leukocytes, UA Negative Negative  POCT urine pregnancy     Status: None   Collection Time: 12/21/24  2:00 PM  Result Value Ref Range   Preg Test, Ur Negative Negative    Assessment and Plan :   PDMP not reviewed this encounter.  1. Acute vaginitis   2. Dysuria   3. Vertigo      Start fluconazole  to help with vaginitis. Labs pending. Counseled patient on potential for adverse effects with medications prescribed/recommended today, ER and return-to-clinic precautions discussed, patient verbalized understanding.   At discharge, patient requested review of her test results. Has ongoing vertigo symptoms. Responds to meclizine  but is out of her medication now. Has had extensive testing but no definitive answers.  Has an appointment pending with a specialist and also for an MRI. Reviewed labs with patient and recommended starting iron supplementation.  Unfortunately I cannot pursue more testing and has not really been done and it is extensive.  Recommend maintaining the plan to follow-up with a specialist, her PCP and pursue the MRI as planned.  Refill provided for meclizine .  Exam updated.     [1]  Allergies Allergen Reactions   Latex Itching   Lactose Intolerance (Gi) Other (See Comments)    Reaction:  GI upset     Christopher Savannah, PA-C 12/21/24 1441  "

## 2024-12-22 ENCOUNTER — Ambulatory Visit (HOSPITAL_COMMUNITY): Payer: Self-pay

## 2024-12-22 LAB — URINE CULTURE: Culture: NO GROWTH

## 2024-12-22 LAB — CERVICOVAGINAL ANCILLARY ONLY
Bacterial Vaginitis (gardnerella): POSITIVE — AB
Candida Glabrata: NEGATIVE
Candida Vaginitis: POSITIVE — AB
Comment: NEGATIVE
Comment: NEGATIVE
Comment: NEGATIVE

## 2024-12-22 MED ORDER — METRONIDAZOLE 500 MG PO TABS: 500.0000 mg | ORAL_TABLET | Freq: Two times a day (BID) | ORAL | 0 refills | Status: AC

## 2024-12-24 ENCOUNTER — Ambulatory Visit: Admitting: Medical

## 2024-12-24 VITALS — BP 120/80 | HR 84 | Temp 97.5°F | Ht 61.0 in | Wt 200.6 lb

## 2024-12-24 DIAGNOSIS — R7309 Other abnormal glucose: Secondary | ICD-10-CM

## 2024-12-24 DIAGNOSIS — D649 Anemia, unspecified: Secondary | ICD-10-CM | POA: Diagnosis not present

## 2024-12-24 DIAGNOSIS — N920 Excessive and frequent menstruation with regular cycle: Secondary | ICD-10-CM

## 2024-12-24 DIAGNOSIS — R9431 Abnormal electrocardiogram [ECG] [EKG]: Secondary | ICD-10-CM

## 2024-12-24 DIAGNOSIS — Z8249 Family history of ischemic heart disease and other diseases of the circulatory system: Secondary | ICD-10-CM | POA: Diagnosis not present

## 2024-12-24 DIAGNOSIS — R42 Dizziness and giddiness: Secondary | ICD-10-CM

## 2024-12-24 DIAGNOSIS — R930 Abnormal findings on diagnostic imaging of skull and head, not elsewhere classified: Secondary | ICD-10-CM

## 2024-12-24 MED ORDER — FERROUS GLUCONATE 324 (38 FE) MG PO TABS: 324.0000 mg | ORAL_TABLET | Freq: Two times a day (BID) | ORAL | 2 refills | Status: AC

## 2024-12-24 NOTE — Progress Notes (Signed)
 "    Name: Jennifer Holmes   Date of Visit: 12/24/24   CHIEF COMPLAINT:  Chief Complaint  Patient presents with   Hospitalization Follow-up    Hospital follow-up. Seen 4 times in the last month at [facility] and ER. Sleeping a lot and doesn't feel she is well rested.        HPI:  Discussed the use of AI scribe software for clinical note transcription with the patient, who gave verbal consent to proceed.  History of Present Illness  Jennifer Holmes is a 39 year old female with hypertension who presents with dizziness and syncope, hospital follow up, new patient.    She experiences dizziness described as 'spinning everything around' and syncope while cooking, which led to a fall in her kitchen. She does not recall the exact date of the incident, but about a month ago.   Was evaluated at the hospital.  Meclizine  is helping vertigo.  She has a history of hypertension and has been on blood pressure medication intermittently throughout her life. She recently resumed medication after a lapse in treatment and is currently taking medication for blood pressure, dizziness, and a fungal infection. The blood pressure medication is effective.  She has a history of anemia, with recent blood work at the hospital indicating low blood counts. She experiences heavy menstrual periods, using seven to eight pads per day, and has had anemia since menarche. She has not taken iron supplements since moving to the [place] , although she did take them in her youth in her home country. She reports fatigue, excessive sleep, and low energy, stating she sleeps 'all day, all night' and only wakes to use the bathroom or eat.  She describes episodes of dizziness when performing activities such as sweeping the floor, accompanied by shortness of breath. She also reports a sensation of being pushed into a pool, causing fear and disorientation, and experiences crying due to an inability to control the spinning  sensation.  She has a family history of heart disease, with her mother and youngest brother having a stent placed and he is younger than her. 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 She has an upcoming appointment with a neurologist in April for further evaluation.  She works night shifts and her family believes this may contribute to her health issues, including anemia. She has stopped working due to her current health condition.  Lots of hypertension in the family.  No other aggravating or relieving factors. No other complaint.   Past Medical History:  Diagnosis Date   Gastroesophageal Reflux Disease (GERD)    Headache    HELLP syndrome    History of blood transfusion for PP hemorrhage    Postoperative Nausea and Vomiting (PONV)    Preterm labor    Medications Ordered Prior to Encounter[1]   OBJECTIVE:    BP 120/80   Pulse 84   Temp (!) 97.5 F (36.4 C)   Ht 5' 1 (1.549 m)   Wt 200 lb 9.6 oz (91 kg)   LMP 11/27/2024   SpO2 99%   BMI 37.90 kg/m    General appearence: alert, no distress, WD/WN, hispanic female HEENT: normocephalic, sclerae anicteric, PERRLA, EOMi, nares patent, no discharge or erythema, pharynx normal Oral cavity: MMM, no lesions Neck: supple, no lymphadenopathy, no thyromegaly, no masses, no bruits Heart: RRR, normal S1, S2,  no murmurs Lungs: CTA bilaterally, no wheezes, rhonchi, or rales Abdomen: +bs, soft, mild central lower abdominal tenderness, otherwise non tender, non distended, no masses, no hepatomegaly, no splenomegaly Extremities: no edema, no cyanosis, no clubbing Pulses: 2+ symmetric, upper and lower extremities, normal cap refill    MR brain w/wo contrast 12/19/24 IMPRESSION: 1. Normal brain MRI.  CT head without contrast 11/20/24 IMPRESSION: 1. No acute intracranial abnormality. 2. Partially  empty sella, often reflecting normal anatomic variation though can also be seen with idiopathic intracranial hypertension.   ASSESSMENT/PLAN:   Encounter Diagnoses  Name Primary?   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 Blood counts indicate significant anemia. - Ordered blood tests to check iron levels. - Recommended iron supplementation. Begin fergon prescription twice daily.  However if this causes constipation, can change to once daily.  Vertigo with syncope Episodes of vertigo with syncope. Meclizine  provided relief but causes drowsiness. Differential includes vertigo and anemia-related symptoms. - Continue meclizine , half tablet twice a day for a few days. -if not much improved in 3-5 days, call or recheck  Hypertension Long-standing hypertension, well-controlled with medication recently added. - Continue current antihypertensive medication Lisinopril  HCT 10/12.5 mg daily  Abnormal EKG, dizziness, change in EKG from prior Abnormal EKG noted. Family history of cardiovascular disease. No prior cardiology evaluation. - Referred to cardiologist for further evaluation.  Abnormal glucose Abnormal glucose levels noted. Screening for diabetes is warranted. - Ordered diabetes screening.  Headache, hx/o facial numbness Head imaging at hospital recently with some abnormality possibly suggesting idiopathic intracranial hypertension - Continue with neurology appointment coming up   Jennifer Holmes was seen today for hospitalization follow-up.  Diagnoses and all orders for this visit:  Chronic anemia -     CBC with Differential/Platelet -     Iron, TIBC and Ferritin Panel -     Vitamin B12 -     Folate  Vertigo  Dizziness -     Basic metabolic  panel with GFR -     TSH -     Ambulatory referral to Cardiology  Elevated glucose -     Hemoglobin A1c  Menorrhagia with regular cycle  Abnormal CT of the head  Abnormal EKG -     Ambulatory referral to Cardiology  Family history of premature CAD -     Ambulatory referral to Cardiology  Other orders -     ferrous gluconate  (FERGON) 324 MG tablet; Take 1 tablet (324 mg total) by mouth 2 (two) times daily with a meal.   F/u pending labs, referral  [facility]    [1]  Current Outpatient Medications on File Prior to Visit  Medication Sig Dispense Refill   acetaminophen  (TYLENOL ) 325 MG tablet Take 2 tablets (650 mg total) by mouth every 6 (six) hours as needed for moderate pain. 30 tablet 0   fluconazole  (DIFLUCAN ) 150 MG tablet Take 1 tablet (150 mg total) by mouth every 3 (three) days. 2 tablet 0   lisinopril -hydrochlorothiazide  (ZESTORETIC ) 10-12.5 MG tablet Take 1 tablet by mouth daily. 90 tablet 0   meclizine  (ANTIVERT ) 25 MG tablet Take 1 tablet (25 mg total) by mouth 3 (three) times daily  as needed for dizziness. 30 tablet 0   metroNIDAZOLE  (FLAGYL ) 500 MG tablet Take 1 tablet (500 mg total) by mouth 2 (two) times daily for 7 days. 14 tablet 0   No current facility-administered medications on file prior to visit.   "

## 2024-12-24 NOTE — Patient Instructions (Signed)
 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 Blood counts indicate significant anemia. - Ordered blood tests to check iron levels. - Recommended iron supplementation. Begin fergon prescription twice daily.  However if this causes constipation, can change to once daily.  Vertigo with syncope Episodes of vertigo with syncope. Meclizine  provided relief but causes drowsiness. Differential includes vertigo and anemia-related symptoms. - Continue meclizine , half tablet twice a day for a few days. -if not much improved in 3-5 days, call or recheck  Hypertension Long-standing hypertension, well-controlled with medication recently added. - Continue current antihypertensive medication Lisinopril  HCT 10/12.5 mg daily  Abnormal EKG, dizziness, change in EKG from prior Abnormal EKG noted. Family history of cardiovascular disease. No prior cardiology evaluation. - Referred to cardiologist for further evaluation.  Abnormal glucose Abnormal glucose levels noted. Screening for diabetes is warranted. - Ordered diabetes screening.  Headache, hx/o facial numbness Head imaging at hospital recently with some abnormality possibly suggesting idiopathic intracranial hypertension - Continue with neurology appointment coming up

## 2024-12-25 ENCOUNTER — Ambulatory Visit: Payer: Self-pay | Admitting: Medical

## 2024-12-25 ENCOUNTER — Other Ambulatory Visit: Payer: Self-pay | Admitting: Medical

## 2024-12-25 LAB — BASIC METABOLIC PANEL WITH GFR
BUN/Creatinine Ratio: 17 (ref 9–23)
BUN: 9 mg/dL (ref 6–20)
CO2: 24 mmol/L (ref 20–29)
Calcium: 9.4 mg/dL (ref 8.7–10.2)
Chloride: 102 mmol/L (ref 96–106)
Creatinine, Ser: 0.52 mg/dL — AB (ref 0.57–1.00)
Glucose: 82 mg/dL (ref 70–99)
Potassium: 4.8 mmol/L (ref 3.5–5.2)
Sodium: 139 mmol/L (ref 134–144)
eGFR: 123 mL/min/1.73

## 2024-12-25 LAB — CBC WITH DIFFERENTIAL/PLATELET
Basophils Absolute: 0.1 x10E3/uL (ref 0.0–0.2)
Basos: 1 %
EOS (ABSOLUTE): 0.2 x10E3/uL (ref 0.0–0.4)
Eos: 4 %
Hematocrit: 34.4 % (ref 34.0–46.6)
Hemoglobin: 10.1 g/dL — ABNORMAL LOW (ref 11.1–15.9)
Immature Grans (Abs): 0 x10E3/uL (ref 0.0–0.1)
Immature Granulocytes: 0 %
Lymphocytes Absolute: 2.2 x10E3/uL (ref 0.7–3.1)
Lymphs: 40 %
MCH: 20.6 pg — ABNORMAL LOW (ref 26.6–33.0)
MCHC: 29.4 g/dL — ABNORMAL LOW (ref 31.5–35.7)
MCV: 70 fL — ABNORMAL LOW (ref 79–97)
Monocytes Absolute: 0.5 x10E3/uL (ref 0.1–0.9)
Monocytes: 9 %
Neutrophils Absolute: 2.6 x10E3/uL (ref 1.4–7.0)
Neutrophils: 46 %
Platelets: 409 x10E3/uL (ref 150–450)
RBC: 4.91 x10E6/uL (ref 3.77–5.28)
RDW: 21.7 % — ABNORMAL HIGH (ref 11.7–15.4)
WBC: 5.6 x10E3/uL (ref 3.4–10.8)

## 2024-12-25 LAB — IRON,TIBC AND FERRITIN PANEL
Ferritin: 9 ng/mL — AB (ref 15–150)
Iron Saturation: 6 % — AB (ref 15–55)
Iron: 33 ug/dL (ref 27–159)
Total Iron Binding Capacity: 518 ug/dL — AB (ref 250–450)
UIBC: 485 ug/dL — AB (ref 131–425)

## 2024-12-25 LAB — TSH: TSH: 3.02 u[IU]/mL (ref 0.450–4.500)

## 2024-12-25 LAB — HEMOGLOBIN A1C
Est. average glucose Bld gHb Est-mCnc: 117 mg/dL
Hgb A1c MFr Bld: 5.7 % — ABNORMAL HIGH (ref 4.8–5.6)

## 2024-12-25 LAB — VITAMIN B12: Vitamin B-12: 1109 pg/mL (ref 232–1245)

## 2024-12-25 LAB — FOLATE: Folate: 19.5 ng/mL

## 2024-12-25 NOTE — Progress Notes (Signed)
 "   Cardiology Clinic Note   Patient Name: Jennifer Holmes Date of Encounter: 12/28/2024  Primary Care Provider:  Bulah Alm RAMAN, PA-C Primary Cardiologist:  Emeline FORBES Calender, DO  Patient Profile    Jennifer Holmes 39 year old female presents to the clinic today for evaluation of her dizziness and abnormal EKG.  Past Medical History    Past Medical History:  Diagnosis Date   Gastroesophageal Reflux Disease (GERD)    Headache    HELLP syndrome    History of blood transfusion for PP hemorrhage    Postoperative Nausea and Vomiting (PONV)    Preterm labor    Past Surgical History:  Procedure Laterality Date   CESAREAN SECTION N/A 03/01/2008    Allergies  Allergies[1]  History of Present Illness    Jennifer Holmes has a PMH of GERD, HA, HTN, preterm labor, dizziness, syncope, and history of blood transfusion for postpartum hemorrhage.  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  She continues to have facial pulling and pain as well as involuntary mouth movements (following with neurology).    she was seen and evaluated by her PCP on 12/24/2024.  She reported she noticed dizziness and syncope while cooking.  This led to a fall in her kitchen.  She was unable to remember the exact date of the incident but indicated that it happened about 1 month ago.  She was evaluated at the hospital.  She noted that meclizine  was helping with her vertigo.  She had normal brain MRI 12/19/2024.  Head CT 11/20/2024 no acute intracranial abnormality.  On further evaluation she reported that she had a history of hypertension and had been on blood pressure medication intermittently throughout her life.  She reported at the time of the visit she was taking medication for her blood pressure.  She also noted a family history of heart disease with her mother and youngest brother having stents placed.  She was noted to have an abnormal EKG with changes from prior EKG.   With this along with her history of cardiovascular disease she was referred to cardiology for further evaluation.  She presents to the clinic today and states she notices intermittent episodes of chest pressure.  She notices this several times per month.  She reports that these coincide with extra heartbeats and fast heartbeats.  She notices these both with activity and at rest.  She also complains of a frontal sinus type headache that has been present since she presented to the emergency department.  We reviewed her head CT.  I reassured her that her head CT was unremarkable.  I will order cardiac event monitor and echocardiogram for further prognostication.  I encouraged her to increase the iron in her diet, and increase her fiber as well.  Will plan follow-up after testing.  She denies  shortness of breath, lower extremity edema, fatigue, melena, hematuria, hemoptysis, diaphoresis, weakness, presyncope, syncope, orthopnea, and PND.     Home Medications    Prior to Admission medications  Medication Sig Start Date End Date Taking? Authorizing Provider  acetaminophen  (TYLENOL ) 325 MG tablet Take 2 tablets (650 mg total) by mouth every 6 (six) hours as needed for moderate pain. 10/15/22   Christopher Savannah, PA-C  ferrous gluconate  (FERGON) 324 MG tablet Take 1 tablet (324 mg total) by mouth 2 (two) times daily with a meal. 12/24/24   Tysinger, Alm RAMAN, PA-C  fluconazole  (DIFLUCAN ) 150 MG tablet Take 1 tablet (150 mg total) by  mouth every 3 (three) days. 12/21/24   Christopher Savannah, PA-C  lisinopril -hydrochlorothiazide  (ZESTORETIC ) 10-12.5 MG tablet Take 1 tablet by mouth daily. 11/20/24   Neysa Thersia RAMAN, PA-C  meclizine  (ANTIVERT ) 25 MG tablet Take 1 tablet (25 mg total) by mouth 3 (three) times daily as needed for dizziness. 12/21/24   Christopher Savannah, PA-C  metroNIDAZOLE  (FLAGYL ) 500 MG tablet Take 1 tablet (500 mg total) by mouth 2 (two) times daily for 7 days. 12/22/24 12/29/24  Vonna Sharlet POUR, MD    Family  History    Family History  Problem Relation Age of Onset   Hypertension Mother    Heart disease Mother    Hypertension Brother    She indicated that the status of her mother is unknown. She indicated that the status of her brother is unknown.  Social History    Social History   Socioeconomic History   Marital status: Married    Spouse name: Not on file   Number of children: Not on file   Years of education: Not on file   Highest education level: Not on file  Occupational History   Not on file  Tobacco Use   Smoking status: Never   Smokeless tobacco: Never  Vaping Use   Vaping status: Never Used  Substance and Sexual Activity   Alcohol use: No   Drug use: No   Sexual activity: Yes    Birth control/protection: None  Other Topics Concern   Not on file  Social History Narrative   Not on file   Social Drivers of Health   Tobacco Use: Low Risk (12/21/2024)   Patient History    Smoking Tobacco Use: Never    Smokeless Tobacco Use: Never    Passive Exposure: Not on file  Financial Resource Strain: Not on file  Food Insecurity: Not on file  Transportation Needs: Not on file  Physical Activity: Not on file  Stress: Not on file  Social Connections: Not on file  Intimate Partner Violence: Not on file  Depression (PHQ2-9): Low Risk (12/24/2024)   Depression (PHQ2-9)    PHQ-2 Score: 0  Alcohol Screen: Not on file  Housing: Not on file  Utilities: Not on file  Health Literacy: Not on file     Review of Systems    General:  No chills, fever, night sweats or weight changes.  Cardiovascular:  No chest pain, dyspnea on exertion, edema, orthopnea, palpitations, paroxysmal nocturnal dyspnea. Dermatological: No rash, lesions/masses Respiratory: No cough, dyspnea Urologic: No hematuria, dysuria Abdominal:   No nausea, vomiting, diarrhea, bright red blood per rectum, melena, or hematemesis Neurologic:  No visual changes, wkns, changes in mental status. All other systems  reviewed and are otherwise negative except as noted above.  Physical Exam    VS:  BP 132/88   Pulse 78   Ht 5' 1 (1.549 m)   Wt 200 lb 1.9 oz (90.8 kg)   LMP 11/27/2024   SpO2 98%   BMI 37.81 kg/m  , BMI Body mass index is 37.81 kg/m. GEN: Well nourished, well developed, in no acute distress. HEENT: normal. Neck: Supple, no JVD, carotid bruits, or masses. Cardiac: RRR, no murmurs, rubs, or gallops. No clubbing, cyanosis, edema.  Radials/DP/PT 2+ and equal bilaterally.  Respiratory:  Respirations regular and unlabored, clear to auscultation bilaterally. GI: Soft, nontender, nondistended, BS + x 4. MS: no deformity or atrophy. Skin: warm and dry, no rash. Neuro:  Strength and sensation are intact. Psych: Normal affect.  Accessory Clinical  Findings    Recent Labs: 11/21/2024: ALT 22 12/24/2024: BUN 9; Creatinine, Ser 0.52; Hemoglobin 10.1; Platelets 409; Potassium 4.8; Sodium 139; TSH 3.020   Recent Lipid Panel    Component Value Date/Time   CHOL 167 01/21/2020 0928   TRIG 196 (H) 01/21/2020 0928   HDL 43 01/21/2020 0928   CHOLHDL 3.9 01/21/2020 0928   LDLCALC 91 01/21/2020 0928         ECG personally reviewed by me today-none today.          Assessment & Plan   1.  Significant family history of heart disease, cardiovascular risk evaluation-no chest pain today.  Denies exertional chest discomfort.  Blood pressure well-controlled.  Reports intermittent episodes of chest pressure.  She notices that these coincide with increased heart rate. Heart healthy diet Increase physical activity as tolerated Order 14-day cardiac event monitor Order echocardiogram  Essential hypertension-BP today 132/88. Maintain blood pressure log Continue lisinopril , hydrochlorothiazide   Vertigo, presyncope-improvement in symptoms with meclizine .  Seen and evaluated in the emergency department  and prescribed meclizine .  She denies bleeding issues but does have chronic  anemia. Maintain p.o. hydration Continue meclizine  Change positions slowly  History of facial paralysis-noted in 2020.  Reassuring recent imaging during emergency department visit. Following with neurology  Disposition: Follow-up with Dr.Segal or me in 2-3 months.   Jennifer HERO. Skyley Grandmaison NP-C     12/28/2024, 2:29 PM [facility] [address] [place], [place] [address] Office [phone]    Notice: This dictation was prepared with Dragon dictation along with smaller phrase technology. Any transcriptional errors that result from this process are unintentional and may not be corrected upon review.   I spent 14 minutes examining this patient, reviewing medications, and using patient centered shared decision making involving their cardiac care.   I spent  20 minutes reviewing past medical history,  medications, and prior cardiac tests.     [1]  Allergies Allergen Reactions   Latex Itching   Lactose Intolerance (Gi) Other (See Comments)    Reaction:  GI upset   "

## 2024-12-25 NOTE — Progress Notes (Signed)
 Results through MyChart

## 2024-12-28 ENCOUNTER — Other Ambulatory Visit: Payer: Self-pay | Admitting: General Practice

## 2024-12-28 ENCOUNTER — Ambulatory Visit: Attending: General Practice

## 2024-12-28 ENCOUNTER — Encounter: Payer: Self-pay | Admitting: General Practice

## 2024-12-28 ENCOUNTER — Ambulatory Visit: Attending: General Practice | Admitting: General Practice

## 2024-12-28 VITALS — BP 132/88 | HR 78 | Ht 61.0 in | Wt 200.1 lb

## 2024-12-28 DIAGNOSIS — Z8249 Family history of ischemic heart disease and other diseases of the circulatory system: Secondary | ICD-10-CM | POA: Diagnosis not present

## 2024-12-28 DIAGNOSIS — R0602 Shortness of breath: Secondary | ICD-10-CM

## 2024-12-28 DIAGNOSIS — R42 Dizziness and giddiness: Secondary | ICD-10-CM

## 2024-12-28 DIAGNOSIS — R002 Palpitations: Secondary | ICD-10-CM

## 2024-12-28 DIAGNOSIS — I10 Essential (primary) hypertension: Secondary | ICD-10-CM

## 2024-12-28 NOTE — Progress Notes (Unsigned)
 Enrolled for [employer] to mail a ZIO XT long term holter monitor to the patients address on file.   Dr. Kriste to read.

## 2024-12-28 NOTE — Patient Instructions (Addendum)
 Medication Instructions:  Your physician recommends that you continue on your current medications as directed. Please refer to the Current Medication list given to you today.  *If you need a refill on your cardiac medications before your next appointment, please call your pharmacy*  Lab Work: None ordered  If you have labs (blood work) drawn today and your tests are completely normal, you will receive your results only by: MyChart Message (if you have MyChart) OR A paper copy in the mail If you have any lab test that is abnormal or we need to change your treatment, we will call you to review the results.  Testing/Procedures: Your physician has requested that you have an echocardiogram. Echocardiography is a painless test that uses sound waves to create images of your heart. It provides your doctor with information about the size and shape of your heart and how well your hearts chambers and valves are working. This procedure takes approximately one hour. There are no restrictions for this procedure. Please do NOT wear cologne, perfume, aftershave, or lotions (deodorant is allowed). Please arrive 15 minutes prior to your appointment time.  Please note: We ask at that you not bring children with you during ultrasound (echo/ vascular) testing. Due to room size and safety concerns, children are not allowed in the ultrasound rooms during exams. Our front office staff cannot provide observation of children in our lobby area while testing is being conducted. An adult accompanying a patient to their appointment will only be allowed in the ultrasound room at the discretion of the ultrasound technician under special circumstances. We apologize for any inconvenience.   ZIO XT- Long Term Monitor Instructions  Your physician has requested you wear a ZIO patch monitor for 14 days.  This is a single patch monitor. [employer] supplies one patch monitor per enrollment. Additional stickers are not available.  Please do not apply patch if you will be having a Nuclear Stress Test,  Echocardiogram, Cardiac CT, MRI, or Chest Xray during the period you would be wearing the  monitor. The patch cannot be worn during these tests. You cannot remove and re-apply the  ZIO XT patch monitor.  Your ZIO patch monitor will be mailed 3 day USPS to your address on file. It may take 3-5 days  to receive your monitor after you have been enrolled.  Once you have received your monitor, please review the enclosed instructions. Your monitor  has already been registered assigning a specific monitor serial # to you.  Billing and Patient Assistance Program Information  We have supplied [employer] with any of your insurance information on file for billing purposes. [employer] offers a sliding scale Patient Assistance Program for patients that do not have  insurance, or whose insurance does not completely cover the cost of the ZIO monitor.  You must apply for the Patient Assistance Program to qualify for this discounted rate.  To apply, please call [employer] at [phone], select option 4, select option 2, ask to apply for  Patient Assistance Program. Meredeth will ask your household income, and how many people  are in your household. They will quote your out-of-pocket cost based on that information.  [employer] will also be able to set up a 90-month, interest-free payment plan if needed.  Applying the monitor   Shave hair from upper left chest.  Hold abrader disc by orange tab. Rub abrader in 40 strokes over the upper left chest as  indicated in your monitor instructions.  Clean area with 4 enclosed alcohol  pads. Let dry.  Apply patch as indicated in monitor instructions. Patch will be placed under collarbone on left  side of chest with arrow pointing upward.  Rub patch adhesive wings for 2 minutes. Remove white label marked 1. Remove the white  label marked 2. Rub patch adhesive wings for 2 additional minutes.  While  looking in a mirror, press and release button in center of patch. A small green light will  flash 3-4 times. This will be your only indicator that the monitor has been turned on.  Do not shower for the first 24 hours. You may shower after the first 24 hours.  Press the button if you feel a symptom. You will hear a small click. Record Date, Time and  Symptom in the Patient Logbook.  When you are ready to remove the patch, follow instructions on the last 2 pages of Patient  Logbook. Stick patch monitor onto the last page of Patient Logbook.  Place Patient Logbook in the blue and white box. Use locking tab on box and tape box closed  securely. The blue and white box has prepaid postage on it. Please place it in the mailbox as  soon as possible. Your physician should have your test results approximately 7 days after the  monitor has been mailed back to [facility].  Call [facility] Customer Care at [phone] if you have questions regarding  your ZIO XT patch monitor. Call them immediately if you see an orange light blinking on your  monitor.  If your monitor falls off in less than 4 days, contact our Monitor department at [phone].  If your monitor becomes loose or falls off after 4 days call [employer] at [phone] for  suggestions on securing your monitor   Follow-Up: At [facility], you and your health needs are our priority.  As part of our continuing mission to provide you with exceptional heart care, our providers are all part of one team.  This team includes your primary Cardiologist (physician) and Advanced Practice Providers or APPs (Physician Assistants and Nurse Practitioners) who all work together to provide you with the care you need, when you need it.  Your next appointment:   2 month(s)  Provider:   Emeline FORBES Calender, DO    We recommend signing up for the patient portal called MyChart.  Sign up information is provided on this After Visit Summary.   MyChart is used to connect with patients for Virtual Visits (Telemedicine).  Patients are able to view lab/test results, encounter notes, upcoming appointments, etc.  Non-urgent messages can be sent to your provider as well.   To learn more about what you can do with MyChart, go to [email or web address].   Other Instructions Increase your oral hydration   Take over the counter Fiber supplement

## 2024-12-31 ENCOUNTER — Other Ambulatory Visit: Payer: Self-pay | Admitting: General Practice

## 2024-12-31 DIAGNOSIS — I10 Essential (primary) hypertension: Secondary | ICD-10-CM

## 2024-12-31 DIAGNOSIS — Z8249 Family history of ischemic heart disease and other diseases of the circulatory system: Secondary | ICD-10-CM

## 2024-12-31 DIAGNOSIS — R0602 Shortness of breath: Secondary | ICD-10-CM

## 2024-12-31 DIAGNOSIS — R42 Dizziness and giddiness: Secondary | ICD-10-CM

## 2024-12-31 DIAGNOSIS — R002 Palpitations: Secondary | ICD-10-CM

## 2025-01-13 ENCOUNTER — Ambulatory Visit (HOSPITAL_BASED_OUTPATIENT_CLINIC_OR_DEPARTMENT_OTHER)
Admission: RE | Admit: 2025-01-13 | Discharge: 2025-01-13 | Disposition: A | Source: Ambulatory Visit | Attending: General Practice

## 2025-01-13 DIAGNOSIS — R002 Palpitations: Secondary | ICD-10-CM | POA: Diagnosis not present

## 2025-01-13 DIAGNOSIS — R0602 Shortness of breath: Secondary | ICD-10-CM | POA: Diagnosis not present

## 2025-01-13 DIAGNOSIS — Z8249 Family history of ischemic heart disease and other diseases of the circulatory system: Secondary | ICD-10-CM

## 2025-01-13 LAB — ECHOCARDIOGRAM COMPLETE
AR max vel: 1.87 cm2
AV Area VTI: 1.72 cm2
AV Area mean vel: 1.73 cm2
AV Mean grad: 4 mmHg
AV Peak grad: 7.4 mmHg
Ao pk vel: 1.36 m/s
Area-P 1/2: 3.42 cm2
Calc EF: 62.4 %
MV M vel: 1.44 m/s
MV Peak grad: 8.3 mmHg
S' Lateral: 2.4 cm
Single Plane A2C EF: 60.7 %
Single Plane A4C EF: 60.5 %

## 2025-01-14 ENCOUNTER — Ambulatory Visit: Payer: Self-pay | Admitting: General Practice

## 2025-02-23 ENCOUNTER — Ambulatory Visit: Admitting: Medical

## 2025-03-02 ENCOUNTER — Ambulatory Visit: Admitting: Internal Medicine

## 2025-03-23 ENCOUNTER — Ambulatory Visit: Admitting: Neurology
